# Patient Record
Sex: Male | Born: 1945 | Race: White | Hispanic: No | Marital: Married | Smoking: Never smoker
Health system: Southern US, Community
[De-identification: ages and names within clinical notes are randomized; demographics above are authoritative.]

## PROBLEM LIST (undated history)

## (undated) DIAGNOSIS — I10 Essential (primary) hypertension: Secondary | ICD-10-CM

## (undated) HISTORY — DX: Essential (primary) hypertension: I10

## (undated) HISTORY — PX: TONSILLECTOMY AND ADENOIDECTOMY: SHX28

---

## 2013-03-12 ENCOUNTER — Encounter: Payer: Self-pay | Admitting: General Practice

## 2013-03-12 ENCOUNTER — Ambulatory Visit (INDEPENDENT_AMBULATORY_CARE_PROVIDER_SITE_OTHER): Payer: Medicare Other | Admitting: General Practice

## 2013-03-12 VITALS — BP 119/75 | HR 99 | Temp 100.6°F | Ht 69.0 in | Wt 176.0 lb

## 2013-03-12 DIAGNOSIS — A088 Other specified intestinal infections: Secondary | ICD-10-CM | POA: Diagnosis not present

## 2013-03-12 DIAGNOSIS — A084 Viral intestinal infection, unspecified: Secondary | ICD-10-CM

## 2013-03-12 NOTE — Patient Instructions (Addendum)
Viral Gastroenteritis Viral gastroenteritis is also known as stomach flu. This condition affects the stomach and intestinal tract. It can cause sudden diarrhea and vomiting. The illness typically lasts 3 to 8 days. Most people develop an immune response that eventually gets rid of the virus. While this natural response develops, the virus can make you quite ill. CAUSES  Many different viruses can cause gastroenteritis, such as rotavirus or noroviruses. You can catch one of these viruses by consuming contaminated food or water. You may also catch a virus by sharing utensils or other personal items with an infected person or by touching a contaminated surface. SYMPTOMS  The most common symptoms are diarrhea and vomiting. These problems can cause a severe loss of body fluids (dehydration) and a body salt (electrolyte) imbalance. Other symptoms may include:  Fever.  Headache.  Fatigue.  Abdominal pain. DIAGNOSIS  Your caregiver can usually diagnose viral gastroenteritis based on your symptoms and a physical exam. A stool sample may also be taken to test for the presence of viruses or other infections. TREATMENT  This illness typically goes away on its own. Treatments are aimed at rehydration. The most serious cases of viral gastroenteritis involve vomiting so severely that you are not able to keep fluids down. In these cases, fluids must be given through an intravenous line (IV). HOME CARE INSTRUCTIONS   Drink enough fluids to keep your urine clear or pale yellow. Drink small amounts of fluids frequently and increase the amounts as tolerated.  Ask your caregiver for specific rehydration instructions.  Avoid:  Foods high in sugar.  Alcohol.  Carbonated drinks.  Tobacco.  Juice.  Caffeine drinks.  Extremely hot or cold fluids.  Fatty, greasy foods.  Too much intake of anything at one time.  Dairy products until 24 to 48 hours after diarrhea stops.  You may consume probiotics.  Probiotics are active cultures of beneficial bacteria. They may lessen the amount and number of diarrheal stools in adults. Probiotics can be found in yogurt with active cultures and in supplements.  Wash your hands well to avoid spreading the virus.  Only take over-the-counter or prescription medicines for pain, discomfort, or fever as directed by your caregiver. Do not give aspirin to children. Antidiarrheal medicines are not recommended.  Ask your caregiver if you should continue to take your regular prescribed and over-the-counter medicines.  Keep all follow-up appointments as directed by your caregiver. SEEK IMMEDIATE MEDICAL CARE IF:   You are unable to keep fluids down.  You do not urinate at least once every 6 to 8 hours.  You develop shortness of breath.  You notice blood in your stool or vomit. This may look like coffee grounds.  You have abdominal pain that increases or is concentrated in one small area (localized).  You have persistent vomiting or diarrhea.  You have a fever.  The patient is a child younger than 3 months, and he or she has a fever.  The patient is a child older than 3 months, and he or she has a fever and persistent symptoms.  The patient is a child older than 3 months, and he or she has a fever and symptoms suddenly get worse.  The patient is a baby, and he or she has no tears when crying. MAKE SURE YOU:   Understand these instructions.  Will watch your condition.  Will get help right away if you are not doing well or get worse. Document Released: 11/20/2005 Document Revised: 02/12/2012 Document Reviewed: 09/06/2011   ExitCare Patient Information 2013 [employer].  

## 2013-03-12 NOTE — Progress Notes (Signed)
  Subjective:    Patient ID: Daniel Osborne, male    DOB: [birth year], 67 y.o.   MRN: [id number]  Diarrhea  This is a new problem. The current episode started yesterday. The problem occurs 2 to 4 times per day. The problem has been unchanged. The stool consistency is described as watery. The patient states that diarrhea awakens him from sleep. Associated symptoms include chills. Pertinent negatives include no abdominal pain, fever, headaches, increased  flatus, myalgias, sweats or vomiting. Nothing aggravates the symptoms. Risk factors: neighbor has been ill with same symptoms, patient recently diagnosed with leukemia. He has tried nothing for the symptoms. There is no history of bowel resection, inflammatory bowel disease, irritable bowel syndrome or malabsorption.      Review of Systems  Constitutional: Positive for chills. Negative for fever.  Respiratory: Negative for chest tightness and shortness of breath.   Cardiovascular: Negative for chest pain and palpitations.  Gastrointestinal: Positive for diarrhea. Negative for vomiting, abdominal pain, constipation, blood in stool, abdominal distention and flatus.  Genitourinary: Negative for difficulty urinating.  Musculoskeletal: Negative.  Negative for myalgias.  Skin: Negative.   Neurological: Negative for dizziness and headaches.  Psychiatric/Behavioral: Negative.        Objective:   Physical Exam  Constitutional: He is oriented to person, place, and time. He appears well-developed and well-nourished. He appears distressed.  Cardiovascular: Normal rate, regular rhythm and normal heart sounds.   No murmur heard. Pulmonary/Chest: Effort normal and breath sounds normal. No respiratory distress. He exhibits no tenderness.  Abdominal: Soft. He exhibits no distension. Bowel sounds are increased. There is no tenderness. There is no rebound.  Neurological: He is alert and oriented to person, place, and time.  Skin: Skin is warm and dry.   Psychiatric: He has a normal mood and affect.          Assessment & Plan:  Discussed bland diet OTC imodium if diarrhea continues Increase fluid intake  Proper handwashing RTO if diarrhea continues more than 2 days Patient verbalized understanding and denies questions   Raymon Mutton, FNP-C

## 2013-07-07 ENCOUNTER — Telehealth: Payer: Self-pay | Admitting: *Deleted

## 2013-07-07 NOTE — Telephone Encounter (Signed)
Patient last seen in office on 03-22-13 for an acute visit. Last visit before that 2012. Please advise

## 2013-07-07 NOTE — Telephone Encounter (Signed)
I do not understand what this message  is about. Can you clarify this question.

## 2013-07-08 ENCOUNTER — Other Ambulatory Visit: Payer: Self-pay | Admitting: *Deleted

## 2013-07-08 MED ORDER — LISINOPRIL 10 MG PO TABS: 10.0000 mg | ORAL_TABLET | Freq: Every day | ORAL | Status: DC

## 2013-07-08 NOTE — Telephone Encounter (Signed)
Patient last seen in office on 03-22-13 for an acute visit. Last visit before that 2012. Please advise 

## 2013-07-09 NOTE — Telephone Encounter (Signed)
Resent message with correct info for rx refill

## 2013-10-09 ENCOUNTER — Other Ambulatory Visit: Payer: Self-pay | Admitting: Family Medicine

## 2014-07-15 DIAGNOSIS — M79609 Pain in unspecified limb: Secondary | ICD-10-CM | POA: Diagnosis not present

## 2014-07-16 DIAGNOSIS — S8990XA Unspecified injury of unspecified lower leg, initial encounter: Secondary | ICD-10-CM | POA: Diagnosis not present

## 2014-07-16 DIAGNOSIS — M79609 Pain in unspecified limb: Secondary | ICD-10-CM | POA: Diagnosis not present

## 2014-07-16 DIAGNOSIS — S99919A Unspecified injury of unspecified ankle, initial encounter: Secondary | ICD-10-CM | POA: Diagnosis not present

## 2014-07-16 DIAGNOSIS — M25569 Pain in unspecified knee: Secondary | ICD-10-CM | POA: Diagnosis not present

## 2014-08-19 DIAGNOSIS — H60399 Other infective otitis externa, unspecified ear: Secondary | ICD-10-CM | POA: Diagnosis not present

## 2015-04-10 DIAGNOSIS — L02419 Cutaneous abscess of limb, unspecified: Secondary | ICD-10-CM | POA: Diagnosis not present

## 2015-04-10 DIAGNOSIS — L0390 Cellulitis, unspecified: Secondary | ICD-10-CM | POA: Diagnosis not present

## 2015-11-19 ENCOUNTER — Encounter: Payer: Self-pay | Admitting: Family

## 2015-11-19 ENCOUNTER — Ambulatory Visit (INDEPENDENT_AMBULATORY_CARE_PROVIDER_SITE_OTHER): Payer: Medicare Other | Admitting: Family

## 2015-11-19 VITALS — BP 140/88 | HR 88 | Temp 97.7°F | Ht 69.0 in | Wt 185.0 lb

## 2015-11-19 DIAGNOSIS — J0100 Acute maxillary sinusitis, unspecified: Secondary | ICD-10-CM | POA: Diagnosis not present

## 2015-11-19 MED ORDER — AMOXICILLIN-POT CLAVULANATE 875-125 MG PO TABS: 1.0000 | ORAL_TABLET | Freq: Two times a day (BID) | ORAL | Status: DC

## 2015-11-19 MED ORDER — FLUTICASONE PROPIONATE 50 MCG/ACT NA SUSP: 2.0000 | Freq: Every day | NASAL | Status: DC

## 2015-11-19 NOTE — Patient Instructions (Signed)
Sinusitis, Adult Sinusitis is redness, soreness, and inflammation of the paranasal sinuses. Paranasal sinuses are air pockets within the bones of your face. They are located beneath your eyes, in the middle of your forehead, and above your eyes. In healthy paranasal sinuses, mucus is able to drain out, and air is able to circulate through them by way of your nose. However, when your paranasal sinuses are inflamed, mucus and air can become trapped. This can allow bacteria and other germs to grow and cause infection. Sinusitis can develop quickly and last only a short time (acute) or continue over a long period (chronic). Sinusitis that lasts for more than 12 weeks is considered chronic. CAUSES Causes of sinusitis include:  Allergies.  Structural abnormalities, such as displacement of the cartilage that separates your nostrils (deviated septum), which can decrease the air flow through your nose and sinuses and affect sinus drainage.  Functional abnormalities, such as when the small hairs (cilia) that line your sinuses and help remove mucus do not work properly or are not present. SIGNS AND SYMPTOMS Symptoms of acute and chronic sinusitis are the same. The primary symptoms are pain and pressure around the affected sinuses. Other symptoms include:  Upper toothache.  Earache.  Headache.  Bad breath.  Decreased sense of smell and taste.  A cough, which worsens when you are lying flat.  Fatigue.  Fever.  Thick drainage from your nose, which often is green and may contain pus (purulent).  Swelling and warmth over the affected sinuses. DIAGNOSIS Your health care provider will perform a physical exam. During your exam, your health care provider may perform any of the following to help determine if you have acute sinusitis or chronic sinusitis:  Look in your nose for signs of abnormal growths in your nostrils (nasal polyps).  Tap over the affected sinus to check for signs of  infection.  View the inside of your sinuses using an imaging device that has a light attached (endoscope). If your health care provider suspects that you have chronic sinusitis, one or more of the following tests may be recommended:  Allergy tests.  Nasal culture. A sample of mucus is taken from your nose, sent to a lab, and screened for bacteria.  Nasal cytology. A sample of mucus is taken from your nose and examined by your health care provider to determine if your sinusitis is related to an allergy. TREATMENT Most cases of acute sinusitis are related to a viral infection and will resolve on their own within 10 days. Sometimes, medicines are prescribed to help relieve symptoms of both acute and chronic sinusitis. These may include pain medicines, decongestants, nasal steroid sprays, or saline sprays. However, for sinusitis related to a bacterial infection, your health care provider will prescribe antibiotic medicines. These are medicines that will help kill the bacteria causing the infection. Rarely, sinusitis is caused by a fungal infection. In these cases, your health care provider will prescribe antifungal medicine. For some cases of chronic sinusitis, surgery is needed. Generally, these are cases in which sinusitis recurs more than 3 times per year, despite other treatments. HOME CARE INSTRUCTIONS  Drink plenty of water. Water helps thin the mucus so your sinuses can drain more easily.  Use a humidifier.  Inhale steam 3-4 times a day (for example, sit in the bathroom with the shower running).  Apply a warm, moist washcloth to your face 3-4 times a day, or as directed by your health care provider.  Use saline nasal sprays to help   moisten and clean your sinuses.  Take medicines only as directed by your health care provider.  If you were prescribed either an antibiotic or antifungal medicine, finish it all even if you start to feel better. SEEK IMMEDIATE MEDICAL CARE IF:  You have  increasing pain or severe headaches.  You have nausea, vomiting, or drowsiness.  You have swelling around your face.  You have vision problems.  You have a stiff neck.  You have difficulty breathing.   This information is not intended to replace advice given to you by your health care provider. Make sure you discuss any questions you have with your health care provider.   Document Released: 11/20/2005 Document Revised: 12/11/2014 Document Reviewed: 12/05/2011 Elsevier Interactive Patient Education 2016 [employer]  - Take meds as prescribed - Use a cool mist humidifier  -Use saline nose sprays frequently -Saline irrigations of the nose can be very helpful if done frequently.  * 4X daily for 1 week*  * Use of a nettie pot can be helpful with this. Follow directions with this* -Force fluids -For any cough or congestion  Use plain Mucinex- regular strength or max strength is fine   * Children- consult with Pharmacist for dosing -For fever or aces or pains- take tylenol or ibuprofen appropriate for age and weight.  * for fevers greater than 101 orally you may alternate ibuprofen and tylenol every  3 hours. -Throat lozenges if help   Haruko Mersch, FNP   

## 2015-11-19 NOTE — Progress Notes (Signed)
Subjective:    Patient ID: Daniel Osborne, male    DOB: 1946-07-23, 69 y.o.   MRN: [id number]  Sinus Problem This is a new problem. The current episode started in the past 7 days. The problem has been gradually worsening since onset. There has been no fever. His pain is at a severity of 5/10. The pain is moderate. Associated symptoms include congestion, a hoarse voice, sinus pressure and a sore throat. Pertinent negatives include no chills, coughing, ear pain, neck pain, shortness of breath, sneezing or swollen glands. Past treatments include oral decongestants. The treatment provided mild relief.      Review of Systems  Constitutional: Negative.  Negative for chills.  HENT: Positive for congestion, hoarse voice, sinus pressure and sore throat. Negative for ear pain and sneezing.   Respiratory: Negative.  Negative for cough and shortness of breath.   Cardiovascular: Negative.   Gastrointestinal: Negative.   Endocrine: Negative.   Genitourinary: Negative.   Musculoskeletal: Negative.  Negative for neck pain.  Neurological: Negative.   Hematological: Negative.   Psychiatric/Behavioral: Negative.   All other systems reviewed and are negative.      Objective:   Physical Exam  Constitutional: He is oriented to person, place, and time. He appears well-developed and well-nourished. No distress.  HENT:  Head: Normocephalic.  Right Ear: External ear normal.  Left Ear: External ear normal.  Nose: Right sinus exhibits maxillary sinus tenderness. Left sinus exhibits maxillary sinus tenderness.  Nasal passage erythemas with moderate swelling  Oropharynx erythemas     Eyes: Pupils are equal, round, and reactive to light. Right eye exhibits no discharge. Left eye exhibits no discharge.  Neck: Normal range of motion. Neck supple. No thyromegaly present.  Cardiovascular: Normal rate, regular rhythm, normal heart sounds and intact distal pulses.   No murmur heard. Pulmonary/Chest: Effort  normal and breath sounds normal. No respiratory distress. He has no wheezes.  Abdominal: Soft. Bowel sounds are normal. He exhibits no distension. There is no tenderness.  Musculoskeletal: Normal range of motion. He exhibits no edema or tenderness.  Neurological: He is alert and oriented to person, place, and time. He has normal reflexes. No cranial nerve deficit.  Skin: Skin is warm and dry. No rash noted. No erythema.  Psychiatric: He has a normal mood and affect. His behavior is normal. Judgment and thought content normal.  Vitals reviewed.     BP 140/88 mmHg  Pulse 88  Temp(Src) 97.7 F (36.5 C) (Oral)  Ht 5\' 9"  (1.753 m)  Wt 185 lb (83.915 kg)  BMI 27.31 kg/m2     Assessment & Plan:  1. Acute maxillary sinusitis, recurrence not specified -- Take meds as prescribed - Use a cool mist humidifier  -Use saline nose sprays frequently -Saline irrigations of the nose can be very helpful if done frequently.  * 4X daily for 1 week*  * Use of a nettie pot can be helpful with this. Follow directions with this* -Force fluids -For any cough or congestion  Use plain Mucinex- regular strength or max strength is fine   * Children- consult with Pharmacist for dosing -For fever or aces or pains- take tylenol or ibuprofen appropriate for age and weight.  * for fevers greater than 101 orally you may alternate ibuprofen and tylenol every  3 hours. -Throat lozenges if help - amoxicillin-clavulanate (AUGMENTIN) 875-125 MG tablet; Take 1 tablet by mouth 2 (two) times daily.  Dispense: 14 tablet; Refill: 0 - fluticasone (FLONASE) 50 MCG/ACT nasal  spray; Place 2 sprays into both nostrils daily.  Dispense: 16 g; Refill: 6  Pt goes to [place] for chronic follow up and lab work.  Evelina Dun, FNP

## 2015-11-22 ENCOUNTER — Telehealth: Payer: Self-pay | Admitting: Family Medicine

## 2015-11-22 NOTE — Telephone Encounter (Signed)
HAD COLONSCOPY AT [facility] SENDING PT A RELEASE TO SIGN

## 2015-12-07 ENCOUNTER — Other Ambulatory Visit: Payer: Self-pay | Admitting: Family

## 2016-01-14 ENCOUNTER — Other Ambulatory Visit: Payer: Self-pay | Admitting: Family

## 2016-01-17 NOTE — Telephone Encounter (Signed)
Last seen 12./16/16  [facility]

## 2016-02-11 ENCOUNTER — Encounter: Payer: Self-pay | Admitting: *Deleted

## 2016-12-23 DIAGNOSIS — R739 Hyperglycemia, unspecified: Secondary | ICD-10-CM | POA: Diagnosis not present

## 2016-12-23 DIAGNOSIS — Z791 Long term (current) use of non-steroidal anti-inflammatories (NSAID): Secondary | ICD-10-CM | POA: Diagnosis not present

## 2016-12-23 DIAGNOSIS — M7989 Other specified soft tissue disorders: Secondary | ICD-10-CM | POA: Diagnosis not present

## 2016-12-23 DIAGNOSIS — C9110 Chronic lymphocytic leukemia of B-cell type not having achieved remission: Secondary | ICD-10-CM | POA: Diagnosis not present

## 2016-12-23 DIAGNOSIS — L03115 Cellulitis of right lower limb: Secondary | ICD-10-CM | POA: Diagnosis not present

## 2017-01-15 DIAGNOSIS — J0100 Acute maxillary sinusitis, unspecified: Secondary | ICD-10-CM | POA: Diagnosis not present

## 2017-11-19 DIAGNOSIS — L03115 Cellulitis of right lower limb: Secondary | ICD-10-CM | POA: Diagnosis not present

## 2017-11-19 DIAGNOSIS — R21 Rash and other nonspecific skin eruption: Secondary | ICD-10-CM | POA: Diagnosis not present

## 2017-11-19 DIAGNOSIS — L509 Urticaria, unspecified: Secondary | ICD-10-CM | POA: Diagnosis not present

## 2017-11-19 DIAGNOSIS — R600 Localized edema: Secondary | ICD-10-CM | POA: Diagnosis not present

## 2020-05-19 DIAGNOSIS — R197 Diarrhea, unspecified: Secondary | ICD-10-CM | POA: Diagnosis not present

## 2020-05-19 DIAGNOSIS — H60399 Other infective otitis externa, unspecified ear: Secondary | ICD-10-CM | POA: Insufficient documentation

## 2020-09-06 DIAGNOSIS — L0291 Cutaneous abscess, unspecified: Secondary | ICD-10-CM | POA: Diagnosis not present

## 2020-12-29 DIAGNOSIS — Z23 Encounter for immunization: Secondary | ICD-10-CM | POA: Diagnosis not present

## 2021-06-03 DEATH — deceased

## 2021-09-12 DIAGNOSIS — K529 Noninfective gastroenteritis and colitis, unspecified: Secondary | ICD-10-CM | POA: Diagnosis not present

## 2021-09-12 DIAGNOSIS — R197 Diarrhea, unspecified: Secondary | ICD-10-CM | POA: Diagnosis not present

## 2022-01-09 ENCOUNTER — Emergency Department (HOSPITAL_COMMUNITY): Payer: Medicare Other

## 2022-01-09 ENCOUNTER — Inpatient Hospital Stay (HOSPITAL_COMMUNITY)
Admission: EM | Admit: 2022-01-09 | Discharge: 2022-01-13 | DRG: 035 | Disposition: A | Discharge status: Home / Self Care | Payer: Medicare Other | Attending: Internal Medicine | Admitting: Internal Medicine

## 2022-01-09 ENCOUNTER — Encounter (HOSPITAL_COMMUNITY): Payer: Self-pay | Admitting: *Deleted

## 2022-01-09 DIAGNOSIS — G8194 Hemiplegia, unspecified affecting left nondominant side: Secondary | ICD-10-CM | POA: Diagnosis present

## 2022-01-09 DIAGNOSIS — I10 Essential (primary) hypertension: Secondary | ICD-10-CM | POA: Diagnosis not present

## 2022-01-09 DIAGNOSIS — Z66 Do not resuscitate: Secondary | ICD-10-CM | POA: Diagnosis present

## 2022-01-09 DIAGNOSIS — I63233 Cerebral infarction due to unspecified occlusion or stenosis of bilateral carotid arteries: Principal | ICD-10-CM | POA: Diagnosis present

## 2022-01-09 DIAGNOSIS — Z20822 Contact with and (suspected) exposure to covid-19: Secondary | ICD-10-CM | POA: Diagnosis present

## 2022-01-09 DIAGNOSIS — D649 Anemia, unspecified: Secondary | ICD-10-CM | POA: Diagnosis not present

## 2022-01-09 DIAGNOSIS — R471 Dysarthria and anarthria: Secondary | ICD-10-CM | POA: Diagnosis not present

## 2022-01-09 DIAGNOSIS — Z806 Family history of leukemia: Secondary | ICD-10-CM | POA: Diagnosis not present

## 2022-01-09 DIAGNOSIS — D72829 Elevated white blood cell count, unspecified: Secondary | ICD-10-CM | POA: Diagnosis present

## 2022-01-09 DIAGNOSIS — I6320 Cerebral infarction due to unspecified occlusion or stenosis of unspecified precerebral arteries: Secondary | ICD-10-CM

## 2022-01-09 DIAGNOSIS — I6623 Occlusion and stenosis of bilateral posterior cerebral arteries: Secondary | ICD-10-CM | POA: Diagnosis not present

## 2022-01-09 DIAGNOSIS — I63232 Cerebral infarction due to unspecified occlusion or stenosis of left carotid arteries: Secondary | ICD-10-CM | POA: Diagnosis not present

## 2022-01-09 DIAGNOSIS — I6782 Cerebral ischemia: Secondary | ICD-10-CM | POA: Diagnosis not present

## 2022-01-09 DIAGNOSIS — R29818 Other symptoms and signs involving the nervous system: Secondary | ICD-10-CM | POA: Diagnosis not present

## 2022-01-09 DIAGNOSIS — C9510 Chronic leukemia of unspecified cell type not having achieved remission: Secondary | ICD-10-CM | POA: Diagnosis present

## 2022-01-09 DIAGNOSIS — R29810 Facial weakness: Secondary | ICD-10-CM | POA: Diagnosis present

## 2022-01-09 DIAGNOSIS — I639 Cerebral infarction, unspecified: Secondary | ICD-10-CM | POA: Diagnosis present

## 2022-01-09 DIAGNOSIS — I6389 Other cerebral infarction: Secondary | ICD-10-CM | POA: Diagnosis not present

## 2022-01-09 DIAGNOSIS — R531 Weakness: Secondary | ICD-10-CM | POA: Diagnosis present

## 2022-01-09 DIAGNOSIS — I6523 Occlusion and stenosis of bilateral carotid arteries: Secondary | ICD-10-CM | POA: Diagnosis not present

## 2022-01-09 DIAGNOSIS — I6522 Occlusion and stenosis of left carotid artery: Secondary | ICD-10-CM | POA: Diagnosis not present

## 2022-01-09 DIAGNOSIS — I454 Nonspecific intraventricular block: Secondary | ICD-10-CM | POA: Diagnosis not present

## 2022-01-09 DIAGNOSIS — E785 Hyperlipidemia, unspecified: Secondary | ICD-10-CM | POA: Diagnosis not present

## 2022-01-09 DIAGNOSIS — E876 Hypokalemia: Secondary | ICD-10-CM | POA: Diagnosis present

## 2022-01-09 DIAGNOSIS — I6603 Occlusion and stenosis of bilateral middle cerebral arteries: Secondary | ICD-10-CM | POA: Diagnosis not present

## 2022-01-09 DIAGNOSIS — Z823 Family history of stroke: Secondary | ICD-10-CM | POA: Diagnosis not present

## 2022-01-09 DIAGNOSIS — I6521 Occlusion and stenosis of right carotid artery: Secondary | ICD-10-CM | POA: Diagnosis not present

## 2022-01-09 DIAGNOSIS — C9110 Chronic lymphocytic leukemia of B-cell type not having achieved remission: Secondary | ICD-10-CM | POA: Diagnosis not present

## 2022-01-09 DIAGNOSIS — I63231 Cerebral infarction due to unspecified occlusion or stenosis of right carotid arteries: Secondary | ICD-10-CM | POA: Diagnosis not present

## 2022-01-09 DIAGNOSIS — I6501 Occlusion and stenosis of right vertebral artery: Secondary | ICD-10-CM | POA: Diagnosis not present

## 2022-01-09 DIAGNOSIS — I6503 Occlusion and stenosis of bilateral vertebral arteries: Secondary | ICD-10-CM | POA: Diagnosis not present

## 2022-01-09 LAB — CBC WITH DIFFERENTIAL/PLATELET
Abs Immature Granulocytes: 0.06 10*3/uL (ref 0.00–0.07)
Basophils Absolute: 0 10*3/uL (ref 0.0–0.1)
Basophils Relative: 0 %
Eosinophils Absolute: 0.3 10*3/uL (ref 0.0–0.5)
Eosinophils Relative: 1 %
HCT: 41.5 % (ref 39.0–52.0)
Hemoglobin: 13 g/dL (ref 13.0–17.0)
Immature Granulocytes: 0 %
Lymphocytes Relative: 87 %
Lymphs Abs: 35 10*3/uL — ABNORMAL HIGH (ref 0.7–4.0)
MCH: 30.4 pg (ref 26.0–34.0)
MCHC: 31.3 g/dL (ref 30.0–36.0)
MCV: 97.2 fL (ref 80.0–100.0)
Monocytes Absolute: 0.9 10*3/uL (ref 0.1–1.0)
Monocytes Relative: 2 %
Neutro Abs: 3.9 10*3/uL (ref 1.7–7.7)
Neutrophils Relative %: 10 %
Platelets: 182 10*3/uL (ref 150–400)
RBC: 4.27 MIL/uL (ref 4.22–5.81)
RDW: 14.9 % (ref 11.5–15.5)
WBC: 40 10*3/uL — ABNORMAL HIGH (ref 4.0–10.5)
nRBC: 0 % (ref 0.0–0.2)

## 2022-01-09 LAB — RESP PANEL BY RT-PCR (FLU A&B, COVID) ARPGX2
Influenza A by PCR: NEGATIVE
Influenza B by PCR: NEGATIVE
SARS Coronavirus 2 by RT PCR: NEGATIVE

## 2022-01-09 LAB — BASIC METABOLIC PANEL
Anion gap: 7 (ref 5–15)
BUN: 22 mg/dL (ref 8–23)
CO2: 29 mmol/L (ref 22–32)
Calcium: 9.4 mg/dL (ref 8.9–10.3)
Chloride: 104 mmol/L (ref 98–111)
Creatinine, Ser: 1.28 mg/dL — ABNORMAL HIGH (ref 0.61–1.24)
GFR, Estimated: 59 mL/min — ABNORMAL LOW (ref >60)
Glucose, Bld: 88 mg/dL (ref 70–99)
Potassium: 3.4 mmol/L — ABNORMAL LOW (ref 3.5–5.1)
Sodium: 140 mmol/L (ref 135–145)

## 2022-01-09 LAB — PROTIME-INR
INR: 1 (ref 0.8–1.2)
Prothrombin Time: 13.3 seconds (ref 11.4–15.2)

## 2022-01-09 MED ORDER — IOHEXOL 350 MG/ML SOLN
100.0000 mL | Freq: Once | INTRAVENOUS | Status: AC | PRN
  Administered 2022-01-09: 75 mL via INTRAVENOUS

## 2022-01-09 MED ORDER — ASPIRIN 325 MG PO TABS
650.0000 mg | ORAL_TABLET | Freq: Once | ORAL | Status: AC
  Administered 2022-01-09: 650 mg via ORAL
  Filled 2022-01-09: qty 2

## 2022-01-09 MED ORDER — STROKE: EARLY STAGES OF RECOVERY BOOK
Freq: Once | Status: AC
  Filled 2022-01-09 (×2): qty 1

## 2022-01-09 MED ORDER — ACETAMINOPHEN 325 MG PO TABS
650.0000 mg | ORAL_TABLET | ORAL | Status: DC | PRN
  Administered 2022-01-10 – 2022-01-12 (×3): 650 mg via ORAL
  Filled 2022-01-09 (×3): qty 2

## 2022-01-09 MED ORDER — SENNOSIDES-DOCUSATE SODIUM 8.6-50 MG PO TABS
1.0000 | ORAL_TABLET | Freq: Every evening | ORAL | Status: DC | PRN
Start: 2022-01-09 — End: 2022-01-13

## 2022-01-09 MED ORDER — ASPIRIN EC 81 MG PO TBEC
81.0000 mg | DELAYED_RELEASE_TABLET | Freq: Every day | ORAL | Status: DC
  Administered 2022-01-10 – 2022-01-12 (×3): 81 mg via ORAL
  Filled 2022-01-09 (×3): qty 1

## 2022-01-09 MED ORDER — HEPARIN SODIUM (PORCINE) 5000 UNIT/ML IJ SOLN
5000.0000 [IU] | Freq: Three times a day (TID) | INTRAMUSCULAR | Status: DC
  Administered 2022-01-10 – 2022-01-13 (×11): 5000 [IU] via SUBCUTANEOUS
  Filled 2022-01-09 (×11): qty 1

## 2022-01-09 MED ORDER — ACETAMINOPHEN 650 MG RE SUPP: 650.0000 mg | RECTAL | Status: DC | PRN

## 2022-01-09 MED ORDER — ACETAMINOPHEN 160 MG/5ML PO SOLN: 650.0000 mg | ORAL | Status: DC | PRN

## 2022-01-09 MED ORDER — ONDANSETRON HCL 4 MG/2ML IJ SOLN: 4.0000 mg | Freq: Four times a day (QID) | INTRAMUSCULAR | Status: DC | PRN

## 2022-01-09 MED ORDER — POTASSIUM CHLORIDE 20 MEQ PO PACK
20.0000 meq | PACK | Freq: Once | ORAL | Status: AC
  Administered 2022-01-09: 20 meq via ORAL
  Filled 2022-01-09: qty 1

## 2022-01-09 NOTE — Assessment & Plan Note (Addendum)
Holding lisinopril for permissive hypertension -IV labetalol as needed

## 2022-01-09 NOTE — Assessment & Plan Note (Addendum)
-   Acute CVA--POA -MRI Brain shows Small acute cortical/subcortical right frontal infarcts with involvement of lateral precentral gyrus. 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 CTA shows multiple intracranial vascular pathologies and carotid pathology as well  -Speech disturbance mostly resolved, patient has residual mild  left facial droop -Echo with EF of 60 to 97%, grade 1 diastolic dysfunction Awaiting speech, physical therapy, Occupational Therapy consult -Patient will need ongoing monitoring on telemetry -LDL 158, HDL 36 -Hemoglobin A1c of 6 -Currently on aspirin, Brilinta, Lipitor 80 mg -IR consulted for consideration of cerebral angiogram and potential left ICA stenting

## 2022-01-09 NOTE — Assessment & Plan Note (Addendum)
Supplemented and corrected

## 2022-01-09 NOTE — Assessment & Plan Note (Addendum)
-   Has history of chronic leukemia and chronic leukocytosis. -We recommend to follow-up with his oncologist as an outpatient

## 2022-01-09 NOTE — H&P (Signed)
History and Physical    Patient: Daniel Osborne DOB: June 10, 1946 DOA: 01/09/2022 DOS: the patient was seen and examined on 01/09/2022 PCP: [facility]  Patient coming from: Home  Chief Complaint:  Chief Complaint  Patient presents with   Facial Droop    HPI: Daniel Osborne is a 76 y.o. 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  Patient reports that he woke up at 5 AM and was at his normal state of health.  At 10 AM he started having slurred speech.  He tried to take a drink in the liquid dribbled out of the left corner of his mouth.  He reports the symptoms to be intermittent, and no worsening.  He denies any headache, weakness, change in vision, chest pain, palpitations, shortness of breath, numbness or any other symptoms associated.  Patient reports he has never had a stroke.  During my exam patient's speech is not slurred, but he reports minimal come back in unpredictably.  Patient has no other complaints at this time.  Patient does not smoke, does not drink alcohol, does not use illicit drugs.  Patient is vaccinated for COVID.  Patient is DNR.    Review of Systems: As mentioned in the history of present illness. All other systems reviewed and are negative. Past Medical History:  Diagnosis Date   Chronic Leukemia February 2014   Hypertension    Past Surgical History:  Procedure Laterality Date   TONSILLECTOMY AND ADENOIDECTOMY     Social History:  reports that he has never smoked. He has never used smokeless tobacco. He reports that he does not drink alcohol and does not use drugs.  No Known Allergies  Family History  Problem Relation Age of Onset   Stroke Mother    Cancer Father        leukemia   Cancer Sister        breast    Prior to Admission medications   Medication Sig  Start Date End Date Taking? Authorizing Provider  acetaminophen (TYLENOL) 500 MG tablet Take 500 mg by mouth every 6 (six) hours as needed.   Yes [provider]  lisinopril (ZESTRIL) 10 MG tablet Take 5 mg by mouth daily.   Yes [provider]  amoxicillin-clavulanate (AUGMENTIN) 875-125 MG tablet TAKE 1 TABLET BY MOUTH 2 (TWO) TIMES DAILY. Patient not taking: Reported on 01/09/2022 12/07/15   Claretta Fraise, MD  fluticasone [facility]) 50 MCG/ACT nasal spray Place 2 sprays into both nostrils daily. Patient not taking: Reported on 01/09/2022 11/19/15   Sharion Balloon, FNP    Physical Exam: Vitals:   01/09/22 2000 01/09/22 2030 01/09/22 2100 01/09/22 2230  BP: (!) 180/97 (!) 195/84 (!) 182/97 (!) 172/96  Pulse: 91 94 96 86  Resp: (!) 21 19 16 20   Temp:      TempSrc:      SpO2: 97% 98% 94% 96%  Weight:       1.  General: Patient lying supine in bed,  no acute distress   2. Psychiatric: Alert and oriented x 3, mood and behavior normal for situation, pleasant and cooperative with exam   3. Neurologic: Speech and language are normal, slight left facial droop, moves all 4 extremities voluntarily, finger-nose intact,  pupils reactive   4. HEENMT:  Head is atraumatic, normocephalic, pupils reactive to light, neck  is supple, trachea is midline, mucous membranes are moist,   5. Respiratory : Lungs are clear to auscultation bilaterally without wheezing, rhonchi, rales, no cyanosis, no increase in work of breathing or accessory muscle use   6. Cardiovascular : Heart rate normal, rhythm is regular, systolic murmur present, rubs or gallops, no peripheral edema, peripheral pulses palpated   7. Gastrointestinal:  Abdomen is soft, nondistended, nontender to palpation bowel sounds active, no masses or organomegaly palpated   8. Skin:  Skin is warm, dry and intact without rashes, acute lesions, or ulcers on limited exam   9.Musculoskeletal:  No acute deformities or trauma, no  asymmetry in tone, no peripheral edema, peripheral pulses palpated, no tenderness to palpation in the extremities   Data Reviewed: In the ED Temp 98.8, heart rate 73-1 01, respiratory rate 18-21, blood pressure 194/84, satting at 98% Patient does have a leukocytosis at 40.0 with chronic leukemia, hemoglobin 13.0 Slight hypokalemia at 3.4 Creatinine at 1.28 with note baseline to compare Respiratory panel negative CT head shows patchy hypoattenuation in the right frontal white matter and bilateral basal ganglia which could represent chronic microvascular ischemic disease versus age-indeterminate infarcts, no acute hemorrhage CTA shows multiple areas of concern including 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  Moderate right P1 PCA stenosis.  Severe right vertebral artery origin stenosis also severe right and moderate left distal intradural vertebral artery stenoses, approximately 70-80% stenosis of the proximal left ICA, multifocal mild to moderate bilateral M1 and M2 MCA stenosis, and increased lymph nodes consistent with chronic leukemia EKG shows a heart rate of 83, sinus rhythm with a QTc of 451 and a bifascicular block Neurology was consulted recommended aspirin 650, permissive hypertension, admit to [facility] with frequent neurochecks and if the neuro exam worsens may consider intervention tonight   Assessment and Plan: * CVA (cerebral vascular accident) ([place])- (present on admission) At time of my exam patient is back to baseline except for a slight left facial droop CTA shows multiple intracranial vascular pathologies and carotid pathology as well Frequent neurochecks Transfer to [facility] per Dr. Cheral Marker Echo in the a.m. Speech, physical therapy, Occupational Therapy  consult Monitor on telemetry   Leukocytosis- (present on admission) Consistent with chronic leukemia Trend in the a.m.  Essential hypertension- (present on admission) Holding lisinopril for permissive hypertension Treat for systolic above 546 or diastolic above 568  Hypokalemia- (present on admission) Mild hypokalemia 3.4 20 mEq of potassium given Recheck in the a.m.       Advance Care Planning:   Code Status: Not on file DNR  Consults: Dr. Cheral Marker  Family Communication: No family at bedside  Severity of Illness: The appropriate patient status for this patient is OBSERVATION. Observation status is judged to be reasonable and necessary in order to provide the required intensity of service to ensure the patient's safety. The patient's presenting symptoms, physical exam findings, and initial radiographic and laboratory data in the context of their medical condition is felt to place them at decreased risk for further clinical deterioration. Furthermore, it is anticipated that the patient will be medically stable for discharge from the hospital within 2 midnights of admission.   Author: Rolla Plate, DO 01/09/2022 11:03 PM  For on call review [email or web address].

## 2022-01-09 NOTE — ED Provider Notes (Signed)
[facility] Provider Note   CSN: [id number] Arrival date & time: 01/09/22  1558     History  Chief Complaint  Patient presents with   Facial Droop    Daniel Osborne is a 76 y.o. male.  HPI  Patient with medical history including chronic leukemia hypertension presents to the emergency department with complaints of left-sided facial droop.  Patient states this started around 10 AM, states that he noticed that when he was placing things mouth they would just fall out, and he was having abnormal speech.  He denies any headaches, change in vision, paresthesias or weakness upper or lower extremities, he states that he woke up just fine then developed these symptoms, he has no history of CVAs, arrhythmias, or TIAs.  States he has history of chronic leukemia which she is not taking any medication for.  Denies any alleviating or aggravating factors, has no other complaints at this time.  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  Home Medications Prior to Admission medications   Medication Sig Start Date End Date Taking? Authorizing Provider  acetaminophen (TYLENOL) 500 MG tablet Take 500 mg by mouth every 6 (six) hours as needed.   Yes [provider]  lisinopril (ZESTRIL) 10 MG tablet Take 5 mg by mouth daily.   Yes [provider]  amoxicillin-clavulanate (AUGMENTIN) 875-125 MG tablet TAKE 1 TABLET BY MOUTH 2 (TWO) TIMES DAILY. Patient not taking: Reported on 01/09/2022 12/07/15   Claretta Fraise, MD  fluticasone [facility]) 50 MCG/ACT nasal spray Place 2 sprays into both nostrils daily. Patient not taking: Reported on 01/09/2022 11/19/15   Sharion Balloon, FNP      Allergies    Patient has no known  allergies.    Review of Systems   Review of Systems  Constitutional:  Negative for chills and fever.  Eyes:  Negative for visual disturbance.  Respiratory:  Negative for shortness of breath.   Cardiovascular:  Negative for chest pain.  Gastrointestinal:  Negative for abdominal pain.  Neurological:  Positive for facial asymmetry and speech difficulty. Negative for headaches.   Physical Exam Updated Vital Signs BP (!) 182/97    Pulse 96    Temp 98.8 F (37.1 C) (Oral)    Resp 16    Wt 77.2 kg    SpO2 94%    BMI 25.13 kg/m  Physical Exam Vitals and nursing note reviewed.  Constitutional:      General: He is not in acute distress.    Appearance: He is not ill-appearing.  HENT:     Head: Normocephalic and atraumatic.     Nose: No congestion.     Mouth/Throat:     Mouth: Mucous membranes are moist.     Pharynx: Oropharynx is clear. No oropharyngeal exudate or posterior oropharyngeal erythema.  Eyes:     General: No visual field deficit.    Extraocular Movements: Extraocular movements intact.     Conjunctiva/sclera: Conjunctivae normal.     Pupils: Pupils are equal, round, and reactive to light.  Cardiovascular:     Rate and Rhythm: Normal rate and regular rhythm.     Pulses: Normal pulses.     Heart sounds: No murmur heard.  No friction rub. No gallop.  Pulmonary:     Effort: No respiratory distress.     Breath sounds: No wheezing, rhonchi or rales.  Abdominal:     Palpations: Abdomen is soft.     Tenderness: There is no abdominal tenderness. There is no right CVA tenderness or left CVA tenderness.  Skin:    General: Skin is warm and dry.  Neurological:     Mental Status: He is alert.     Cranial Nerves: Cranial nerve deficit and facial asymmetry present.     Sensory: Sensation is intact.     Motor: Weakness present. No pronator drift.     Coordination: Romberg sign negative.     Comments: Patient is alert and oriented x4, has noted slurring speech, slight left-sided  facial droop, no difficulty with word finding, had slight decrease strength in his left shoulder, but no weakness present in his upper and or lower extremities.  Sensation fully intact.  Psychiatric:        Mood and Affect: Mood normal.    ED Results / Procedures / Treatments   Labs (all labs ordered are listed, but only abnormal results are displayed) Labs Reviewed  BASIC METABOLIC PANEL - Abnormal; Notable for the following components:      Result Value   Potassium 3.4 (*)    Creatinine, Ser 1.28 (*)    GFR, Estimated 59 (*)    All other components within normal limits  CBC WITH DIFFERENTIAL/PLATELET - Abnormal; Notable for the following components:   WBC 40.0 (*)    Lymphs Abs 35.0 (*)    All other components within normal limits  RESP PANEL BY RT-PCR (FLU A&B, COVID) [id number]  PROTIME-INR  PATHOLOGIST SMEAR REVIEW    EKG EKG Interpretation  Date/Time:  Monday January 09 2022 16:12:22 EST Ventricular Rate:  83 PR Interval:  174 QRS Duration: 134 QT Interval:  384 QTC Calculation: 451 R Axis:   -51 Text Interpretation: Normal sinus rhythm Right bundle branch block Left anterior fascicular block ** Bifascicular block ** Abnormal ECG No previous ECGs available Confirmed by Nanda Quinton [phone]) on 01/09/2022 4:23:41 PM  Radiology CT ANGIO HEAD NECK W WO CM  Result Date: 01/09/2022 CLINICAL DATA:  Neuro deficit, acute, stroke suspected EXAM: CT ANGIOGRAPHY HEAD AND NECK TECHNIQUE: Multidetector CT imaging of the head and neck was performed using the standard protocol during bolus administration of intravenous contrast. Multiplanar CT image reconstructions and MIPs were obtained to evaluate the vascular anatomy. Carotid stenosis measurements (when applicable) are obtained utilizing NASCET criteria, using the distal internal carotid diameter as the denominator. RADIATION DOSE REDUCTION: This exam was performed according to the departmental dose-optimization program which includes  automated exposure control, adjustment of the mA and/or kV according to patient size and/or use of iterative reconstruction technique. CONTRAST:  38mL OMNIPAQUE IOHEXOL 350 MG/ML SOLN COMPARISON:  None. FINDINGS: CT HEAD FINDINGS Brain: Patchy hypoattenuation in the right frontal white matter and bilateral basal ganglia. No acute hemorrhage, mass lesion, midline shift, hydrocephalus or visible extra-axial fluid collection. Mild atrophy. Vascular: See below. Skull: No acute fracture. Sinuses: Small right maxillary sinus with mild mucosal thickening. Otherwise, clear sinuses. Orbits: No acute finding. Review of the MIP images confirms the above findings CTA NECK FINDINGS Aortic arch: Great vessel origins are patent. Right carotid system: Mixed calcific and noncalcific atherosclerosis at the carotid bifurcation. 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 The ICA remains non-opacified in the neck. Left carotid system: Mixed calcific and noncalcific  atherosclerosis at the carotid bifurcation and involving the proximal ICA. Approximately 70-80% stenosis of the proximal ICA. Vertebral arteries: Co dominant. Severe right vertebral artery origin stenosis. Otherwise, vertebral arteries are patent without significant stenosis. Skeleton: No evidence of acute abnormality on limited assessment. Other neck: Increased number of lymph nodes in the neck bilaterally and in the visualized upper mediastinum with enlarged bilateral submandibular and upper cervical chain nodes. Left level 2 node measures up to 1.5 cm short axis and is rounded. Submandibular nodes measure up to approximately 1.3 cm short axis on the right. Upper chest: Visualized lung apices are clear. Review of the MIP images confirms the above findings CTA HEAD FINDINGS Anterior circulation: Reconstitution of the right ICA at the proximal petrous segment. Bilateral intracranial ICA atherosclerosis with moderate to severe bilateral paraclinoid ICA stenosis. Bilateral MCAs are  patent with multifocal mild-to-moderate M1 and M2 MCA stenosis. Small right A1 ACA, probably congenital given prominent left A1 ACA. Otherwise, ACAs are patent without proximal high-grade stenosis Posterior circulation: Bilateral intradural vertebral arteries are patent. Severe stenosis of the distal right intradural vertebral artery. Moderate stenosis of the distal left intradural vertebral artery. Basilar artery is patent with multifocal mild stenosis. Multifocal severe left P1 and P2 PCA stenosis. Moderate right P1 PCA stenosis. Venous sinuses: As permitted by contrast timing, patent. Review of the MIP images confirms the above findings IMPRESSION: CT head: 1. Patchy hypoattenuation in the right frontal white matter and bilateral basal ganglia, which could represent chronic microvascular ischemic disease versus age indeterminate infarcts in the absence of priors. MRI could provide more sensitive evaluation for acute infarct. 2. No evidence of acute hemorrhage. CTA: 1. 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 2. Moderate to severe bilateral paraclinoid ICA stenosis. 3. Multifocal severe left P1 and P2 PCA stenosis. Moderate right P1 PCA stenosis. 4. Severe right vertebral artery origin stenosis. Also, severe right and moderate left distal intradural vertebral artery stenosis. 5. Approximately 70-80% stenosis of the proximal left ICA in the neck. 6. Multifocal mild-to-moderate bilateral M1 and M2 MCA stenosis 7. Increased number of lymph nodes in the neck bilaterally and in the visualized upper mediastinum with enlarged and somewhat rounded submandibular and upper cervical chain nodes. While nonspecific, findings raise concern for underlying lymphoproliferative disorder. Consider follow-up CT neck and possibly CT chest/abdomen/pelvis for further evaluation. Findings discussed with Ileene Patrick PA via telephone  at 7:16 PM Electronically Signed   By: Margaretha Sheffield M.D.   On: 01/09/2022 19:19    Procedures Procedures    Medications Ordered in ED Medications  iohexol (OMNIPAQUE) 350 MG/ML injection 100 mL (75 mLs Intravenous Contrast Given 01/09/22 1828)  aspirin tablet 650 mg (650 mg Oral Given 01/09/22 2103)  potassium chloride (KLOR-CON) packet 20 mEq (20 mEq Oral Given 01/09/22 2236)    ED Course/ Medical Decision Making/ A&P                           Medical Decision Making Amount and/or Complexity of Data Reviewed Labs: ordered. Radiology: ordered. ECG/medicine tests: ordered.  Risk OTC drugs. Prescription drug management. Decision regarding hospitalization.   This patient presents to the ED for concern of facial droop, this involves an extensive number of treatment options, and is a complaint that carries with it a high risk of complications and morbidity.  The differential diagnosis includes CVA, intracranial  head bleed, dissection metabolic abnormality    Additional history obtained:  Additional history obtained from son who was at bedside    Co morbidities that complicate the patient evaluation  N/A  Social Determinants of Health:  N/A    Lab Tests:  I Ordered, and personally interpreted labs.  The pertinent results include: CBC shows leukocytosis of 40, BMP shows potassium 3.4 creatinine 1.28 with a time INR unremarkable respiratory panel unremarkable NIH scale 2    Imaging Studies ordered:  I ordered imaging studies including CTA head neck I independently visualized and interpreted imaging which showed shows chronic microvascular ischemia versus age-indeterminate infarction right frontal white matter.  Also notes right RCA occlusion, with significant  stenosis. I agree with the radiologist interpretation   Cardiac Monitoring:  The patient was maintained on a cardiac monitor.  I personally viewed and interpreted the cardiac monitored which showed an  underlying rhythm of: Sinus without signs of ischemia   Medicines ordered and prescription drug management:  I ordered medication including aspirin for CVA I have reviewed the patients home medicines and have made adjustments as needed   Reevaluation:  Patient was reassessed found resting comfortably vital signs remained stable, patient continues to have slurring of his words but no worsening symptoms at this time.  Updated on imaging we will consult with neurology for further recommendations  Updated patient on recommendation from neurology agreement this plan will consult hospitalist for admission Consultations Obtained:  I requested consultation with Dr. Cheral Marker of neurology,  and discussed lab and imaging findings as well as pertinent plan - they recommend: Recommend hospital admission transfer to [facility] for stroke work-up, frequent neurochecks, provide him with 650 aspirin, swallow study, treat hypertension if systolic greater than 885 or diastolic greater than 027, recommends against thrombectomy at this time due to his low NIH score Spoke with Dr. Nori Riis of the admitting team will admit  Rule out   Low suspicion for dissection of the vertebral or carotid artery as presentation atypical of etiology.  Low suspicion for meningitis as she has no meningeal sign present.  Laceration for metabolic abnormality as he is afebrile, nontachycardic, BMP is unremarkable.  It is noted that patient has an elevated white count of 40,000 patient has a history of chronic leukemia and is likely secondary to this, I doubt infection at this time as he is afebrile nontachycardic no obvious source of infection on my exam.  I have low suspicion patient will need thrombectomy  or IR intervention as he has not NIH score of 2 risks outweigh benefits as there would be minimal improvement of his symptoms and treatment can cause intracranial bleed and/or death.  Patient and son were both made aware of this and they  are agreement with this.     Dispostion and problem list  After consideration of the diagnostic results and the patients response to treatment, I feel that the patent would benefit from admission and transfer to [facility].  CVA-patient will need stroke work-up and transferred to [facility] for further evaluation.            Final Clinical Impression(s) / ED Diagnoses Final diagnoses:  Cerebrovascular accident (CVA), unspecified mechanism [employer])    Rx / [facility] Orders ED Discharge Orders     None         Marcello Fennel, PA-C 01/09/22 2241    Margette Fast, MD 01/17/22 [phone]

## 2022-01-09 NOTE — ED Triage Notes (Signed)
Left side facial droop onset 10am today, denies numbness, unable to eat food

## 2022-01-10 ENCOUNTER — Other Ambulatory Visit: Payer: Self-pay

## 2022-01-10 ENCOUNTER — Other Ambulatory Visit (HOSPITAL_COMMUNITY): Payer: Self-pay | Admitting: *Deleted

## 2022-01-10 ENCOUNTER — Encounter (HOSPITAL_COMMUNITY): Payer: Self-pay | Admitting: Family Medicine

## 2022-01-10 ENCOUNTER — Observation Stay (HOSPITAL_COMMUNITY): Payer: Medicare Other

## 2022-01-10 ENCOUNTER — Observation Stay (HOSPITAL_BASED_OUTPATIENT_CLINIC_OR_DEPARTMENT_OTHER): Payer: Medicare Other

## 2022-01-10 DIAGNOSIS — I10 Essential (primary) hypertension: Secondary | ICD-10-CM | POA: Diagnosis not present

## 2022-01-10 DIAGNOSIS — E876 Hypokalemia: Secondary | ICD-10-CM | POA: Diagnosis present

## 2022-01-10 DIAGNOSIS — D649 Anemia, unspecified: Secondary | ICD-10-CM | POA: Diagnosis not present

## 2022-01-10 DIAGNOSIS — D72829 Elevated white blood cell count, unspecified: Secondary | ICD-10-CM | POA: Diagnosis not present

## 2022-01-10 DIAGNOSIS — I6389 Other cerebral infarction: Secondary | ICD-10-CM | POA: Diagnosis not present

## 2022-01-10 DIAGNOSIS — G8194 Hemiplegia, unspecified affecting left nondominant side: Secondary | ICD-10-CM | POA: Diagnosis present

## 2022-01-10 DIAGNOSIS — I454 Nonspecific intraventricular block: Secondary | ICD-10-CM | POA: Diagnosis present

## 2022-01-10 DIAGNOSIS — R29810 Facial weakness: Secondary | ICD-10-CM | POA: Diagnosis present

## 2022-01-10 DIAGNOSIS — C9110 Chronic lymphocytic leukemia of B-cell type not having achieved remission: Secondary | ICD-10-CM | POA: Diagnosis not present

## 2022-01-10 DIAGNOSIS — C9510 Chronic leukemia of unspecified cell type not having achieved remission: Secondary | ICD-10-CM | POA: Diagnosis present

## 2022-01-10 DIAGNOSIS — Z806 Family history of leukemia: Secondary | ICD-10-CM | POA: Diagnosis not present

## 2022-01-10 DIAGNOSIS — I6320 Cerebral infarction due to unspecified occlusion or stenosis of unspecified precerebral arteries: Secondary | ICD-10-CM | POA: Diagnosis not present

## 2022-01-10 DIAGNOSIS — R531 Weakness: Secondary | ICD-10-CM | POA: Diagnosis present

## 2022-01-10 DIAGNOSIS — E785 Hyperlipidemia, unspecified: Secondary | ICD-10-CM | POA: Diagnosis present

## 2022-01-10 DIAGNOSIS — Z20822 Contact with and (suspected) exposure to covid-19: Secondary | ICD-10-CM | POA: Diagnosis present

## 2022-01-10 DIAGNOSIS — Z823 Family history of stroke: Secondary | ICD-10-CM | POA: Diagnosis not present

## 2022-01-10 DIAGNOSIS — I639 Cerebral infarction, unspecified: Secondary | ICD-10-CM | POA: Diagnosis not present

## 2022-01-10 DIAGNOSIS — I6522 Occlusion and stenosis of left carotid artery: Secondary | ICD-10-CM | POA: Diagnosis not present

## 2022-01-10 DIAGNOSIS — Z66 Do not resuscitate: Secondary | ICD-10-CM | POA: Diagnosis present

## 2022-01-10 DIAGNOSIS — R471 Dysarthria and anarthria: Secondary | ICD-10-CM | POA: Diagnosis present

## 2022-01-10 DIAGNOSIS — I63233 Cerebral infarction due to unspecified occlusion or stenosis of bilateral carotid arteries: Secondary | ICD-10-CM | POA: Diagnosis not present

## 2022-01-10 DIAGNOSIS — I6782 Cerebral ischemia: Secondary | ICD-10-CM | POA: Diagnosis not present

## 2022-01-10 LAB — COMPREHENSIVE METABOLIC PANEL
ALT: 13 U/L (ref 0–44)
AST: 17 U/L (ref 15–41)
Albumin: 3.5 g/dL (ref 3.5–5.0)
Alkaline Phosphatase: 60 U/L (ref 38–126)
Anion gap: 7 (ref 5–15)
BUN: 20 mg/dL (ref 8–23)
CO2: 26 mmol/L (ref 22–32)
Calcium: 9 mg/dL (ref 8.9–10.3)
Chloride: 108 mmol/L (ref 98–111)
Creatinine, Ser: 1.17 mg/dL (ref 0.61–1.24)
GFR, Estimated: >60 mL/min (ref >60)
Glucose, Bld: 90 mg/dL (ref 70–99)
Potassium: 4 mmol/L (ref 3.5–5.1)
Sodium: 141 mmol/L (ref 135–145)
Total Bilirubin: 0.4 mg/dL (ref 0.3–1.2)
Total Protein: 6.5 g/dL (ref 6.5–8.1)

## 2022-01-10 LAB — ECHOCARDIOGRAM COMPLETE
AR max vel: 1.44 cm2
AV Area VTI: 1.42 cm2
AV Area mean vel: 1.28 cm2
AV Mean grad: 12 mmHg
AV Peak grad: 20.6 mmHg
Ao pk vel: 2.27 m/s
Area-P 1/2: 3.37 cm2
S' Lateral: 2.8 cm
Weight: 2723.12 oz

## 2022-01-10 LAB — CBC
HCT: 41.7 % (ref 39.0–52.0)
Hemoglobin: 12.7 g/dL — ABNORMAL LOW (ref 13.0–17.0)
MCH: 29.8 pg (ref 26.0–34.0)
MCHC: 30.5 g/dL (ref 30.0–36.0)
MCV: 97.9 fL (ref 80.0–100.0)
Platelets: 150 10*3/uL (ref 150–400)
RBC: 4.26 MIL/uL (ref 4.22–5.81)
RDW: 14.9 % (ref 11.5–15.5)
WBC: 40.4 10*3/uL — ABNORMAL HIGH (ref 4.0–10.5)
nRBC: 0 % (ref 0.0–0.2)

## 2022-01-10 LAB — LIPID PANEL
Cholesterol: 218 mg/dL — ABNORMAL HIGH (ref 0–200)
HDL: 36 mg/dL — ABNORMAL LOW (ref >40)
LDL Cholesterol: 158 mg/dL — ABNORMAL HIGH (ref 0–99)
Total CHOL/HDL Ratio: 6.1 RATIO
Triglycerides: 119 mg/dL (ref <150)
VLDL: 24 mg/dL (ref 0–40)

## 2022-01-10 LAB — PATHOLOGIST SMEAR REVIEW

## 2022-01-10 LAB — MAGNESIUM: Magnesium: 1.9 mg/dL (ref 1.7–2.4)

## 2022-01-10 MED ORDER — OXYCODONE HCL 5 MG PO TABS
5.0000 mg | ORAL_TABLET | Freq: Once | ORAL | Status: AC
  Administered 2022-01-10: 5 mg via ORAL
  Filled 2022-01-10: qty 1

## 2022-01-10 MED ORDER — LABETALOL HCL 5 MG/ML IV SOLN: 10.0000 mg | INTRAVENOUS | Status: DC | PRN

## 2022-01-10 MED ORDER — ATORVASTATIN CALCIUM 80 MG PO TABS
80.0000 mg | ORAL_TABLET | Freq: Every day | ORAL | Status: DC
  Administered 2022-01-10 – 2022-01-13 (×4): 80 mg via ORAL
  Filled 2022-01-10 (×2): qty 1
  Filled 2022-01-10: qty 2
  Filled 2022-01-10: qty 1

## 2022-01-10 NOTE — Progress Notes (Signed)
PROGRESS NOTE     Daniel Osborne, is a 76 y.o. male, DOB - [birth year], ZOX:[id number]  Admit date - 01/09/2022   Admitting Physician Jash Wahlen Denton Brick, MD  Outpatient Primary MD for the patient is [facility], [facility]  LOS - 0  Chief Complaint  Patient presents with   Facial Droop        Brief Narrative:  -  76 y.o. male with medical history significant of with history of chronic leukemia and hypertension, and dyslipidemia admitted on 01/09/2022 with acute and subacute strokes with imaging studies suggesting ICA, right vertebral artery and left distal intradural vertebral artery stenosis, approximately 70 to 80% stenosis of the proximal left ICA in the neck -Neurologists Dr. Mendel Ryder and Dr. Rory Percy recommends transfer to [facility] for further neurology evaluation   Assessment & Plan:   -Assessment and Plan: * CVA (cerebral vascular accident) [facility])- (present on admission) - Acute CVA--POA -MRI Brain shows Small acute cortical/subcortical right frontal infarcts with involvement of lateral precentral gyrus.  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  CTA shows multiple intracranial vascular pathologies and carotid pathology as well As per Dr. Rory Percy and Dr Kipp Brood [facility]  Speech disturbance mostly resolved, patient has residual mild  left facial droop -Echo with EF of 60 to 81%, grade 1 diastolic dysfunction Awaiting speech, physical therapy, Occupational Therapy consult -Patient will need ongoing monitoring on telemetry -Allow permissive hypertension -Hold lisinopril -IV labetalol as needed per parameters -A1c and TSH as ordered -LDL 158, HDL 36 -Aspirin and Lipitor as ordered -Neurologist advised to hold off Plavix at this time pending further evaluation   Essential hypertension- (present on admission) Holding lisinopril for permissive hypertension -IV labetalol as above in  #1  Leukocytosis- (present on admission) Consistent with chronic leukemia -  Hypokalemia- (present on admission) - Repletion recheck, magnesium WNL   Disposition/Need for in-Hospital Stay- patient unable to be discharged at this time due to awaiting transfer to [facility] for further neurology evaluation  Status is: Inpatient  Remains inpatient appropriate because: Acute strokes requiring further evaluation  Disposition: The patient is from: Home              Anticipated d/c is to: Home              Anticipated d/c date is: 1 day              Patient currently is not medically stable to d/c. Barriers: Not Clinically Stable-   Code Status :  -  Code Status: DNR   Family Communication:    NA (patient is alert, awake and coherent)   Consults  :  Neurology  DVT Prophylaxis  :   - SCDs  heparin injection 5,000 Units Start: 01/09/22 2315 SCD's Start: 01/09/22 2307    Lab Results  Component Value Date   PLT 150 01/10/2022    Inpatient Medications  Scheduled Meds:   stroke: mapping our early stages of recovery book   Does not apply Once   aspirin EC  81 mg Oral Daily   atorvastatin  80 mg Oral Daily   heparin  5,000 Units Subcutaneous Q8H   Continuous Infusions: PRN Meds:.acetaminophen **OR** acetaminophen (TYLENOL) oral liquid 160 mg/5 mL **OR** acetaminophen, labetalol, ondansetron (ZOFRAN) IV, senna-docusate   Anti-infectives (From admission, onward)    None         Subjective: Daniel Osborne today has no fevers, no emesis,  No chest pain,  -Speech  improved -No significant swallowing concerns at this time   Objective: Vitals:   01/10/22 1308 01/10/22 1400 01/10/22 1500 01/10/22 1530  BP: (!) 161/98 (!) 165/84 (!) 166/89 (!) 165/84  Pulse: 86 81 85 83  Resp: 14 18 18 16   Temp:      TempSrc:      SpO2: 98% 96% 99% 98%  Weight:       No intake or output data in the 24 hours ending 01/10/22 1538 Filed Weights   01/09/22 1609  Weight: 77.2  kg    Physical Exam  Gen:- Awake Alert,  in no apparent distress  HEENT:- [place].AT, No sclera icterus Neck-Supple Neck,No JVD,.  Lungs-  CTAB , fair symmetrical air movement CV- S1, S2 normal, regular  Abd-  +ve B.Sounds, Abd Soft, No tenderness,    Extremity/Skin:- No  edema, pedal pulses present  Psych-affect is appropriate, oriented x3 Neuro-mild left facial droop,  no tremors  Data Reviewed: I have personally reviewed following labs and imaging studies  CBC: Recent Labs  Lab 01/09/22 1620 01/10/22 0410  WBC 40.0* 40.4*  NEUTROABS 3.9  --   HGB 13.0 12.7*  HCT 41.5 41.7  MCV 97.2 97.9  PLT 182 193   Basic Metabolic Panel: Recent Labs  Lab 01/09/22 1620 01/10/22 0410  NA 140 141  K 3.4* 4.0  CL 104 108  CO2 29 26  GLUCOSE 88 90  BUN 22 20  CREATININE 1.28* 1.17  CALCIUM 9.4 9.0  MG  --  1.9   GFR: CrCl cannot be calculated (Unknown ideal weight.). Liver Function Tests: Recent Labs  Lab 01/10/22 0410  AST 17  ALT 13  ALKPHOS 60  BILITOT 0.4  PROT 6.5  ALBUMIN 3.5   No results for input(s): LIPASE, AMYLASE in the last 168 hours. No results for input(s): AMMONIA in the last 168 hours. Coagulation Profile: Recent Labs  Lab 01/09/22 1620  INR 1.0   Cardiac Enzymes: No results for input(s): CKTOTAL, CKMB, CKMBINDEX, TROPONINI in the last 168 hours. BNP (last 3 results) No results for input(s): PROBNP in the last 8760 hours. HbA1C: No results for input(s): HGBA1C in the last 72 hours. CBG: No results for input(s): GLUCAP in the last 168 hours. Lipid Profile: Recent Labs    01/10/22 0410  CHOL 218*  HDL 36*  LDLCALC 158*  TRIG 119  CHOLHDL 6.1   Thyroid Function Tests: No results for input(s): TSH, T4TOTAL, FREET4, T3FREE, THYROIDAB in the last 72 hours. Anemia Panel: No results for input(s): VITAMINB12, FOLATE, FERRITIN, TIBC, IRON, RETICCTPCT in the last 72 hours. Urine analysis: No results found for: COLORURINE, APPEARANCEUR, LABSPEC,  PHURINE, GLUCOSEU, HGBUR, BILIRUBINUR, KETONESUR, PROTEINUR, UROBILINOGEN, NITRITE, LEUKOCYTESUR Sepsis Labs: @LABRCNTIP (procalcitonin:4,lacticidven:4)  ) Recent Results (from the past 240 hour(s))  Resp Panel by RT-PCR (Flu A&B, Covid) Nasopharyngeal Swab     Status: None   Collection Time: 01/09/22  4:40 PM   Specimen: Nasopharyngeal Swab; Nasopharyngeal(NP) swabs in vial transport medium  Result Value Ref Range Status   SARS Coronavirus 2 by RT PCR NEGATIVE NEGATIVE Final    Comment: (NOTE) SARS-CoV-2 target nucleic acids are NOT DETECTED.  The SARS-CoV-2 RNA is generally detectable in upper respiratory specimens during the acute phase of infection. The lowest concentration of SARS-CoV-2 viral copies this assay can detect is 138 copies/mL. A negative result does not preclude SARS-Cov-2 infection and should not be used as the sole basis for treatment or other patient management decisions. A negative result may occur  with  improper specimen collection/handling, submission of specimen other than nasopharyngeal swab, presence of viral mutation(s) within the areas targeted by this assay, and inadequate number of viral copies(<138 copies/mL). A negative result must be combined with clinical observations, patient history, and epidemiological information. The expected result is Negative.  Fact Sheet for Patients:  EntrepreneurPulse.com.au  Fact Sheet for Healthcare Providers:  IncredibleEmployment.be  This test is no t yet approved or cleared by the [place] FDA and  has been authorized for detection and/or diagnosis of SARS-CoV-2 by FDA under an Emergency Use Authorization (EUA). This EUA will remain  in effect (meaning this test can be used) for the duration of the COVID-19 declaration under Section 564(b)(1) of the Act, 21 U.S.C.section 360bbb-3(b)(1), unless the authorization is terminated  or revoked sooner.       Influenza A by PCR  NEGATIVE NEGATIVE Final   Influenza B by PCR NEGATIVE NEGATIVE Final    Comment: (NOTE) The Xpert Xpress SARS-CoV-2/FLU/RSV plus assay is intended as an aid in the diagnosis of influenza from Nasopharyngeal swab specimens and should not be used as a sole basis for treatment. Nasal washings and aspirates are unacceptable for Xpert Xpress SARS-CoV-2/FLU/RSV testing.  Fact Sheet for Patients: EntrepreneurPulse.com.au  Fact Sheet for Healthcare Providers: IncredibleEmployment.be  This test is not yet approved or cleared by the [place] FDA and has been authorized for detection and/or diagnosis of SARS-CoV-2 by FDA under an Emergency Use Authorization (EUA). This EUA will remain in effect (meaning this test can be used) for the duration of the COVID-19 declaration under Section 564(b)(1) of the Act, 21 U.S.C. section 360bbb-3(b)(1), unless the authorization is terminated or revoked.  Performed at [facility], [address], [place], [place] [address]     Radiology Studies: CT ANGIO HEAD NECK W WO CM  Result Date: 01/09/2022 CLINICAL DATA:  Neuro deficit, acute, stroke suspected EXAM: CT ANGIOGRAPHY HEAD AND NECK TECHNIQUE: Multidetector CT imaging of the head and neck was performed using the standard protocol during bolus administration of intravenous contrast. Multiplanar CT image reconstructions and MIPs were obtained to evaluate the vascular anatomy. Carotid stenosis measurements (when applicable) are obtained utilizing NASCET criteria, using the distal internal carotid diameter as the denominator. RADIATION DOSE REDUCTION: This exam was performed according to the departmental dose-optimization program which includes automated exposure control, adjustment of the mA and/or kV according to patient size and/or use of iterative reconstruction technique. CONTRAST:  55mL OMNIPAQUE IOHEXOL 350 MG/ML SOLN COMPARISON:  None. FINDINGS: CT HEAD FINDINGS Brain:  Patchy hypoattenuation in the right frontal white matter and bilateral basal ganglia. No acute hemorrhage, mass lesion, midline shift, hydrocephalus or visible extra-axial fluid collection. Mild atrophy. Vascular: See below. Skull: No acute fracture. Sinuses: Small right maxillary sinus with mild mucosal thickening. Otherwise, clear sinuses. Orbits: No acute finding. Review of the MIP images confirms the above findings CTA NECK FINDINGS Aortic arch: Great vessel origins are patent. Right carotid system: Mixed calcific and noncalcific atherosclerosis at the carotid bifurcation. 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 The ICA remains non-opacified in the neck. Left carotid system: Mixed calcific and noncalcific atherosclerosis at the carotid bifurcation and involving the proximal ICA. Approximately 70-80% stenosis of the proximal ICA. Vertebral arteries: Co dominant. Severe right vertebral artery origin stenosis. Otherwise, vertebral arteries are patent without significant stenosis. Skeleton: No evidence of acute abnormality on limited assessment. Other neck: Increased number of lymph nodes in the neck bilaterally and in the visualized upper mediastinum with enlarged bilateral  submandibular and upper cervical chain nodes. Left level 2 node measures up to 1.5 cm short axis and is rounded. Submandibular nodes measure up to approximately 1.3 cm short axis on the right. Upper chest: Visualized lung apices are clear. Review of the MIP images confirms the above findings CTA HEAD FINDINGS Anterior circulation: Reconstitution of the right ICA at the proximal petrous segment. Bilateral intracranial ICA atherosclerosis with moderate to severe bilateral paraclinoid ICA stenosis. Bilateral MCAs are patent with multifocal mild-to-moderate M1 and M2 MCA stenosis. Small right A1 ACA, probably congenital given prominent left A1 ACA. Otherwise, ACAs are patent without proximal high-grade stenosis Posterior circulation: Bilateral  intradural vertebral arteries are patent. Severe stenosis of the distal right intradural vertebral artery. Moderate stenosis of the distal left intradural vertebral artery. Basilar artery is patent with multifocal mild stenosis. Multifocal severe left P1 and P2 PCA stenosis. Moderate right P1 PCA stenosis. Venous sinuses: As permitted by contrast timing, patent. Review of the MIP images confirms the above findings IMPRESSION: CT head: 1. Patchy hypoattenuation in the right frontal white matter and bilateral basal ganglia, which could represent chronic microvascular ischemic disease versus age indeterminate infarcts in the absence of priors. MRI could provide more sensitive evaluation for acute infarct. 2. No evidence of acute hemorrhage. CTA: 1. 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 2. Moderate to severe bilateral paraclinoid ICA stenosis. 3. Multifocal severe left P1 and P2 PCA stenosis. Moderate right P1 PCA stenosis. 4. Severe right vertebral artery origin stenosis. Also, severe right and moderate left distal intradural vertebral artery stenosis. 5. Approximately 70-80% stenosis of the proximal left ICA in the neck. 6. Multifocal mild-to-moderate bilateral M1 and M2 MCA stenosis 7. Increased number of lymph nodes in the neck bilaterally and in the visualized upper mediastinum with enlarged and somewhat rounded submandibular and upper cervical chain nodes. While nonspecific, findings raise concern for underlying lymphoproliferative disorder. Consider follow-up CT neck and possibly CT chest/abdomen/pelvis for further evaluation. Findings discussed with Ileene Patrick PA via telephone at 7:16 PM Electronically Signed   By: Margaretha Sheffield M.D.   On: 01/09/2022 19:19   MR BRAIN WO CONTRAST  Result Date: 01/10/2022 CLINICAL DATA:  Neuro deficit, acute, stroke suspected EXAM: MRI HEAD WITHOUT CONTRAST  TECHNIQUE: Multiplanar, multiecho pulse sequences of the brain and surrounding structures were obtained without intravenous contrast. COMPARISON:  None. FINDINGS: Brain: There is cortical/subcortical reduced diffusion in the right frontal lobe including involvement of lateral precentral gyrus. Patchy and confluent areas of T2 hyperintensity in the supratentorial white matter nonspecific but probably reflect mild to moderate chronic microvascular ischemic changes. There are chronic right frontoparietal cortical infarcts. Small chronic infarct of the right frontal subcortical white matter. Prominent perivascular spaces and probable superimposed chronic small vessel infarcts of the central gray nuclei and white matter. Chronic blood products along the right basal ganglia and adjacent white matter. No intracranial mass or mass effect. There is no hydrocephalus or extra-axial fluid collection. Prominence of the ventricles and sulci reflects mild parenchymal volume loss. Vascular: Diminished right ICA flow void. Skull and upper cervical spine: Marrow signal is mildly heterogeneous but otherwise unremarkable. Sinuses/Orbits: Mild mucosal thickening.  Orbits are unremarkable. Other: Sella is unremarkable.  Mastoid air cells are clear. IMPRESSION: Small acute cortical/subcortical right frontal infarcts with involvement of lateral precentral gyrus. 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 Electronically Signed   By: Macy Mis  M.D.   On: 01/10/2022 09:41   ECHOCARDIOGRAM COMPLETE  Result Date: 01/10/2022    ECHOCARDIOGRAM REPORT   Patient Name:   Daniel Osborne Date of Exam: 01/10/2022 Medical Rec #:  [id number]       Height:       69.0 in Accession #:    [id number]      Weight:       170.2 lb Date of Birth:  Jan 19, 1946       BSA:          1.929 m Patient Age:    36 years        BP:           141/84 mmHg Patient Gender: M               HR:           81 bpm. Exam Location:  [facility] Procedure: 2D  Echo, Cardiac Doppler and Color Doppler Indications:    Stroke I63.9  History:        Patient has no prior history of Echocardiogram examinations.                 Risk Factors:Hypertension. CVA (cerebral vascular accident)                 (Ali Chuk).  Sonographer:    Alvino Chapel RCS Referring Phys: [id number] ASIA B [place]  1. Left ventricular ejection fraction, by estimation, is 60 to 65%. The left ventricle has normal function. The left ventricle has no regional wall motion abnormalities. Left ventricular diastolic parameters are consistent with Grade I diastolic dysfunction (impaired relaxation).  2. Right ventricular systolic function is normal. The right ventricular size is normal. Tricuspid regurgitation signal is inadequate for assessing PA pressure.  3. The mitral valve is degenerative. Trivial mitral valve regurgitation. No evidence of mitral stenosis.  4. The aortic valve is tricuspid. Aortic valve regurgitation is trivial. Aortic valve sclerosis is present, with no evidence of aortic valve stenosis.  5. The inferior vena cava is normal in size with greater than 50% respiratory variability, suggesting right atrial pressure of 3 mmHg. Comparison(s): No prior Echocardiogram. FINDINGS  Left Ventricle: Left ventricular ejection fraction, by estimation, is 60 to 65%. The left ventricle has normal function. The left ventricle has no regional wall motion abnormalities. The left ventricular internal cavity size was normal in size. There is  no left ventricular hypertrophy. Left ventricular diastolic parameters are consistent with Grade I diastolic dysfunction (impaired relaxation). Right Ventricle: The right ventricular size is normal. No increase in right ventricular wall thickness. Right ventricular systolic function is normal. Tricuspid regurgitation signal is inadequate for assessing PA pressure. Left Atrium: Left atrial size was normal in size. Right Atrium: Right atrial size was normal in size.  Pericardium: There is no evidence of pericardial effusion. Mitral Valve: The mitral valve is degenerative in appearance. Trivial mitral valve regurgitation. No evidence of mitral valve stenosis. Tricuspid Valve: The tricuspid valve is normal in structure. Tricuspid valve regurgitation is trivial. No evidence of tricuspid stenosis. Aortic Valve: The aortic valve is tricuspid. Aortic valve regurgitation is trivial. Aortic valve sclerosis is present, with no evidence of aortic valve stenosis. Aortic valve mean gradient measures 12.0 mmHg. Aortic valve peak gradient measures 20.6 mmHg. Aortic valve area, by VTI measures 1.42 cm. Pulmonic Valve: The pulmonic valve was not well visualized. Pulmonic valve regurgitation is not visualized. No evidence of pulmonic stenosis. Aorta: The aortic root is normal in  size and structure. Venous: The inferior vena cava is normal in size with greater than 50% respiratory variability, suggesting right atrial pressure of 3 mmHg. IAS/Shunts: No atrial level shunt detected by color flow Doppler.  LEFT VENTRICLE PLAX 2D LVIDd:         4.20 cm   Diastology LVIDs:         2.80 cm   LV e' medial:    7.94 cm/s LV PW:         1.00 cm   LV E/e' medial:  11.6 LV IVS:        0.90 cm   LV e' lateral:   5.98 cm/s LVOT diam:     1.90 cm   LV E/e' lateral: 15.5 LV SV:         67 LV SV Index:   35 LVOT Area:     2.84 cm  RIGHT VENTRICLE RV S prime:     12.10 cm/s TAPSE (M-mode): 2.1 cm LEFT ATRIUM             Index        RIGHT ATRIUM           Index LA diam:        3.60 cm 1.87 cm/m   RA Area:     15.70 cm LA Vol (A2C):   62.3 ml 32.30 ml/m  RA Volume:   40.20 ml  20.84 ml/m LA Vol (A4C):   45.2 ml 23.43 ml/m LA Biplane Vol: 55.0 ml 28.51 ml/m  AORTIC VALVE AV Area (Vmax):    1.44 cm AV Area (Vmean):   1.28 cm AV Area (VTI):     1.42 cm AV Vmax:           227.00 cm/s AV Vmean:          160.000 cm/s AV VTI:            0.470 m AV Peak Grad:      20.6 mmHg AV Mean Grad:      12.0 mmHg LVOT Vmax:          115.00 cm/s LVOT Vmean:        72.000 cm/s LVOT VTI:          0.235 m LVOT/AV VTI ratio: 0.50  AORTA Ao Root diam: 3.10 cm MITRAL VALVE MV Area (PHT): 3.37 cm     SHUNTS MV Decel Time: 225 msec     Systemic VTI:  0.24 m MV E velocity: 92.50 cm/s   Systemic Diam: 1.90 cm MV A velocity: 111.00 cm/s MV E/A ratio:  0.83 Rudean Haskell MD Electronically signed by Rudean Haskell MD Signature Date/Time: 01/10/2022/12:08:44 PM    Final      Scheduled Meds:   stroke: mapping our early stages of recovery book   Does not apply Once   aspirin EC  81 mg Oral Daily   atorvastatin  80 mg Oral Daily   heparin  5,000 Units Subcutaneous Q8H   Continuous Infusions:   LOS: 0 days    Roxan Hockey M.D on 01/10/2022 at 3:38 PM  Go to [email or web address] - for contact info  [facility] - Office  [phone]  If 7PM-7AM, please contact night-coverage [email or web address] Password [facility] 01/10/2022, 3:38 PM

## 2022-01-10 NOTE — ED Notes (Signed)
Pt ambulated to bathroom and back handled well

## 2022-01-10 NOTE — ED Notes (Signed)
Patient in MRI 

## 2022-01-10 NOTE — Plan of Care (Signed)
°  Problem: Education: Goal: Knowledge of General Education information will improve Description: Including pain rating scale, medication(s)/side effects and non-pharmacologic comfort measures Outcome: Progressing   Problem: Education: Goal: Knowledge of disease or condition will improve Outcome: Progressing   Problem: Coping: Goal: Will verbalize positive feelings about self Outcome: Progressing Goal: Will identify appropriate support needs Outcome: Progressing

## 2022-01-10 NOTE — Progress Notes (Signed)
°  Transition of Care [facility]) Screening Note   Patient Details  Name: Daniel Osborne Date of Birth: 03-21-1946   Transition of Care [facility]) CM/SW Contact:    Boneta Lucks, RN Phone Number: 01/10/2022, 2:13 PM  Transfer to [facility].  Transition of Care Department [facility]) has reviewed patient and no TOC needs have been identified at this time. We will continue to monitor patient advancement through interdisciplinary progression rounds. If new patient transition needs arise, please place a TOC consult.

## 2022-01-10 NOTE — ED Notes (Signed)
Ambulatory to bathroom without difficulty.   

## 2022-01-10 NOTE — ED Notes (Signed)
Patient ambulatory to bathroom with steady gait.

## 2022-01-10 NOTE — Progress Notes (Signed)
@  approx. 1915 pt arrived from [place] ED via [place]. VSS and Neuro exam unchanged from what was conveyed in report. Skin assessment completed with Day RN.   Dr. Cheral Marker, neurologist on-call, notified via secure chat of pt's arrival at Dr. Talmadge Coventry request.

## 2022-01-10 NOTE — ED Notes (Signed)
Up to bathroom, ambulatory without difficulty

## 2022-01-10 NOTE — Progress Notes (Signed)
*  PRELIMINARY RESULTS* Echocardiogram 2D Echocardiogram has been performed.  Daniel Osborne 01/10/2022, 11:32 AM

## 2022-01-10 NOTE — Hospital Course (Addendum)
76 y.o. male with medical history significant of with history of chronic leukemia and hypertension, and dyslipidemia admitted on 01/09/2022 with at [facility] hospital with complaint of left facial droop, left-sided weakness, slurred speech.  MRI of the brain showed a small acute cortical/subcortical right frontal infarcts with involvement of lateral precentral gyrus.  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  He was transferred to [facility] for further work-up.  Neurology following.  Neurology consulted IR today for consideration of cerebral angiogram and potential left ICA stent.  Undergoing ICA stent placement today.  PT/OT/speech following with no follow-up recommendation

## 2022-01-11 ENCOUNTER — Encounter (HOSPITAL_COMMUNITY): Payer: Self-pay | Admitting: Family Medicine

## 2022-01-11 ENCOUNTER — Inpatient Hospital Stay (HOSPITAL_COMMUNITY): Payer: Medicare Other

## 2022-01-11 DIAGNOSIS — C9110 Chronic lymphocytic leukemia of B-cell type not having achieved remission: Secondary | ICD-10-CM

## 2022-01-11 DIAGNOSIS — D72828 Other elevated white blood cell count: Secondary | ICD-10-CM

## 2022-01-11 DIAGNOSIS — I63233 Cerebral infarction due to unspecified occlusion or stenosis of bilateral carotid arteries: Principal | ICD-10-CM

## 2022-01-11 DIAGNOSIS — I6522 Occlusion and stenosis of left carotid artery: Secondary | ICD-10-CM

## 2022-01-11 DIAGNOSIS — I6521 Occlusion and stenosis of right carotid artery: Secondary | ICD-10-CM

## 2022-01-11 LAB — TSH: TSH: 2.632 u[IU]/mL (ref 0.350–4.500)

## 2022-01-11 LAB — HEMOGLOBIN A1C
Hgb A1c MFr Bld: 6 % — ABNORMAL HIGH (ref 4.8–5.6)
Mean Plasma Glucose: 126 mg/dL

## 2022-01-11 MED ORDER — TICAGRELOR 90 MG PO TABS
180.0000 mg | ORAL_TABLET | Freq: Once | ORAL | Status: AC
  Administered 2022-01-11: 180 mg via ORAL
  Filled 2022-01-11: qty 2

## 2022-01-11 MED ORDER — SODIUM CHLORIDE 0.9 % IV SOLN: INTRAVENOUS | Status: DC

## 2022-01-11 MED ORDER — TICAGRELOR 90 MG PO TABS
90.0000 mg | ORAL_TABLET | Freq: Two times a day (BID) | ORAL | Status: DC
  Administered 2022-01-11 – 2022-01-12 (×2): 90 mg via ORAL
  Filled 2022-01-11 (×2): qty 1

## 2022-01-11 NOTE — Evaluation (Addendum)
Physical Therapy Evaluation Patient Details Name: Daniel Osborne MRN: [id number] DOB: Feb 27, 1946 Today's Date: 01/11/2022  History of Present Illness  76 y.o. 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 PMHx: chronic leukemia and hypertension, and dyslipidemia  Clinical Impression  PTA pt living with wife in single story home with 3 steps to enter. Pt reports complete independence with ADLs, iADLs, ambulation and works as a Geophysicist/field seismologist. Pt is currently limited by decreased L UE coordination and impaired speech however pt ambulates easily at a supervision to independent level and exhibits good balance scoring 24/24 on DGI which indicates decreased risk of falling. Pt is very close to his baseline mobility. Pt will not need any equipment or post acute PT services. PT signing off     Recommendations for follow up therapy are one component of a multi-disciplinary discharge planning process, led by the attending physician.  Recommendations may be updated based on patient status, additional functional criteria and insurance authorization.  Follow Up Recommendations No PT follow up    Assistance Recommended at Discharge Set up Supervision/Assistance  Patient can return home with the following  Assist for transportation;Direct supervision/assist for financial management;Direct supervision/assist for medications management    Equipment Recommendations None recommended by PT     Functional Status Assessment Patient has had a recent decline in their functional status and demonstrates the ability to make significant improvements in function in a reasonable and predictable amount of time.     Precautions / Restrictions Precautions Precautions: Fall Precaution Comments: low fall Restrictions Weight  Bearing Restrictions: No      Mobility  Bed Mobility Overal bed mobility: Independent                  Transfers Overall transfer level: Independent                      Ambulation/Gait Ambulation/Gait assistance: Supervision, Independent Gait Distance (Feet): 500 Feet Assistive device: None Gait Pattern/deviations: Step-through pattern, WFL(Within Functional Limits) Gait velocity: WFL Gait velocity interpretation: >2.62 ft/sec, indicative of community ambulatory   General Gait Details: pt's gait is WFL, able to perform higher level balance without assist  Stairs Stairs: Yes Stairs assistance: Supervision Stair Management: Two rails, Forwards Number of Stairs: 5 (x2) General stair comments: strong steady ascent. descent of steps with minimal use of rails for steadying      Modified Rankin (Stroke Patients Only) Modified Rankin (Stroke Patients Only) Pre-Morbid Rankin Score: No symptoms Modified Rankin: No significant disability     Balance Overall balance assessment: No apparent balance deficits (not formally assessed)                               Standardized Balance Assessment Standardized Balance Assessment : Dynamic Gait Index   Dynamic Gait Index Level Surface: Normal Change in Gait Speed: Normal Gait with Horizontal Head Turns: Normal Gait with Vertical Head Turns: Mild Impairment Gait and Pivot Turn: Normal Step Over Obstacle: Normal Step Around Obstacles: Normal Steps: Normal Total Score: 23       Pertinent Vitals/Pain Pain Assessment Pain Assessment: No/denies pain    Home Living Family/patient expects to be discharged to:: Private residence Living Arrangements: Spouse/significant other Available Help at Discharge: Available 24 hours/day Type  of Home: House Home Access: Stairs to enter   [employer] of Steps: 2-3   Home Layout: One level Home Equipment: Conservation officer, nature (2 wheels);Cane - single  point      Prior Function Prior Level of Function : Independent/Modified Independent             Mobility Comments: no AD ADLs Comments: drives, works part time     Journalist, newspaper   Dominant Hand: Right    Extremity/Trunk Assessment   Upper Extremity Assessment Upper Extremity Assessment: Defer to OT evaluation LUE Deficits / Details: generally weaker than R. poor coordination. ROM is WFL. sensation is WNL. LUE Sensation: WNL LUE Coordination: decreased fine motor;decreased gross motor    Lower Extremity Assessment Lower Extremity Assessment: LLE deficits/detail LLE Deficits / Details: ROM WFL strength, 4/5 LLE Sensation: WNL LLE Coordination: WNL    Cervical / Trunk Assessment Cervical / Trunk Assessment: Normal  Communication   Communication: Expressive difficulties (slurred speech)  Cognition Arousal/Alertness: Awake/alert Behavior During Therapy: WFL for tasks assessed/performed Overall Cognitive Status: Within Functional Limits for tasks assessed                                          General Comments General comments (skin integrity, edema, etc.): VSS on RA, friends present in room left during session        Assessment/Plan    PT Assessment Patient does not need any further PT services         PT Goals (Current goals can be found in the Care Plan section)  Acute Rehab PT Goals Patient Stated Goal: go home PT Goal Formulation: With patient Time For Goal Achievement: 01/25/22 Potential to Achieve Goals: Good     AM-PAC PT "6 Clicks" Mobility  Outcome Measure Help needed turning from your back to your side while in a flat bed without using bedrails?: None Help needed moving from lying on your back to sitting on the side of a flat bed without using bedrails?: None Help needed moving to and from a bed to a chair (including a wheelchair)?: None Help needed standing up from a chair using your arms (e.g., wheelchair or bedside  chair)?: None Help needed to walk in hospital room?: None Help needed climbing 3-5 steps with a railing? : None 6 Click Score: 24    End of Session Equipment Utilized During Treatment: Gait belt Activity Tolerance: Patient tolerated treatment well Patient left: Other (comment) (in bathroom) Nurse Communication: Mobility status      Time: 3762-8315 PT Time Calculation (min) (ACUTE ONLY): 17 min   Charges:   PT Evaluation $PT Eval Moderate Complexity: 1 Mod          Illyana Schorsch B. Migdalia Dk PT, DPT Acute Rehabilitation Services Pager [phone] Office [phone]   [place] 01/11/2022, 2:06 PM

## 2022-01-11 NOTE — Progress Notes (Signed)
°  Progress Note   Patient: Daniel Osborne YDX:[id number] DOB: 12/26/1945 DOA: 01/09/2022     1 DOS: the patient was seen and examined on 01/11/2022   Brief hospital course:  76 y.o. male with medical history significant of with history of chronic leukemia and hypertension, and dyslipidemia admitted on 01/09/2022 with at [facility] hospital with complaint of left facial droop, left-sided weakness, slurred speech.  MRI of the brain showed a small acute cortical/subcortical right frontal infarcts with involvement of lateral precentral gyrus.  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  He was transferred to [facility] for further work-up.  Neurology following.  Neurology consulted IR today for consideration of cerebral angiogram and potential left ICA stent.  PT/OT/speech following  Assessment and Plan: * CVA (cerebral vascular accident) ([place])- (present on admission) - Acute CVA--POA -MRI Brain shows Small acute cortical/subcortical right frontal infarcts with involvement of lateral precentral gyrus.  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  CTA shows multiple intracranial vascular pathologies and carotid pathology as well  -Speech disturbance mostly resolved, patient has residual mild  left facial droop -Echo with EF of 60 to 72%, grade 1 diastolic dysfunction Awaiting speech, physical therapy, Occupational Therapy consult -Patient will need ongoing monitoring on telemetry -LDL 158, HDL 36 -Currently on aspirin, Lipitor -IR consulted for consideration of cerebral angiogram and potential left ICA stenting  Hypokalemia- (present on admission) Supplemented and corrected  Leukocytosis- (present on admission) - Has history of chronic leukemia and chronic leukocytosis. -We recommend to follow-up with his oncologist as an outpatient  Essential hypertension- (present on admission) Holding lisinopril for permissive hypertension -IV labetalol  as needed        Subjective:  Patient seen and examined at the bedside this morning.  Currently hemodynamically stable.  Alert and oriented.  Still has some slow speech, mild left facial droop, mild weakness on the left side.  Physical Exam: Vitals:   01/10/22 2302 01/11/22 0337 01/11/22 0740 01/11/22 1151  BP: 114/83 129/81 134/81 (!) 150/83  Pulse: 92 80 88 81  Resp: 16 18 17 16   Temp: 98.2 F (36.8 C) 98.5 F (36.9 C) 97.8 F (36.6 C) 98 F (36.7 C)  TempSrc: Oral Oral Oral Oral  SpO2: 93% 95% 92% 97%  Weight:      Height:       General exam: Overall comfortable, not in distress HEENT: PERRL, mild left facial droop Respiratory system:  no wheezes or crackles  Cardiovascular system: S1 & S2 heard, RRR.  Gastrointestinal system: Abdomen is nondistended, soft and nontender. Central nervous system: Alert and oriented, mild weakness on the left side Extremities: No edema, no clubbing ,no cyanosis Skin: No rashes, no ulcers,no icterus    Data Reviewed:    Family Communication: None at the bedside  Disposition: Status is: Inpatient      Planned Discharge Destination: Home       Author: Shelly Coss, MD 01/11/2022 11:59 AM  For on call review [email or web address].

## 2022-01-11 NOTE — Progress Notes (Signed)
°  Transition of Care [facility]) Screening Note   Patient Details  Name: Daniel Osborne Date of Birth: 1946-03-16   Transition of Care [facility]) CM/SW Contact:    Pollie Friar, RN Phone Number: 01/11/2022, 1:47 PM    Transition of Care Department [facility]) has reviewed patient. We will continue to monitor patient advancement through interdisciplinary progression rounds. If new patient transition needs arise, please place a TOC consult.

## 2022-01-11 NOTE — Progress Notes (Signed)
° ° °  Pt had eaten breakfast this am at 9 am. Anesthesia has scheduled this procedure for 2/9 at 1100 am  New orders placed RN and pt aware

## 2022-01-11 NOTE — Evaluation (Signed)
Speech Language Pathology Evaluation Patient Details Name: Daniel Osborne MRN: [id number] DOB: 1946/01/02 Today's Date: 01/11/2022 Time: 7591-6384 SLP Time Calculation (min) (ACUTE ONLY): 18 min  Problem List:  Patient Active Problem List   Diagnosis Date Noted   Acute CVA (cerebrovascular accident) ([place]) 01/10/2022   CVA (cerebral vascular accident) ([place]) 01/09/2022   Hypokalemia 01/09/2022   Essential hypertension 01/09/2022   Leukocytosis 01/09/2022   Past Medical History:  Past Medical History:  Diagnosis Date   Chronic Leukemia February 2014   Hypertension    Past Surgical History:  Past Surgical History:  Procedure Laterality Date   TONSILLECTOMY AND ADENOIDECTOMY     HPI:  Pt is a 75 y.o. male who presented with left side facial droop. MRI Brain 2/7: Small acute cortical/subcortical right frontal infarcts with involvement of lateral precentral gyrus. 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 PMH: chronic leukemia and hypertension, and dyslipidemia   Assessment / Plan / Recommendation Clinical Impression  Pt participated in speech/language/cognition evaluation with his sister and brother-in-law present for part of the evaluation. 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 Pt stated that he may have some difficulty with memory at baseline and he denied any acute changes in language/cognition. He described his speech as "not clear" and he stated that it is ~40% back to baseline. [facility] Mental Status Examination was completed to evaluate the pt's cognitive-linguistic skills. He achieved a score of 21/30 which is below the normal limits of 27 or more out of 30 and is suggestive of a mild impairment. He exhibited difficulty in the areas of memory, selective attention, and executive function. He also presented with moderate dysarthria characterized by reduction in articulatory precision, vocal intensity,  and coordination of  respiration with speech; these negatively impacted speech intelligibility at the sentence and conversational levels. Skilled SLP services are clinically indicated at this time to improve motor speech skills. Pt reported that he believes his cognition is at baseline, but he verbalized agreement with letting his medical team know if he subsequently believes that there has been a change.    SLP Assessment  SLP Recommendation/Assessment: Patient needs continued Speech Lanaguage Pathology Services SLP Visit Diagnosis: Dysarthria and anarthria (R47.1);Cognitive communication deficit (R41.841)    Recommendations for follow up therapy are one component of a multi-disciplinary discharge planning process, led by the attending physician.  Recommendations may be updated based on patient status, additional functional criteria and insurance authorization.    Follow Up Recommendations  Outpatient SLP    Assistance Recommended at Discharge  Intermittent Supervision/Assistance  Functional Status Assessment Patient has had a recent decline in their functional status and demonstrates the ability to make significant improvements in function in a reasonable and predictable amount of time.  Frequency and Duration min 2x/week  2 weeks      SLP Evaluation Cognition  Overall Cognitive Status: History of cognitive impairments - at baseline Arousal/Alertness: Awake/alert Orientation Level: Oriented X4 Year: 2023 Month: February Day of Week: Correct Attention: Focused;Sustained;Selective Focused Attention: Appears intact Sustained Attention: Appears intact Selective Attention: Impaired Selective Attention Impairment: Verbal complex Memory: Impaired Memory Impairment: Retrieval deficit;Decreased short term memory;Decreased recall of new information (Immediate: 5/5 with repetition; delayed: 4/5; with cue: 1/1) Awareness: Appears intact Problem Solving: Appears intact (Money problems:  3/3) Executive Function: Sequencing;Organizing Sequencing:  (Clock drawing: 2/4) Organizing: Impaired Organizing Impairment: Verbal complex (backward digit span: 1/2)  Comprehension  Auditory Comprehension Overall Auditory Comprehension: Appears within functional limits for tasks assessed Yes/No Questions: Within Functional Limits Commands: Within Functional Limits Conversation: Complex    Expression Expression Primary Mode of Expression: Verbal Verbal Expression Overall Verbal Expression: Appears within functional limits for tasks assessed Initiation: No impairment Naming: No impairment Pragmatics: No impairment   Oral / Motor  Oral Motor/Sensory Function Overall Oral Motor/Sensory Function: Mild impairment Facial ROM: Reduced left;Suspected CN VII (facial) dysfunction Facial Strength: Reduced left;Suspected CN VII (facial) dysfunction Lingual Strength: Reduced;Suspected CN XII (hypoglossal) dysfunction Lingual Sensation: Within Functional Limits Velum: Within Functional Limits Motor Speech Overall Motor Speech: Impaired Respiration: Impaired Level of Impairment: Phrase Phonation: Low vocal intensity Resonance: Within functional limits Articulation: Impaired Level of Impairment: Sentence Intelligibility: Intelligibility reduced Word: 75-100% accurate Phrase: 75-100% accurate Sentence: 50-74% accurate Conversation: 25-49% accurate Motor Planning: Witnin functional limits Motor Speech Errors: Aware;Consistent Effective Techniques: Slow rate;Increased vocal intensity;Over-articulate           Charleen [place] I. Hardin Negus, [place], [place] Office number [phone] Pager [place] 01/11/2022, 1:09 PM

## 2022-01-11 NOTE — Consult Note (Signed)
NEURO HOSPITALIST CONSULT NOTE   Requestig physician: Dr. Denton Brick  Reason for Consult: [place] with slurred speech and left facial droop  History obtained from:  Patient and Chart     HPI:                                                                                                                                          Daniel Osborne is an 76 y.o. male with a PMHx of HTN and chronic leukemia who presented to the [facility] ED on Monday afternoon with chief complaints of new onset left sided facial droop, drooling and pocketing of food in the left side of his mouth while eating. He woke up at 5 AM  on Monday without symptoms. At 10 AM he started having slurred speech. He tried to take a drink and the liquid dribbled out of the left corner of his mouth. At the time of presentation to the [facility] ED, he denied vision changes, numbness, limb weakness, CP, headache and SOB.   Past Medical History:  Diagnosis Date   Chronic Leukemia February 2014   Hypertension     Past Surgical History:  Procedure Laterality Date   TONSILLECTOMY AND ADENOIDECTOMY      Family History  Problem Relation Age of Onset   Stroke Mother    Cancer Father        leukemia   Cancer Sister        breast           Social History:  reports that he has never smoked. He has never used smokeless tobacco. He reports that he does not drink alcohol and does not use drugs. He works part-time driving a truck.  No Known Allergies  MEDICATIONS:                                                                                                                     Prior to Admission:  Medications Prior to Admission  Medication Sig Dispense Refill Last Dose   acetaminophen (TYLENOL) 500 MG tablet Take 500 mg by mouth every 6 (six) hours as needed.   01/09/2022   lisinopril (ZESTRIL) 10 MG tablet Take 5 mg by mouth daily.   01/09/2022   amoxicillin-clavulanate (AUGMENTIN) 875-125 MG tablet TAKE  1 TABLET BY MOUTH 2  (TWO) TIMES DAILY. (Patient not taking: Reported on 01/09/2022) 28 tablet 0 Completed Course   fluticasone (FLONASE) 50 MCG/ACT nasal spray Place 2 sprays into both nostrils daily. (Patient not taking: Reported on 01/09/2022) 16 g 6 Not Taking   Scheduled:   stroke: mapping our early stages of recovery book   Does not apply Once   aspirin EC  81 mg Oral Daily   atorvastatin  80 mg Oral Daily   heparin  5,000 Units Subcutaneous Q8H   ticagrelor  180 mg Oral Once   Followed by   ticagrelor  90 mg Oral BID   Continuous:  sodium chloride       ROS:                                                                                                                                       As per HPI.    Blood pressure 114/83, pulse 92, temperature 98.2 F (36.8 C), temperature source Oral, resp. rate 16, height 5\' 9"  (1.753 m), weight 77.2 kg, SpO2 93 %.   General Examination:                                                                                                       Physical Exam  HEENT-  [place]/AT   Lungs- Respirations unlabored Extremities- No edema  Neurological Examination Mental Status: Alert, oriented x 5, thought content appropriate.  Speech dysarthric but fluent without evidence of expressive or receptive aphasia.  Able to follow all commands without difficulty. Cranial Nerves: II: Temporal visual fields intact with no extinction to DSS. Pupils equal.   III,IV, VI: EOMI with saccadic pursuits noted.  V: FT sensation equal bilaterally  VII: Left facial droop.  VIII: Hearing intact to voice IX,X: No hypophonia XI: Symmetric XII: Midline tongue extension Motor: RUE 5/5 LUE 4+/5 BLE 5/5 No pronator drift.  Sensory: FT intact x 4.  No extinction to DSS.  Deep Tendon Reflexes: 2+ and symmetric throughout Cerebellar: No ataxia with FNF bilaterally. Slowed movements on the left.  Gait: Deferred   Lab Results: Basic Metabolic Panel: Recent Labs  Lab 01/09/22 1620  01/10/22 0410  NA 140 141  K 3.4* 4.0  CL 104 108  CO2 29 26  GLUCOSE 88 90  BUN 22 20  CREATININE 1.28* 1.17  CALCIUM 9.4 9.0  MG  --  1.9    CBC: Recent Labs  Lab 01/09/22 1620 01/10/22 0410  WBC 40.0* 40.4*  NEUTROABS 3.9  --   HGB 13.0 12.7*  HCT 41.5 41.7  MCV 97.2 97.9  PLT 182 150    Cardiac Enzymes: No results for input(s): CKTOTAL, CKMB, CKMBINDEX, TROPONINI in the last 168 hours.  Lipid Panel: Recent Labs  Lab 01/10/22 0410  CHOL 218*  TRIG 119  HDL 36*  CHOLHDL 6.1  VLDL 24  LDLCALC 158*    Imaging: CT ANGIO HEAD NECK W WO CM  Result Date: 01/09/2022 CLINICAL DATA:  Neuro deficit, acute, stroke suspected EXAM: CT ANGIOGRAPHY HEAD AND NECK TECHNIQUE: Multidetector CT imaging of the head and neck was performed using the standard protocol during bolus administration of intravenous contrast. Multiplanar CT image reconstructions and MIPs were obtained to evaluate the vascular anatomy. Carotid stenosis measurements (when applicable) are obtained utilizing NASCET criteria, using the distal internal carotid diameter as the denominator. RADIATION DOSE REDUCTION: This exam was performed according to the departmental dose-optimization program which includes automated exposure control, adjustment of the mA and/or kV according to patient size and/or use of iterative reconstruction technique. CONTRAST:  65mL OMNIPAQUE IOHEXOL 350 MG/ML SOLN COMPARISON:  None. FINDINGS: CT HEAD FINDINGS Brain: Patchy hypoattenuation in the right frontal white matter and bilateral basal ganglia. No acute hemorrhage, mass lesion, midline shift, hydrocephalus or visible extra-axial fluid collection. Mild atrophy. Vascular: See below. Skull: No acute fracture. Sinuses: Small right maxillary sinus with mild mucosal thickening. Otherwise, clear sinuses. Orbits: No acute finding. Review of the MIP images confirms the above findings CTA NECK FINDINGS Aortic arch: Great vessel origins are patent. Right  carotid system: Mixed calcific and noncalcific atherosclerosis at the carotid bifurcation. 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 The ICA remains non-opacified in the neck. Left carotid system: Mixed calcific and noncalcific atherosclerosis at the carotid bifurcation and involving the proximal ICA. Approximately 70-80% stenosis of the proximal ICA. Vertebral arteries: Co dominant. Severe right vertebral artery origin stenosis. Otherwise, vertebral arteries are patent without significant stenosis. Skeleton: No evidence of acute abnormality on limited assessment. Other neck: Increased number of lymph nodes in the neck bilaterally and in the visualized upper mediastinum with enlarged bilateral submandibular and upper cervical chain nodes. Left level 2 node measures up to 1.5 cm short axis and is rounded. Submandibular nodes measure up to approximately 1.3 cm short axis on the right. Upper chest: Visualized lung apices are clear. Review of the MIP images confirms the above findings CTA HEAD FINDINGS Anterior circulation: Reconstitution of the right ICA at the proximal petrous segment. Bilateral intracranial ICA atherosclerosis with moderate to severe bilateral paraclinoid ICA stenosis. Bilateral MCAs are patent with multifocal mild-to-moderate M1 and M2 MCA stenosis. Small right A1 ACA, probably congenital given prominent left A1 ACA. Otherwise, ACAs are patent without proximal high-grade stenosis Posterior circulation: Bilateral intradural vertebral arteries are patent. Severe stenosis of the distal right intradural vertebral artery. Moderate stenosis of the distal left intradural vertebral artery. Basilar artery is patent with multifocal mild stenosis. Multifocal severe left P1 and P2 PCA stenosis. Moderate right P1 PCA stenosis. Venous sinuses: As permitted by contrast timing, patent. Review of the MIP images confirms the above findings IMPRESSION: CT head: 1. Patchy hypoattenuation in the right frontal white matter and  bilateral basal ganglia, which could represent chronic microvascular ischemic disease versus age indeterminate infarcts in the absence of priors. MRI could provide more sensitive evaluation for acute infarct. 2. No evidence of acute hemorrhage. CTA: 1. 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 2. Moderate to severe bilateral paraclinoid ICA stenosis. 3. Multifocal severe left P1 and P2 PCA stenosis. Moderate right P1 PCA stenosis. 4. Severe right vertebral artery origin stenosis. Also, severe right and moderate left distal intradural vertebral artery stenosis. 5. Approximately 70-80% stenosis of the proximal left ICA in the neck. 6. Multifocal mild-to-moderate bilateral M1 and M2 MCA stenosis 7. Increased number of lymph nodes in the neck bilaterally and in the visualized upper mediastinum with enlarged and somewhat rounded submandibular and upper cervical chain nodes. While nonspecific, findings raise concern for underlying lymphoproliferative disorder. Consider follow-up CT neck and possibly CT chest/abdomen/pelvis for further evaluation. Findings discussed with Ileene Patrick PA via telephone at 7:16 PM Electronically Signed   By: Margaretha Sheffield M.D.   On: 01/09/2022 19:19   MR BRAIN WO CONTRAST  Result Date: 01/10/2022 CLINICAL DATA:  Neuro deficit, acute, stroke suspected EXAM: MRI HEAD WITHOUT CONTRAST TECHNIQUE: Multiplanar, multiecho pulse sequences of the brain and surrounding structures were obtained without intravenous contrast. COMPARISON:  None. FINDINGS: Brain: There is cortical/subcortical reduced diffusion in the right frontal lobe including involvement of lateral precentral gyrus. Patchy and confluent areas of T2 hyperintensity in the supratentorial white matter nonspecific but probably reflect mild to moderate chronic microvascular ischemic changes. There are chronic right frontoparietal  cortical infarcts. Small chronic infarct of the right frontal subcortical white matter. Prominent perivascular spaces and probable superimposed chronic small vessel infarcts of the central gray nuclei and white matter. Chronic blood products along the right basal ganglia and adjacent white matter. No intracranial mass or mass effect. There is no hydrocephalus or extra-axial fluid collection. Prominence of the ventricles and sulci reflects mild parenchymal volume loss. Vascular: Diminished right ICA flow void. Skull and upper cervical spine: Marrow signal is mildly heterogeneous but otherwise unremarkable. Sinuses/Orbits: Mild mucosal thickening.  Orbits are unremarkable. Other: Sella is unremarkable.  Mastoid air cells are clear. IMPRESSION: Small acute cortical/subcortical right frontal infarcts with involvement of lateral precentral gyrus. 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 Electronically Signed   By: Macy Mis M.D.   On: 01/10/2022 09:41   ECHOCARDIOGRAM COMPLETE  Result Date: 01/10/2022    ECHOCARDIOGRAM REPORT   Patient Name:   Daniel Osborne Date of Exam: 01/10/2022 Medical Rec #:  [id number]       Height:       69.0 in Accession #:    [id number]      Weight:       170.2 lb Date of Birth:  08/30/1946       BSA:          1.929 m Patient Age:    30 years        BP:           141/84 mmHg Patient Gender: M               HR:           81 bpm. Exam Location:  [facility] Procedure: 2D Echo, Cardiac Doppler and Color Doppler Indications:    Stroke I63.9  History:        Patient has no prior history of Echocardiogram examinations.                 Risk Factors:Hypertension. CVA (cerebral vascular accident)                 ([place]).  Sonographer:  Alvino Chapel RCS Referring Phys: [id number] ASIA B [place]  1. Left ventricular ejection fraction, by estimation, is 60 to 65%. The left ventricle has normal function. The left ventricle has no regional wall motion abnormalities.  Left ventricular diastolic parameters are consistent with Grade I diastolic dysfunction (impaired relaxation).  2. Right ventricular systolic function is normal. The right ventricular size is normal. Tricuspid regurgitation signal is inadequate for assessing PA pressure.  3. The mitral valve is degenerative. Trivial mitral valve regurgitation. No evidence of mitral stenosis.  4. The aortic valve is tricuspid. Aortic valve regurgitation is trivial. Aortic valve sclerosis is present, with no evidence of aortic valve stenosis.  5. The inferior vena cava is normal in size with greater than 50% respiratory variability, suggesting right atrial pressure of 3 mmHg. Comparison(s): No prior Echocardiogram. FINDINGS  Left Ventricle: Left ventricular ejection fraction, by estimation, is 60 to 65%. The left ventricle has normal function. The left ventricle has no regional wall motion abnormalities. The left ventricular internal cavity size was normal in size. There is  no left ventricular hypertrophy. Left ventricular diastolic parameters are consistent with Grade I diastolic dysfunction (impaired relaxation). Right Ventricle: The right ventricular size is normal. No increase in right ventricular wall thickness. Right ventricular systolic function is normal. Tricuspid regurgitation signal is inadequate for assessing PA pressure. Left Atrium: Left atrial size was normal in size. Right Atrium: Right atrial size was normal in size. Pericardium: There is no evidence of pericardial effusion. Mitral Valve: The mitral valve is degenerative in appearance. Trivial mitral valve regurgitation. No evidence of mitral valve stenosis. Tricuspid Valve: The tricuspid valve is normal in structure. Tricuspid valve regurgitation is trivial. No evidence of tricuspid stenosis. Aortic Valve: The aortic valve is tricuspid. Aortic valve regurgitation is trivial. Aortic valve sclerosis is present, with no evidence of aortic valve stenosis. Aortic valve  mean gradient measures 12.0 mmHg. Aortic valve peak gradient measures 20.6 mmHg. Aortic valve area, by VTI measures 1.42 cm. Pulmonic Valve: The pulmonic valve was not well visualized. Pulmonic valve regurgitation is not visualized. No evidence of pulmonic stenosis. Aorta: The aortic root is normal in size and structure. Venous: The inferior vena cava is normal in size with greater than 50% respiratory variability, suggesting right atrial pressure of 3 mmHg. IAS/Shunts: No atrial level shunt detected by color flow Doppler.  LEFT VENTRICLE PLAX 2D LVIDd:         4.20 cm   Diastology LVIDs:         2.80 cm   LV e' medial:    7.94 cm/s LV PW:         1.00 cm   LV E/e' medial:  11.6 LV IVS:        0.90 cm   LV e' lateral:   5.98 cm/s LVOT diam:     1.90 cm   LV E/e' lateral: 15.5 LV SV:         67 LV SV Index:   35 LVOT Area:     2.84 cm  RIGHT VENTRICLE RV S prime:     12.10 cm/s TAPSE (M-mode): 2.1 cm LEFT ATRIUM             Index        RIGHT ATRIUM           Index LA diam:        3.60 cm 1.87 cm/m   RA Area:     15.70 cm LA Vol (A2C):  62.3 ml 32.30 ml/m  RA Volume:   40.20 ml  20.84 ml/m LA Vol (A4C):   45.2 ml 23.43 ml/m LA Biplane Vol: 55.0 ml 28.51 ml/m  AORTIC VALVE AV Area (Vmax):    1.44 cm AV Area (Vmean):   1.28 cm AV Area (VTI):     1.42 cm AV Vmax:           227.00 cm/s AV Vmean:          160.000 cm/s AV VTI:            0.470 m AV Peak Grad:      20.6 mmHg AV Mean Grad:      12.0 mmHg LVOT Vmax:         115.00 cm/s LVOT Vmean:        72.000 cm/s LVOT VTI:          0.235 m LVOT/AV VTI ratio: 0.50  AORTA Ao Root diam: 3.10 cm MITRAL VALVE MV Area (PHT): 3.37 cm     SHUNTS MV Decel Time: 225 msec     Systemic VTI:  0.24 m MV E velocity: 92.50 cm/s   Systemic Diam: 1.90 cm MV A velocity: 111.00 cm/s MV E/A ratio:  0.83 Rudean Haskell MD Electronically signed by Rudean Haskell MD Signature Date/Time: 01/10/2022/12:08:44 PM    Final      Assessment: 76 year old male transferred to [facility]  from [facility] for further evaluation and management of an acute stroke with slurred speech and left facial droop 1. Exam reveals left facial droop, dysarthria and mild LUE weakness.  2. CT head: Patchy hypoattenuation in the right frontal white matter and bilateral basal ganglia, which could represent chronic microvascular ischemic disease versus age indeterminate infarcts in the absence of priors.  3. 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 Moderate to severe bilateral paraclinoid ICA stenosis. Multifocal severe left P1 and P2 PCA stenosis. Moderate right P1 PCA stenosis. Severe right vertebral artery origin stenosis. Also, severe right and moderate left distal intradural vertebral artery stenosis. Approximately 70-80% stenosis of the proximal left ICA in the neck. Multifocal mild-to-moderate bilateral M1 and M2 MCA stenosis  4. Also noted on CTA are an Increased number of lymph nodes in the neck bilaterally and in the visualized upper mediastinum with enlarged and somewhat rounded submandibular and upper cervical chain nodes. While nonspecific, findings raise concern for underlying lymphoproliferative disorder.  5. MRI brain: Small acute cortical/subcortical right frontal infarcts with involvement of lateral precentral gyrus. 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  6. TTE: No mural thrombus or valvular vegetation mentioned in the report.  7. Stroke risk factors: HTN and leukemia  Recommendations: 1. HgbA1c, fasting lipid panel 2. Has been started on ASA and atorvastatin. 3. PT consult, OT consult, Speech consult 4. Telemetry monitoring 5. Frequent neuro checks 6. Evaluation and management of chronic leukemia per primary team. Please note CT findings of multifocal lymphadenopathy documented above.  7. BP management.   Electronically signed: Dr. Kerney Elbe 01/11/2022, 2:17 AM

## 2022-01-11 NOTE — Progress Notes (Signed)
STROKE TEAM PROGRESS NOTE   SUBJECTIVE (INTERVAL HISTORY) No family is at the bedside.  Overall his condition is stable. He is sitting in chair, finished breakfast around 9am. He still has mild left facial droop, left hand weakness and slurry speech. 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 No right PCOM seen but ACOM present. Discussed with IR Dr. Norma Fredrickson, will consider left ICA stenting  He has CLL with elevated WBC [facility]. Per pt, his baseline WBC is around 40, no specific treatment as long as it is stable. Currently 40.4.   OBJECTIVE Temp:  [97.8 F (36.6 C)-98.5 F (36.9 C)] 97.8 F (36.6 C) (02/08 0740) Pulse Rate:  [79-98] 88 (02/08 0740) Cardiac Rhythm: Normal sinus rhythm;Bundle branch block (02/08 0739) Resp:  [14-25] 17 (02/08 0740) BP: (114-181)/(81-98) 134/81 (02/08 0740) SpO2:  [92 %-99 %] 92 % (02/08 0740)  No results for input(s): GLUCAP in the last 168 hours. Recent Labs  Lab 01/09/22 1620 01/10/22 0410  NA 140 141  K 3.4* 4.0  CL 104 108  CO2 29 26  GLUCOSE 88 90  BUN 22 20  CREATININE 1.28* 1.17  CALCIUM 9.4 9.0  MG  --  1.9   Recent Labs  Lab 01/10/22 0410  AST 17  ALT 13  ALKPHOS 60  BILITOT 0.4  PROT 6.5  ALBUMIN 3.5   Recent Labs  Lab 01/09/22 1620 01/10/22 0410  WBC 40.0* 40.4*  NEUTROABS 3.9  --   HGB 13.0 12.7*  HCT 41.5 41.7  MCV 97.2 97.9  PLT 182 150   No results for input(s): CKTOTAL, CKMB, CKMBINDEX, TROPONINI in the last 168 hours. Recent Labs    01/09/22 1620  LABPROT 13.3  INR 1.0   No results for input(s): COLORURINE, LABSPEC, PHURINE, GLUCOSEU, HGBUR, BILIRUBINUR, KETONESUR, PROTEINUR, UROBILINOGEN, NITRITE, LEUKOCYTESUR in the last 72 hours.  Invalid input(s): APPERANCEUR     Component Value Date/Time   CHOL 218 (H) 01/10/2022 0410   TRIG 119 01/10/2022 0410   HDL 36 (L) 01/10/2022 0410   CHOLHDL 6.1 01/10/2022 0410    VLDL 24 01/10/2022 0410   LDLCALC 158 (H) 01/10/2022 0410   Lab Results  Component Value Date   HGBA1C 6.0 (H) 01/10/2022   No results found for: LABOPIA, COCAINSCRNUR, LABBENZ, AMPHETMU, THCU, LABBARB  No results for input(s): ETH in the last 168 hours.  I have personally reviewed the radiological images below and agree with the radiology interpretations.  CT ANGIO HEAD NECK W WO CM  Result Date: 01/09/2022 CLINICAL DATA:  Neuro deficit, acute, stroke suspected EXAM: CT ANGIOGRAPHY HEAD AND NECK TECHNIQUE: Multidetector CT imaging of the head and neck was performed using the standard protocol during bolus administration of intravenous contrast. Multiplanar CT image reconstructions and MIPs were obtained to evaluate the vascular anatomy. Carotid stenosis measurements (when applicable) are obtained utilizing NASCET criteria, using the distal internal carotid diameter as the denominator. RADIATION DOSE REDUCTION: This exam was performed according to the departmental dose-optimization program which includes automated exposure control, adjustment of the mA and/or kV according to patient size and/or use of iterative reconstruction technique. CONTRAST:  49mL OMNIPAQUE IOHEXOL 350 MG/ML SOLN COMPARISON:  None. FINDINGS: CT HEAD FINDINGS Brain: Patchy hypoattenuation in the right frontal white matter and bilateral basal ganglia. No acute hemorrhage, mass lesion, midline shift, hydrocephalus or visible extra-axial fluid collection. Mild atrophy. Vascular: See below. Skull: No acute fracture. Sinuses: Small  right maxillary sinus with mild mucosal thickening. Otherwise, clear sinuses. Orbits: No acute finding. Review of the MIP images confirms the above findings CTA NECK FINDINGS Aortic arch: Great vessel origins are patent. Right carotid system: Mixed calcific and noncalcific atherosclerosis at the carotid bifurcation. 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 The ICA remains non-opacified in the neck. Left carotid  system: Mixed calcific and noncalcific atherosclerosis at the carotid bifurcation and involving the proximal ICA. Approximately 70-80% stenosis of the proximal ICA. Vertebral arteries: Co dominant. Severe right vertebral artery origin stenosis. Otherwise, vertebral arteries are patent without significant stenosis. Skeleton: No evidence of acute abnormality on limited assessment. Other neck: Increased number of lymph nodes in the neck bilaterally and in the visualized upper mediastinum with enlarged bilateral submandibular and upper cervical chain nodes. Left level 2 node measures up to 1.5 cm short axis and is rounded. Submandibular nodes measure up to approximately 1.3 cm short axis on the right. Upper chest: Visualized lung apices are clear. Review of the MIP images confirms the above findings CTA HEAD FINDINGS Anterior circulation: Reconstitution of the right ICA at the proximal petrous segment. Bilateral intracranial ICA atherosclerosis with moderate to severe bilateral paraclinoid ICA stenosis. Bilateral MCAs are patent with multifocal mild-to-moderate M1 and M2 MCA stenosis. Small right A1 ACA, probably congenital given prominent left A1 ACA. Otherwise, ACAs are patent without proximal high-grade stenosis Posterior circulation: Bilateral intradural vertebral arteries are patent. Severe stenosis of the distal right intradural vertebral artery. Moderate stenosis of the distal left intradural vertebral artery. Basilar artery is patent with multifocal mild stenosis. Multifocal severe left P1 and P2 PCA stenosis. Moderate right P1 PCA stenosis. Venous sinuses: As permitted by contrast timing, patent. Review of the MIP images confirms the above findings IMPRESSION: CT head: 1. Patchy hypoattenuation in the right frontal white matter and bilateral basal ganglia, which could represent chronic microvascular ischemic disease versus age indeterminate infarcts in the absence of priors. MRI could provide more sensitive  evaluation for acute infarct. 2. No evidence of acute hemorrhage. CTA: 1. 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 2. Moderate to severe bilateral paraclinoid ICA stenosis. 3. Multifocal severe left P1 and P2 PCA stenosis. Moderate right P1 PCA stenosis. 4. Severe right vertebral artery origin stenosis. Also, severe right and moderate left distal intradural vertebral artery stenosis. 5. Approximately 70-80% stenosis of the proximal left ICA in the neck. 6. Multifocal mild-to-moderate bilateral M1 and M2 MCA stenosis 7. Increased number of lymph nodes in the neck bilaterally and in the visualized upper mediastinum with enlarged and somewhat rounded submandibular and upper cervical chain nodes. While nonspecific, findings raise concern for underlying lymphoproliferative disorder. Consider follow-up CT neck and possibly CT chest/abdomen/pelvis for further evaluation. Findings discussed with Ileene Patrick PA via telephone at 7:16 PM Electronically Signed   By: Margaretha Sheffield M.D.   On: 01/09/2022 19:19   MR BRAIN WO CONTRAST  Result Date: 01/10/2022 CLINICAL DATA:  Neuro deficit, acute, stroke suspected EXAM: MRI HEAD WITHOUT CONTRAST TECHNIQUE: Multiplanar, multiecho pulse sequences of the brain and surrounding structures were obtained without intravenous contrast. COMPARISON:  None. FINDINGS: Brain: There is cortical/subcortical reduced diffusion in the right frontal lobe including involvement of lateral precentral gyrus. Patchy and confluent areas of T2 hyperintensity in the supratentorial white matter nonspecific but probably reflect mild to moderate chronic microvascular ischemic changes. There are chronic right frontoparietal cortical infarcts.  Small chronic infarct of the right frontal subcortical white matter. Prominent perivascular spaces and probable superimposed chronic small vessel infarcts of the  central gray nuclei and white matter. Chronic blood products along the right basal ganglia and adjacent white matter. No intracranial mass or mass effect. There is no hydrocephalus or extra-axial fluid collection. Prominence of the ventricles and sulci reflects mild parenchymal volume loss. Vascular: Diminished right ICA flow void. Skull and upper cervical spine: Marrow signal is mildly heterogeneous but otherwise unremarkable. Sinuses/Orbits: Mild mucosal thickening.  Orbits are unremarkable. Other: Sella is unremarkable.  Mastoid air cells are clear. IMPRESSION: Small acute cortical/subcortical right frontal infarcts with involvement of lateral precentral gyrus. 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 Electronically Signed   By: Macy Mis M.D.   On: 01/10/2022 09:41   ECHOCARDIOGRAM COMPLETE  Result Date: 01/10/2022    ECHOCARDIOGRAM REPORT   Patient Name:   YUJI WALTH Date of Exam: 01/10/2022 Medical Rec #:  [id number]       Height:       69.0 in Accession #:    [id number]      Weight:       170.2 lb Date of Birth:  11/06/1946       BSA:          1.929 m Patient Age:    70 years        BP:           141/84 mmHg Patient Gender: M               HR:           81 bpm. Exam Location:  [facility] Procedure: 2D Echo, Cardiac Doppler and Color Doppler Indications:    Stroke I63.9  History:        Patient has no prior history of Echocardiogram examinations.                 Risk Factors:Hypertension. CVA (cerebral vascular accident)                 ([place]da).  Sonographer:    Alvino Chapel RCS Referring Phys: [id number] ASIA B Heath  1. Left ventricular ejection fraction, by estimation, is 60 to 65%. The left ventricle has normal function. The left ventricle has no regional wall motion abnormalities. Left ventricular diastolic parameters are consistent with Grade I diastolic dysfunction (impaired relaxation).  2. Right ventricular systolic function is normal. The right ventricular  size is normal. Tricuspid regurgitation signal is inadequate for assessing PA pressure.  3. The mitral valve is degenerative. Trivial mitral valve regurgitation. No evidence of mitral stenosis.  4. The aortic valve is tricuspid. Aortic valve regurgitation is trivial. Aortic valve sclerosis is present, with no evidence of aortic valve stenosis.  5. The inferior vena cava is normal in size with greater than 50% respiratory variability, suggesting right atrial pressure of 3 mmHg. Comparison(s): No prior Echocardiogram. FINDINGS  Left Ventricle: Left ventricular ejection fraction, by estimation, is 60 to 65%. The left ventricle has normal function. The left ventricle has no regional wall motion abnormalities. The left ventricular internal cavity size was normal in size. There is  no left ventricular hypertrophy. Left ventricular diastolic parameters are consistent with Grade I diastolic dysfunction (impaired relaxation). Right Ventricle: The right ventricular size is normal. No increase in right ventricular wall thickness. Right ventricular systolic function is normal. Tricuspid regurgitation signal is inadequate for assessing PA pressure. Left Atrium: Left atrial size was normal  in size. Right Atrium: Right atrial size was normal in size. Pericardium: There is no evidence of pericardial effusion. Mitral Valve: The mitral valve is degenerative in appearance. Trivial mitral valve regurgitation. No evidence of mitral valve stenosis. Tricuspid Valve: The tricuspid valve is normal in structure. Tricuspid valve regurgitation is trivial. No evidence of tricuspid stenosis. Aortic Valve: The aortic valve is tricuspid. Aortic valve regurgitation is trivial. Aortic valve sclerosis is present, with no evidence of aortic valve stenosis. Aortic valve mean gradient measures 12.0 mmHg. Aortic valve peak gradient measures 20.6 mmHg. Aortic valve area, by VTI measures 1.42 cm. Pulmonic Valve: The pulmonic valve was not well visualized.  Pulmonic valve regurgitation is not visualized. No evidence of pulmonic stenosis. Aorta: The aortic root is normal in size and structure. Venous: The inferior vena cava is normal in size with greater than 50% respiratory variability, suggesting right atrial pressure of 3 mmHg. IAS/Shunts: No atrial level shunt detected by color flow Doppler.  LEFT VENTRICLE PLAX 2D LVIDd:         4.20 cm   Diastology LVIDs:         2.80 cm   LV e' medial:    7.94 cm/s LV PW:         1.00 cm   LV E/e' medial:  11.6 LV IVS:        0.90 cm   LV e' lateral:   5.98 cm/s LVOT diam:     1.90 cm   LV E/e' lateral: 15.5 LV SV:         67 LV SV Index:   35 LVOT Area:     2.84 cm  RIGHT VENTRICLE RV S prime:     12.10 cm/s TAPSE (M-mode): 2.1 cm LEFT ATRIUM             Index        RIGHT ATRIUM           Index LA diam:        3.60 cm 1.87 cm/m   RA Area:     15.70 cm LA Vol (A2C):   62.3 ml 32.30 ml/m  RA Volume:   40.20 ml  20.84 ml/m LA Vol (A4C):   45.2 ml 23.43 ml/m LA Biplane Vol: 55.0 ml 28.51 ml/m  AORTIC VALVE AV Area (Vmax):    1.44 cm AV Area (Vmean):   1.28 cm AV Area (VTI):     1.42 cm AV Vmax:           227.00 cm/s AV Vmean:          160.000 cm/s AV VTI:            0.470 m AV Peak Grad:      20.6 mmHg AV Mean Grad:      12.0 mmHg LVOT Vmax:         115.00 cm/s LVOT Vmean:        72.000 cm/s LVOT VTI:          0.235 m LVOT/AV VTI ratio: 0.50  AORTA Ao Root diam: 3.10 cm MITRAL VALVE MV Area (PHT): 3.37 cm     SHUNTS MV Decel Time: 225 msec     Systemic VTI:  0.24 m MV E velocity: 92.50 cm/s   Systemic Diam: 1.90 cm MV A velocity: 111.00 cm/s MV E/A ratio:  0.83 Rudean Haskell MD Electronically signed by Rudean Haskell MD Signature Date/Time: 01/10/2022/12:08:44 PM    Final      PHYSICAL EXAM  Temp:  [97.8 F (36.6 C)-98.5 F (36.9 C)] 97.8 F (36.6 C) (02/08 0740) Pulse Rate:  [79-98] 88 (02/08 0740) Resp:  [14-25] 17 (02/08 0740) BP: (114-181)/(81-98) 134/81 (02/08 0740) SpO2:  [92 %-99 %] 92 %  (02/08 0740)  General - Well nourished, well developed, in no apparent distress.  Ophthalmologic - fundi not visualized due to noncooperation.  Cardiovascular - Regular rhythm and rate.  Mental Status -  Level of arousal and orientation to time, place, and person were intact. Language including expression, naming, repetition, comprehension was assessed and found intact. Mild dysarthria Fund of Knowledge was assessed and was intact.  Cranial Nerves II - XII - II - Visual field intact OU. III, IV, VI - Extraocular movements intact. V - Facial sensation decreased on the left. VII - Facial droop on the left. VIII - Hearing & vestibular intact bilaterally. X - Palate elevates symmetrically. XI - Chin turning & shoulder shrug intact bilaterally. XII - Tongue protrusion intact.  Motor Strength - The patients strength was normal in all extremities and pronator drift was absent except 4+/5 left finger grip and decreased left hand dexterity.  Bulk was normal and fasciculations were absent.   Motor Tone - Muscle tone was assessed at the neck and appendages and was normal.  Reflexes - The patients reflexes were symmetrical in all extremities and he had no pathological reflexes.  Sensory - Light touch, temperature/pinprick were assessed and were symmetrical.    Coordination - The patient had normal movements in the hands and feet with no ataxia or dysmetria although left FTN slow.  Tremor was absent.  Gait and Station - deferred.   ASSESSMENT/PLAN Mr. Daniel Osborne is a 76 y.o. male with history of HTN, CLL admitted for left facial droop and slurry speech. No tPA given due to OSW.    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 Norma Fredrickson, will consider cerebral angiogram with potential left ICA stenting given likely failed right MCA collateral flow 2D Echo  EF 60-65% LDL 158 HgbA1c 6.0 Heparin subq for VTE prophylaxis No antithrombotic prior to admission, now on aspirin 81 mg daily and Brilinta (ticagrelor) 90 mg bid after loading dose. Patient counseled to be compliant with his antithrombotic medications Ongoing aggressive stroke risk factor management Therapy recommendations:  pending Disposition:  pending  Hypertension Stable Permissive hypertension (OK if <220/120) for 24-48 hours post stroke and then gradually normalized within 5-7 days. Long term BP goal 130-150 given right ICA occlusion and left ICA high grade stenosis  Hyperlipidemia Home meds:  none  LDL 158, goal < 70 Now on lipitor 80 Continue statin at discharge  Other Stroke Risk Factors Advanced age  Other Active Problems CLL -  [facility]. Per pt, his baseline WBC is around 40, no specific treatment as long as it is stable. Currently 40.4.   Hospital day # 1    Rosalin Hawking, MD PhD Stroke Neurology 01/11/2022 10:34 AM    To contact Stroke Continuity provider, please refer to [email or web address] After hours, contact General  Neurology

## 2022-01-11 NOTE — Consult Note (Signed)
Chief Complaint: Patient was seen in consultation today for Cerebral arteriogram with possible left internal carotid artery angioplasty/stent placement Chief Complaint  Patient presents with   Facial Droop   at the request of Dr Lavera Guise   Supervising Physician: [name]  Patient Status: [facility] - In-pt  History of Present Illness: Daniel Osborne is a 76 y.o. male   Pt states he was well until Tuesday am when he noticed he couldn't speak well He though he just needed to clear his throat He drove to work and teammates told him he needed to go to MD asap.  Hx Chronic leukemia; HTN  In ED at [facility] slurred speech and drooling Denies headache or pain Denies N/V/D Endorses numbness at left mouth and face Left hand/arm weakness   Denies smoking or etoh  CTA: 1. 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 2. Moderate to severe bilateral paraclinoid ICA stenosis. 3. Multifocal severe left P1 and P2 PCA stenosis. Moderate right P1 PCA stenosis. 4. Severe right vertebral artery origin stenosis. Also, severe right and moderate left distal intradural vertebral artery stenosis. 5. Approximately 70-80% stenosis of the proximal left ICA in the neck. 6. Multifocal mild-to-moderate bilateral M1 and M2 MCA stenosis 7. Increased number of lymph nodes in the neck bilaterally and in the visualized upper mediastinum with enlarged and somewhat rounded submandibular and upper cervical chain nodes. While nonspecific, findings raise concern for underlying lymphoproliferative disorder. Consider follow-up CT neck and possibly CT chest/abdomen/pelvis for further evaluation.  Transferred from [facility] to [facility] for Neuro work up and management Dr Erlinda Hong has seen pt Request L ICA angioplasty/stent Dr Tennis Must Sindy Messing has reviewed imaging and approves procedure IR scheduler  arranging anesthesia asap Pt has eaten this am--- npo at 9 am today Dr Erlinda Hong ordering Brilinta 180 mg for load Pt on ASA 81      Past Medical History:  Diagnosis Date   Chronic Leukemia February 2014   Hypertension     Past Surgical History:  Procedure Laterality Date   TONSILLECTOMY AND ADENOIDECTOMY      Allergies: Patient has no known allergies.  Medications: Prior to Admission medications   Medication Sig Start Date End Date Taking? Authorizing Provider  acetaminophen (TYLENOL) 500 MG tablet Take 500 mg by mouth every 6 (six) hours as needed.   Yes [provider]  lisinopril (ZESTRIL) 10 MG tablet Take 5 mg by mouth daily.   Yes [provider]  amoxicillin-clavulanate (AUGMENTIN) 875-125 MG tablet TAKE 1 TABLET BY MOUTH 2 (TWO) TIMES DAILY. Patient not taking: Reported on 01/09/2022 12/07/15   Claretta Fraise, MD  fluticasone [facility]) 50 MCG/ACT nasal spray Place 2 sprays into both nostrils daily. Patient not taking: Reported on 01/09/2022 11/19/15   Sharion Balloon, FNP     Family History  Problem Relation Age of Onset   Stroke Mother    Cancer Father        leukemia   Cancer Sister        breast    Social History   Socioeconomic History   Marital status: Unknown    Spouse name: Not on file   Number of children: Not on file   Years of education: Not on file   Highest education level: Not on file  Occupational History   Not on file  Tobacco Use   Smoking status:  Never   Smokeless tobacco: Never  Substance and Sexual Activity   Alcohol use: No   Drug use: No   Sexual activity: Not on file  Other Topics Concern   Not on file  Social History Narrative   Not on file   Social Determinants of Health   Financial Resource Strain: Not on file  Food Insecurity: Not on file  Transportation Needs: Not on file  Physical Activity: Not on file  Stress: Not on file  Social Connections: Not on file    Review of Systems: A 12 point ROS  discussed and pertinent positives are indicated in the HPI above.  All other systems are negative.  Review of Systems  Constitutional:  Positive for activity change. Negative for appetite change, fatigue, fever and unexpected weight change.  HENT:  Positive for drooling. Negative for hearing loss, sore throat, tinnitus, trouble swallowing and voice change.   Eyes:  Negative for visual disturbance.  Respiratory:  Negative for cough and shortness of breath.   Cardiovascular:  Negative for chest pain.  Gastrointestinal:  Negative for abdominal pain, nausea and vomiting.  Musculoskeletal:  Negative for gait problem.  Neurological:  Positive for facial asymmetry, speech difficulty, weakness and numbness. Negative for dizziness, tremors, seizures, syncope, light-headedness and headaches.  Psychiatric/Behavioral:  Negative for behavioral problems and confusion.    Vital Signs: BP 134/81 (BP Location: Left Arm)    Pulse 88    Temp 97.8 F (36.6 C) (Oral)    Resp 17    Ht 5\' 9"  (1.753 m)    Wt 170 lb 3.1 oz (77.2 kg)    SpO2 92%    BMI 25.13 kg/m   Physical Exam Vitals reviewed.  Constitutional:      Comments: Left facial droop  HENT:     Mouth/Throat:     Mouth: Mucous membranes are moist.  Eyes:     Extraocular Movements: Extraocular movements intact.  Cardiovascular:     Rate and Rhythm: Normal rate and regular rhythm.     Heart sounds: Normal heart sounds.  Pulmonary:     Effort: Pulmonary effort is normal.     Breath sounds: Normal breath sounds.  Abdominal:     Palpations: Abdomen is soft.     Tenderness: There is no abdominal tenderness.  Musculoskeletal:        General: No tenderness. Normal range of motion.     Cervical back: Normal range of motion.     Right lower leg: No edema.     Left lower leg: No edema.     Comments: Left hand/arm weaker than right Walking in room without help Lower extremities = strength  Neurological:     Mental Status: He is alert and oriented to  person, place, and time.  Psychiatric:        Mood and Affect: Mood normal.        Behavior: Behavior normal.        Thought Content: Thought content normal.        Judgment: Judgment normal.    Imaging: CT ANGIO HEAD NECK W WO CM  Result Date: 01/09/2022 CLINICAL DATA:  Neuro deficit, acute, stroke suspected EXAM: CT ANGIOGRAPHY HEAD AND NECK TECHNIQUE: Multidetector CT imaging of the head and neck was performed using the standard protocol during bolus administration of intravenous contrast. Multiplanar CT image reconstructions and MIPs were obtained to evaluate the vascular anatomy. Carotid stenosis measurements (when applicable) are obtained utilizing NASCET criteria, using the distal internal carotid  diameter as the denominator. RADIATION DOSE REDUCTION: This exam was performed according to the departmental dose-optimization program which includes automated exposure control, adjustment of the mA and/or kV according to patient size and/or use of iterative reconstruction technique. CONTRAST:  2mL OMNIPAQUE IOHEXOL 350 MG/ML SOLN COMPARISON:  None. FINDINGS: CT HEAD FINDINGS Brain: Patchy hypoattenuation in the right frontal white matter and bilateral basal ganglia. No acute hemorrhage, mass lesion, midline shift, hydrocephalus or visible extra-axial fluid collection. Mild atrophy. Vascular: See below. Skull: No acute fracture. Sinuses: Small right maxillary sinus with mild mucosal thickening. Otherwise, clear sinuses. Orbits: No acute finding. Review of the MIP images confirms the above findings CTA NECK FINDINGS Aortic arch: Great vessel origins are patent. Right carotid system: Mixed calcific and noncalcific atherosclerosis at the carotid bifurcation. 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 The ICA remains non-opacified in the neck. Left carotid system: Mixed calcific and noncalcific atherosclerosis at the carotid bifurcation and involving the proximal ICA. Approximately 70-80% stenosis of the proximal  ICA. Vertebral arteries: Co dominant. Severe right vertebral artery origin stenosis. Otherwise, vertebral arteries are patent without significant stenosis. Skeleton: No evidence of acute abnormality on limited assessment. Other neck: Increased number of lymph nodes in the neck bilaterally and in the visualized upper mediastinum with enlarged bilateral submandibular and upper cervical chain nodes. Left level 2 node measures up to 1.5 cm short axis and is rounded. Submandibular nodes measure up to approximately 1.3 cm short axis on the right. Upper chest: Visualized lung apices are clear. Review of the MIP images confirms the above findings CTA HEAD FINDINGS Anterior circulation: Reconstitution of the right ICA at the proximal petrous segment. Bilateral intracranial ICA atherosclerosis with moderate to severe bilateral paraclinoid ICA stenosis. Bilateral MCAs are patent with multifocal mild-to-moderate M1 and M2 MCA stenosis. Small right A1 ACA, probably congenital given prominent left A1 ACA. Otherwise, ACAs are patent without proximal high-grade stenosis Posterior circulation: Bilateral intradural vertebral arteries are patent. Severe stenosis of the distal right intradural vertebral artery. Moderate stenosis of the distal left intradural vertebral artery. Basilar artery is patent with multifocal mild stenosis. Multifocal severe left P1 and P2 PCA stenosis. Moderate right P1 PCA stenosis. Venous sinuses: As permitted by contrast timing, patent. Review of the MIP images confirms the above findings IMPRESSION: CT head: 1. Patchy hypoattenuation in the right frontal white matter and bilateral basal ganglia, which could represent chronic microvascular ischemic disease versus age indeterminate infarcts in the absence of priors. MRI could provide more sensitive evaluation for acute infarct. 2. No evidence of acute hemorrhage. CTA: 1. 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 2. Moderate to severe bilateral paraclinoid ICA stenosis. 3. Multifocal severe left P1 and P2 PCA stenosis. Moderate right P1 PCA stenosis. 4. Severe right vertebral artery origin stenosis. Also, severe right and moderate left distal intradural vertebral artery stenosis. 5. Approximately 70-80% stenosis of the proximal left ICA in the neck. 6. Multifocal mild-to-moderate bilateral M1 and M2 MCA stenosis 7. Increased number of lymph nodes in the neck bilaterally and in the visualized upper mediastinum with enlarged and somewhat rounded submandibular and upper cervical chain nodes. While nonspecific, findings raise concern for underlying lymphoproliferative disorder. Consider follow-up CT neck and possibly CT chest/abdomen/pelvis for further evaluation. Findings discussed with Ileene Patrick PA via telephone at 7:16 PM Electronically Signed   By: Margaretha Sheffield M.D.   On: 01/09/2022 19:19  MR BRAIN WO CONTRAST  Result Date: 01/10/2022 CLINICAL DATA:  Neuro deficit, acute, stroke suspected EXAM: MRI HEAD WITHOUT CONTRAST TECHNIQUE: Multiplanar, multiecho pulse sequences of the brain and surrounding structures were obtained without intravenous contrast. COMPARISON:  None. FINDINGS: Brain: There is cortical/subcortical reduced diffusion in the right frontal lobe including involvement of lateral precentral gyrus. Patchy and confluent areas of T2 hyperintensity in the supratentorial white matter nonspecific but probably reflect mild to moderate chronic microvascular ischemic changes. There are chronic right frontoparietal cortical infarcts. Small chronic infarct of the right frontal subcortical white matter. Prominent perivascular spaces and probable superimposed chronic small vessel infarcts of the central gray nuclei and white matter. Chronic blood products along the right basal ganglia and adjacent white matter. No intracranial mass or mass effect. There  is no hydrocephalus or extra-axial fluid collection. Prominence of the ventricles and sulci reflects mild parenchymal volume loss. Vascular: Diminished right ICA flow void. Skull and upper cervical spine: Marrow signal is mildly heterogeneous but otherwise unremarkable. Sinuses/Orbits: Mild mucosal thickening.  Orbits are unremarkable. Other: Sella is unremarkable.  Mastoid air cells are clear. IMPRESSION: Small acute cortical/subcortical right frontal infarcts with involvement of lateral precentral gyrus. 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 Electronically Signed   By: Macy Mis M.D.   On: 01/10/2022 09:41   ECHOCARDIOGRAM COMPLETE  Result Date: 01/10/2022    ECHOCARDIOGRAM REPORT   Patient Name:   RAMELLO CORDIAL Date of Exam: 01/10/2022 Medical Rec #:  [id number]       Height:       69.0 in Accession #:    [id number]      Weight:       170.2 lb Date of Birth:  [birth year]       BSA:          1.929 m Patient Age:    80 years        BP:           141/84 mmHg Patient Gender: M               HR:           81 bpm. Exam Location:  [facility] Procedure: 2D Echo, Cardiac Doppler and Color Doppler Indications:    Stroke I63.9  History:        Patient has no prior history of Echocardiogram examinations.                 Risk Factors:Hypertension. CVA (cerebral vascular accident)                 ([place]).  Sonographer:    Alvino Chapel RCS Referring Phys: [id number] ASIA B [place]  1. Left ventricular ejection fraction, by estimation, is 60 to 65%. The left ventricle has normal function. The left ventricle has no regional wall motion abnormalities. Left ventricular diastolic parameters are consistent with Grade I diastolic dysfunction (impaired relaxation).  2. Right ventricular systolic function is normal. The right ventricular size is normal. Tricuspid regurgitation signal is inadequate for assessing PA pressure.  3. The mitral valve is degenerative. Trivial mitral valve  regurgitation. No evidence of mitral stenosis.  4. The aortic valve is tricuspid. Aortic valve regurgitation is trivial. Aortic valve sclerosis is present, with no evidence of aortic valve stenosis.  5. The inferior vena cava is normal in size with greater than 50% respiratory variability, suggesting right atrial pressure of 3 mmHg. Comparison(s): No prior Echocardiogram. FINDINGS  Left Ventricle: Left ventricular ejection fraction,  by estimation, is 60 to 65%. The left ventricle has normal function. The left ventricle has no regional wall motion abnormalities. The left ventricular internal cavity size was normal in size. There is  no left ventricular hypertrophy. Left ventricular diastolic parameters are consistent with Grade I diastolic dysfunction (impaired relaxation). Right Ventricle: The right ventricular size is normal. No increase in right ventricular wall thickness. Right ventricular systolic function is normal. Tricuspid regurgitation signal is inadequate for assessing PA pressure. Left Atrium: Left atrial size was normal in size. Right Atrium: Right atrial size was normal in size. Pericardium: There is no evidence of pericardial effusion. Mitral Valve: The mitral valve is degenerative in appearance. Trivial mitral valve regurgitation. No evidence of mitral valve stenosis. Tricuspid Valve: The tricuspid valve is normal in structure. Tricuspid valve regurgitation is trivial. No evidence of tricuspid stenosis. Aortic Valve: The aortic valve is tricuspid. Aortic valve regurgitation is trivial. Aortic valve sclerosis is present, with no evidence of aortic valve stenosis. Aortic valve mean gradient measures 12.0 mmHg. Aortic valve peak gradient measures 20.6 mmHg. Aortic valve area, by VTI measures 1.42 cm. Pulmonic Valve: The pulmonic valve was not well visualized. Pulmonic valve regurgitation is not visualized. No evidence of pulmonic stenosis. Aorta: The aortic root is normal in size and structure. Venous:  The inferior vena cava is normal in size with greater than 50% respiratory variability, suggesting right atrial pressure of 3 mmHg. IAS/Shunts: No atrial level shunt detected by color flow Doppler.  LEFT VENTRICLE PLAX 2D LVIDd:         4.20 cm   Diastology LVIDs:         2.80 cm   LV e' medial:    7.94 cm/s LV PW:         1.00 cm   LV E/e' medial:  11.6 LV IVS:        0.90 cm   LV e' lateral:   5.98 cm/s LVOT diam:     1.90 cm   LV E/e' lateral: 15.5 LV SV:         67 LV SV Index:   35 LVOT Area:     2.84 cm  RIGHT VENTRICLE RV S prime:     12.10 cm/s TAPSE (M-mode): 2.1 cm LEFT ATRIUM             Index        RIGHT ATRIUM           Index LA diam:        3.60 cm 1.87 cm/m   RA Area:     15.70 cm LA Vol (A2C):   62.3 ml 32.30 ml/m  RA Volume:   40.20 ml  20.84 ml/m LA Vol (A4C):   45.2 ml 23.43 ml/m LA Biplane Vol: 55.0 ml 28.51 ml/m  AORTIC VALVE AV Area (Vmax):    1.44 cm AV Area (Vmean):   1.28 cm AV Area (VTI):     1.42 cm AV Vmax:           227.00 cm/s AV Vmean:          160.000 cm/s AV VTI:            0.470 m AV Peak Grad:      20.6 mmHg AV Mean Grad:      12.0 mmHg LVOT Vmax:         115.00 cm/s LVOT Vmean:        72.000 cm/s LVOT VTI:  0.235 m LVOT/AV VTI ratio: 0.50  AORTA Ao Root diam: 3.10 cm MITRAL VALVE MV Area (PHT): 3.37 cm     SHUNTS MV Decel Time: 225 msec     Systemic VTI:  0.24 m MV E velocity: 92.50 cm/s   Systemic Diam: 1.90 cm MV A velocity: 111.00 cm/s MV E/A ratio:  0.83 Rudean Haskell MD Electronically signed by Rudean Haskell MD Signature Date/Time: 01/10/2022/12:08:44 PM    Final     Labs:  CBC: Recent Labs    01/09/22 1620 01/10/22 0410  WBC 40.0* 40.4*  HGB 13.0 12.7*  HCT 41.5 41.7  PLT 182 150    COAGS: Recent Labs    01/09/22 1620  INR 1.0    BMP: Recent Labs    01/09/22 1620 01/10/22 0410  NA 140 141  K 3.4* 4.0  CL 104 108  CO2 29 26  GLUCOSE 88 90  BUN 22 20  CALCIUM 9.4 9.0  CREATININE 1.28* 1.17  GFRNONAA 59* >60     LIVER FUNCTION TESTS: Recent Labs    01/10/22 0410  BILITOT 0.4  AST 17  ALT 13  ALKPHOS 60  PROT 6.5  ALBUMIN 3.5    TUMOR MARKERS: No results for input(s): AFPTM, CEA, CA199, CHROMGRNA in the last 8760 hours.  Assessment and Plan:  Left internal carotid artery stenosis Scheduled for cerebral arteriogram with possible angioplasty/stenting in IR today IR scheduler working on anesthesia and time slot Risks and benefits of cerebral angiogram with intervention were discussed with the patient including, but not limited to bleeding, infection, vascular injury, contrast induced renal failure, stroke or even death.  This interventional procedure involves the use of X-rays and because of the nature of the planned procedure, it is possible that we will have prolonged use of X-ray fluoroscopy.  Potential radiation risks to you include (but are not limited to) the following: - A slightly elevated risk for cancer  several years later in life. This risk is typically less than 0.5% percent. This risk is low in comparison to the normal incidence of human cancer, which is 33% for women and 50% for men according to the [place]. - Radiation induced injury can include skin redness, resembling a rash, tissue breakdown / ulcers and hair loss (which can be temporary or permanent).   The likelihood of either of these occurring depends on the difficulty of the procedure and whether you are sensitive to radiation due to previous procedures, disease, or genetic conditions.   IF your procedure requires a prolonged use of radiation, you will be notified and given written instructions for further action.  It is your responsibility to monitor the irradiated area for the 2 weeks following the procedure and to notify your physician if you are concerned that you have suffered a radiation induced injury.    All of the patient's questions were answered, patient is agreeable to proceed. Consent  signed and in chart.   Thank you for this interesting consult.  I greatly enjoyed meeting MATAS BURROWS and look forward to participating in their care.  A copy of this report was sent to the requesting provider on this date.  Electronically Signed: Lavonia Drafts, PA-C 01/11/2022, 10:01 AM   I spent a total of 40 Minutes    in face to face in clinical consultation, greater than 50% of which was counseling/coordinating care for LICA angioplasty/stent

## 2022-01-11 NOTE — Evaluation (Signed)
Occupational Therapy Evaluation Patient Details Name: Daniel Osborne MRN: [id number] DOB: 01-Oct-1946 Today's Date: 01/11/2022   History of Present Illness 76 y.o. male who presented with left side facial droop. MRI Brain shows Small acute cortical/subcortical right frontal infarcts with involvement of lateral precentral gyrus. 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 PMHx: chronic leukemia and hypertension, and dyslipidemia   Clinical Impression   Daniel Osborne was evaluated s/p the above CVA. He is indep at baseline including working part-time and driving. He lives in a 1 level home with his wife who can assist as needed at d/c. Upon evaluation pt was supervision level for all functional mobility without AD and ADLs. He required increased time and effort for bimanual fine motor tasks due to impaired strength and coordination of L hand. Pt will benefit from continued OT acutely to address the limitations listed below. Recommend d/c home with OP neuro OT to address LUE impairments.      Recommendations for follow up therapy are one component of a multi-disciplinary discharge planning process, led by the attending physician.  Recommendations may be updated based on patient status, additional functional criteria and insurance authorization.   Follow Up Recommendations  Outpatient OT (neruo OPOT)    Assistance Recommended at Discharge PRN  Patient can return home with the following Assist for transportation;Assistance with cooking/housework    Functional Status Assessment  Patient has had a recent decline in their functional status and demonstrates the ability to make significant improvements in function in a reasonable and predictable amount of time.  Equipment Recommendations  None recommended by OT       Precautions / Restrictions Precautions Precautions: Fall Precaution Comments: low fall Restrictions Weight Bearing Restrictions: No      Mobility Bed  Mobility Overal bed mobility: Independent     Transfers Overall transfer level: Modified independent        Balance Overall balance assessment: No apparent balance deficits (not formally assessed)       ADL either performed or assessed with clinical judgement   ADL Overall ADL's : Needs assistance/impaired Eating/Feeding: Supervision/ safety;Sitting Eating/Feeding Details (indicate cue type and reason): pt states that food "gets stuck" in the L side of his mouth when eating Grooming: Set up;Sitting Grooming Details (indicate cue type and reason): increased time/effort for packages Upper Body Bathing: Sitting;Set up   Lower Body Bathing: Set up;Sit to/from stand;Supervison/ safety   Upper Body Dressing : Set up;Sitting   Lower Body Dressing: Supervision/safety;Sit to/from stand   Toilet Transfer: Supervision/safety;Ambulation Toilet Transfer Details (indicate cue type and reason): no AD Toileting- Clothing Manipulation and Hygiene: Modified independent;Sitting/lateral lean       Functional mobility during ADLs: Supervision/safety General ADL Comments: limited by impaire L hand coordination     Vision Baseline Vision/History: 0 No visual deficits;1 Wears glasses Ability to See in Adequate Light: 0 Adequate Patient Visual Report: No change from baseline Vision Assessment?: No apparent visual deficits            Pertinent Vitals/Pain Pain Assessment Pain Assessment: No/denies pain     Hand Dominance Right   Extremity/Trunk Assessment Upper Extremity Assessment Upper Extremity Assessment: LUE deficits/detail LUE Deficits / Details: generally weaker than R. poor coordination. ROM is WFL. sensation is WNL. LUE Sensation: WNL LUE Coordination: decreased fine motor;decreased gross motor   Lower Extremity Assessment Lower Extremity Assessment: Defer to PT evaluation   Cervical / Trunk Assessment Cervical / Trunk Assessment: Normal  Communication  Communication Communication: Expressive difficulties (slurred speech)   Cognition Arousal/Alertness: Awake/alert Behavior During Therapy: WFL for tasks assessed/performed Overall Cognitive Status: Within Functional Limits for tasks assessed         General Comments: good insight     General Comments  VSS on RA, no family present     Daniel Osborne expects to be discharged to:: Private residence Living Arrangements: Spouse/significant other Available Help at Discharge: Available 24 hours/day Type of Home: House Home Access: Stairs to enter [employer] of Steps: 2-3   Home Layout: One level     Bathroom Shower/Tub: Teacher, early years/pre: [place]: Conservation officer, nature (2 wheels);Cane - single point          Prior Functioning/Environment Prior Level of Function : Independent/Modified Independent             Mobility Comments: no AD ADLs Comments: drives, works part time        OT Problem List: Decreased strength;Decreased range of motion;Decreased coordination      OT Treatment/Interventions: Self-care/ADL training;Therapeutic exercise;Therapeutic activities;Patient/family education    OT Goals(Current goals can be found in the care plan section) Acute Rehab OT Goals Patient Stated Goal: home OT Goal Formulation: With patient Time For Goal Achievement: 01/25/22 Potential to Achieve Goals: Good ADL Goals Pt/caregiver will Perform Home Exercise Program: Left upper extremity;With written HEP provided;With theraputty Additional ADL Goal #2: Pt will independently complete all ADLs  OT Frequency: Min 2X/week       AM-PAC OT "6 Clicks" Daily Activity     Outcome Measure Help from another person eating meals?: A Little Help from another person taking care of personal grooming?: A Little Help from another person toileting, which includes using toliet, bedpan, or urinal?: None Help from another person bathing  (including washing, rinsing, drying)?: None Help from another person to put on and taking off regular upper body clothing?: None Help from another person to put on and taking off regular lower body clothing?: None 6 Click Score: 22   End of Session Nurse Communication: Mobility status  Activity Tolerance: Patient tolerated treatment well Patient left: in bed;with call bell/phone within reach  OT Visit Diagnosis: Hemiplegia and hemiparesis Hemiplegia - Right/Left: Left Hemiplegia - dominant/non-dominant: Non-Dominant Hemiplegia - caused by: Cerebral infarction                Time: 5053-9767 OT Time Calculation (min): 18 min Charges:  OT General Charges $OT Visit: 1 Visit OT Evaluation $OT Eval Low Complexity: 1 Low  Murna Backer A Sherrita Riederer 01/11/2022, 10:09 AM

## 2022-01-11 NOTE — Progress Notes (Signed)
Speech Language Pathology Treatment: Cognitive-Linquistic (Dysarthria)  Patient Details Name: Daniel Osborne MRN: [id number] DOB: 1946/10/19 Today's Date: 01/11/2022 Time: 1207-1223 SLP Time Calculation (min) (ACUTE ONLY): 16 min  Assessment / Plan / Recommendation Clinical Impression  Pt was seen for dysarthria treatment and was cooperative during the session. Pt and his family (i.e., sister and brother-in-law) were educated regarding the nature of dysarthria, and compensatory strategies to improve speech intelligibility. All parties verbalized understanding regarding all areas of education, and all of their questions were answered. He used compensatory strategies at the phrase level with 60% accuracy increasing to 100% accuracy with verbal prompts for overarticulation, and vocal intensity. At the sentence level he demonstrated 50% accuracy increasing to 100% accuracy with cues for rate and overarticulation (especially with mutisyllabic words). Despite the level of support necessary during during structured tasks, some generalization of vocal intensity was noted at the conversational level. SLP will continue to follow pt.    HPI HPI: Pt is a 76 y.o. male who presented with left side facial droop. MRI Brain 2/7: Small acute cortical/subcortical right frontal infarcts with involvement of lateral precentral gyrus. 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 PMH: chronic leukemia and hypertension, and dyslipidemia      SLP Plan  Continue with current plan of care  Patient needs continued Speech Lanaguage Pathology Services   Recommendations for follow up therapy are one component of a multi-disciplinary discharge planning process, led by the attending physician.  Recommendations may be updated based on patient status, additional functional criteria and insurance authorization.    Recommendations                   Follow Up Recommendations: Outpatient  SLP Assistance recommended at discharge: Intermittent Supervision/Assistance SLP Visit Diagnosis: Dysarthria and anarthria (R47.1);Cognitive communication deficit (R41.841) Plan: Continue with current plan of care         Emrie Gayle I. Hardin Negus, [place], [place] Office number [phone] Pager [place]  01/11/2022, 1:22 PM

## 2022-01-12 ENCOUNTER — Encounter (HOSPITAL_COMMUNITY)
Admission: EM | Disposition: A | Discharge status: Home / Self Care | Payer: Self-pay | Source: Home / Self Care | Attending: Internal Medicine

## 2022-01-12 ENCOUNTER — Inpatient Hospital Stay (HOSPITAL_COMMUNITY): Payer: Medicare Other

## 2022-01-12 ENCOUNTER — Inpatient Hospital Stay (HOSPITAL_COMMUNITY): Payer: Medicare Other | Admitting: Anesthesiology

## 2022-01-12 ENCOUNTER — Encounter (HOSPITAL_COMMUNITY): Payer: Self-pay | Admitting: Family Medicine

## 2022-01-12 DIAGNOSIS — I10 Essential (primary) hypertension: Secondary | ICD-10-CM

## 2022-01-12 DIAGNOSIS — D649 Anemia, unspecified: Secondary | ICD-10-CM

## 2022-01-12 DIAGNOSIS — I63233 Cerebral infarction due to unspecified occlusion or stenosis of bilateral carotid arteries: Secondary | ICD-10-CM

## 2022-01-12 HISTORY — PX: IR INTRAVSC STENT CERV CAROTID W/EMB-PROT MOD SED INCL ANGIO: IMG2303

## 2022-01-12 HISTORY — PX: IR ANGIO EXTRACRAN SEL COM CAROTID INNOMINATE UNI BILAT MOD SED: IMG5357

## 2022-01-12 HISTORY — PX: RADIOLOGY WITH ANESTHESIA: SHX6223

## 2022-01-12 HISTORY — PX: IR US GUIDE VASC ACCESS RIGHT: IMG2390

## 2022-01-12 HISTORY — PX: IR ANGIO VERTEBRAL SEL VERTEBRAL UNI L MOD SED: IMG5367

## 2022-01-12 LAB — CBC
HCT: 39.9 % (ref 39.0–52.0)
Hemoglobin: 12.8 g/dL — ABNORMAL LOW (ref 13.0–17.0)
MCH: 29.8 pg (ref 26.0–34.0)
MCHC: 32.1 g/dL (ref 30.0–36.0)
MCV: 93 fL (ref 80.0–100.0)
Platelets: 168 10*3/uL (ref 150–400)
RBC: 4.29 MIL/uL (ref 4.22–5.81)
RDW: 14.7 % (ref 11.5–15.5)
WBC: 39.6 10*3/uL — ABNORMAL HIGH (ref 4.0–10.5)
nRBC: 0 % (ref 0.0–0.2)

## 2022-01-12 LAB — RAPID URINE DRUG SCREEN, HOSP PERFORMED
Amphetamines: NOT DETECTED
Barbiturates: NOT DETECTED
Benzodiazepines: NOT DETECTED
Cocaine: NOT DETECTED
Opiates: NOT DETECTED
Tetrahydrocannabinol: NOT DETECTED

## 2022-01-12 LAB — BASIC METABOLIC PANEL
Anion gap: 10 (ref 5–15)
BUN: 18 mg/dL (ref 8–23)
CO2: 22 mmol/L (ref 22–32)
Calcium: 9.5 mg/dL (ref 8.9–10.3)
Chloride: 106 mmol/L (ref 98–111)
Creatinine, Ser: 1.28 mg/dL — ABNORMAL HIGH (ref 0.61–1.24)
GFR, Estimated: 59 mL/min — ABNORMAL LOW (ref >60)
Glucose, Bld: 93 mg/dL (ref 70–99)
Potassium: 4.2 mmol/L (ref 3.5–5.1)
Sodium: 138 mmol/L (ref 135–145)

## 2022-01-12 LAB — SURGICAL PCR SCREEN
MRSA, PCR: NEGATIVE
Staphylococcus aureus: POSITIVE — AB

## 2022-01-12 SURGERY — IR WITH ANESTHESIA
Anesthesia: Monitor Anesthesia Care

## 2022-01-12 MED ORDER — PROPOFOL 10 MG/ML IV BOLUS
INTRAVENOUS | Status: AC
  Filled 2022-01-12: qty 20

## 2022-01-12 MED ORDER — CHLORHEXIDINE GLUCONATE CLOTH 2 % EX PADS
6.0000 | MEDICATED_PAD | Freq: Every day | CUTANEOUS | Status: DC
  Administered 2022-01-12: 6 via TOPICAL

## 2022-01-12 MED ORDER — ACETAMINOPHEN 650 MG RE SUPP: 650.0000 mg | RECTAL | Status: DC | PRN

## 2022-01-12 MED ORDER — ACETAMINOPHEN 160 MG/5ML PO SOLN: 650.0000 mg | ORAL | Status: DC | PRN

## 2022-01-12 MED ORDER — ORAL CARE MOUTH RINSE: 15.0000 mL | Freq: Once | OROMUCOSAL | Status: AC

## 2022-01-12 MED ORDER — GLYCOPYRROLATE PF 0.2 MG/ML IJ SOSY
PREFILLED_SYRINGE | INTRAMUSCULAR | Status: DC | PRN
  Administered 2022-01-12 (×2): .1 mg via INTRAVENOUS

## 2022-01-12 MED ORDER — CHLORHEXIDINE GLUCONATE 0.12 % MT SOLN: 15.0000 mL | Freq: Once | OROMUCOSAL | Status: AC

## 2022-01-12 MED ORDER — ACETAMINOPHEN 325 MG PO TABS
650.0000 mg | ORAL_TABLET | ORAL | Status: DC | PRN
  Administered 2022-01-12 – 2022-01-13 (×2): 650 mg via ORAL
  Filled 2022-01-12 (×2): qty 2

## 2022-01-12 MED ORDER — ASPIRIN 81 MG PO CHEW: 81.0000 mg | CHEWABLE_TABLET | Freq: Every day | ORAL | Status: DC

## 2022-01-12 MED ORDER — EPHEDRINE SULFATE-NACL 50-0.9 MG/10ML-% IV SOSY
PREFILLED_SYRINGE | INTRAVENOUS | Status: DC | PRN
  Administered 2022-01-12 (×2): 2.5 mg via INTRAVENOUS
  Administered 2022-01-12: 5 mg via INTRAVENOUS

## 2022-01-12 MED ORDER — OXYCODONE HCL 5 MG PO TABS: 5.0000 mg | ORAL_TABLET | Freq: Once | ORAL | Status: DC | PRN

## 2022-01-12 MED ORDER — FENTANYL CITRATE (PF) 100 MCG/2ML IJ SOLN
INTRAMUSCULAR | Status: DC | PRN
Start: 2022-01-12 — End: 2022-01-12
  Administered 2022-01-12 (×2): 25 ug via INTRAVENOUS

## 2022-01-12 MED ORDER — IOHEXOL 300 MG/ML  SOLN
100.0000 mL | Freq: Once | INTRAMUSCULAR | Status: AC | PRN
  Administered 2022-01-12: 75 mL via INTRA_ARTERIAL

## 2022-01-12 MED ORDER — ACETAMINOPHEN 10 MG/ML IV SOLN: 1000.0000 mg | Freq: Once | INTRAVENOUS | Status: DC | PRN

## 2022-01-12 MED ORDER — ACETAMINOPHEN 500 MG PO TABS: 1000.0000 mg | ORAL_TABLET | Freq: Once | ORAL | Status: DC | PRN

## 2022-01-12 MED ORDER — SODIUM CHLORIDE 0.9 % IV SOLN: INTRAVENOUS | Status: DC

## 2022-01-12 MED ORDER — FENTANYL CITRATE (PF) 250 MCG/5ML IJ SOLN
INTRAMUSCULAR | Status: AC
  Filled 2022-01-12: qty 5

## 2022-01-12 MED ORDER — CLEVIDIPINE BUTYRATE 0.5 MG/ML IV EMUL: 0.0000 mg/h | INTRAVENOUS | Status: DC

## 2022-01-12 MED ORDER — ACETAMINOPHEN 160 MG/5ML PO SOLN: 1000.0000 mg | Freq: Once | ORAL | Status: DC | PRN

## 2022-01-12 MED ORDER — FENTANYL CITRATE (PF) 100 MCG/2ML IJ SOLN: 25.0000 ug | INTRAMUSCULAR | Status: DC | PRN

## 2022-01-12 MED ORDER — PHENYLEPHRINE HCL-NACL 20-0.9 MG/250ML-% IV SOLN: 0.0000 ug/min | INTRAVENOUS | Status: DC

## 2022-01-12 MED ORDER — PHENYLEPHRINE HCL-NACL 20-0.9 MG/250ML-% IV SOLN
INTRAVENOUS | Status: DC | PRN
  Administered 2022-01-12: 20 ug/min via INTRAVENOUS

## 2022-01-12 MED ORDER — MIDAZOLAM HCL 5 MG/5ML IJ SOLN
INTRAMUSCULAR | Status: DC | PRN
  Administered 2022-01-12: 1 mg via INTRAVENOUS
  Administered 2022-01-12: .5 mg via INTRAVENOUS

## 2022-01-12 MED ORDER — ATROPINE SULFATE 0.4 MG/ML IV SOLN
INTRAVENOUS | Status: DC | PRN
  Administered 2022-01-12 (×2): .1 mg via INTRAVENOUS

## 2022-01-12 MED ORDER — ASPIRIN 81 MG PO CHEW
81.0000 mg | CHEWABLE_TABLET | Freq: Every day | ORAL | Status: DC
  Administered 2022-01-13: 81 mg via ORAL
  Filled 2022-01-12: qty 1

## 2022-01-12 MED ORDER — CHLORHEXIDINE GLUCONATE 0.12 % MT SOLN
OROMUCOSAL | Status: AC
  Filled 2022-01-12: qty 15

## 2022-01-12 MED ORDER — MUPIROCIN 2 % EX OINT
1.0000 "application " | TOPICAL_OINTMENT | Freq: Two times a day (BID) | CUTANEOUS | Status: DC
  Administered 2022-01-12 – 2022-01-13 (×2): 1 via NASAL
  Filled 2022-01-12: qty 22

## 2022-01-12 MED ORDER — MIDAZOLAM HCL 2 MG/2ML IJ SOLN
INTRAMUSCULAR | Status: AC
  Filled 2022-01-12: qty 2

## 2022-01-12 MED ORDER — HEPARIN SODIUM (PORCINE) 1000 UNIT/ML IJ SOLN
INTRAMUSCULAR | Status: DC | PRN
  Administered 2022-01-12: 5000 [IU] via INTRAVENOUS

## 2022-01-12 MED ORDER — CHLORHEXIDINE GLUCONATE 0.12 % MT SOLN
OROMUCOSAL | Status: AC
  Administered 2022-01-12: 15 mL via OROMUCOSAL
  Filled 2022-01-12: qty 15

## 2022-01-12 MED ORDER — LIDOCAINE HCL 1 % IJ SOLN
INTRAMUSCULAR | Status: AC | PRN
  Administered 2022-01-12: 20 mL via INTRADERMAL

## 2022-01-12 MED ORDER — TICAGRELOR 90 MG PO TABS
90.0000 mg | ORAL_TABLET | Freq: Two times a day (BID) | ORAL | Status: DC
  Administered 2022-01-12 – 2022-01-13 (×2): 90 mg via ORAL
  Filled 2022-01-12 (×2): qty 1

## 2022-01-12 MED ORDER — ONDANSETRON HCL 4 MG/2ML IJ SOLN: 4.0000 mg | Freq: Four times a day (QID) | INTRAMUSCULAR | Status: DC | PRN

## 2022-01-12 MED ORDER — LIDOCAINE HCL 1 % IJ SOLN
INTRAMUSCULAR | Status: AC
  Filled 2022-01-12: qty 20

## 2022-01-12 MED ORDER — OXYCODONE HCL 5 MG/5ML PO SOLN: 5.0000 mg | Freq: Once | ORAL | Status: DC | PRN

## 2022-01-12 MED ORDER — TICAGRELOR 90 MG PO TABS: 90.0000 mg | ORAL_TABLET | Freq: Two times a day (BID) | ORAL | Status: DC

## 2022-01-12 NOTE — Anesthesia Preprocedure Evaluation (Signed)
Anesthesia Evaluation  Patient identified by MRN, date of birth, ID band Patient awake    Reviewed: Allergy & Precautions, NPO status , Patient's Chart, lab work & pertinent test results  History of Anesthesia Complications Negative for: history of anesthetic complications  Airway Mallampati: III  TM Distance: >3 FB Neck ROM: Full   Comment: Left face droop Dental  (+) Dental Advisory Given, Teeth Intact   Pulmonary neg pulmonary ROS,    breath sounds clear to auscultation       Cardiovascular hypertension, Pt. on medications  Rhythm:Regular     Neuro/Psych neg Seizures CVA, Residual Symptoms negative psych ROS   GI/Hepatic negative GI ROS, Neg liver ROS,   Endo/Other  negative endocrine ROS  Renal/GU Lab Results      Component                Value               Date                      CREATININE               1.28 (H)            01/12/2022           Lab Results      Component                Value               Date                      K                        4.2                 01/12/2022                Musculoskeletal negative musculoskeletal ROS (+)   Abdominal   Peds  Hematology  (+) Blood dyscrasia, anemia , Lab Results      Component                Value               Date                      WBC                      39.6 (H)            01/12/2022                HGB                      12.8 (L)            01/12/2022                HCT                      39.9                01/12/2022                MCV  93.0                01/12/2022                PLT                      168                 01/12/2022              Anesthesia Other Findings 1. Left ventricular ejection fraction, by estimation, is 60 to 65%. The  left ventricle has normal function. The left ventricle has no regional  wall motion abnormalities. Left ventricular diastolic parameters are  consistent with Grade I  diastolic  dysfunction (impaired relaxation).  2. Right ventricular systolic function is normal. The right ventricular  size is normal. Tricuspid regurgitation signal is inadequate for assessing  PA pressure.  3. The mitral valve is degenerative. Trivial mitral valve regurgitation.  No evidence of mitral stenosis.  4. The aortic valve is tricuspid. Aortic valve regurgitation is trivial.  Aortic valve sclerosis is present, with no evidence of aortic valve  stenosis.  5. The inferior vena cava is normal in size with greater than 50%  respiratory variability, suggesting right atrial pressure of 3 mmHg.   Reproductive/Obstetrics                             Anesthesia Physical Anesthesia Plan  ASA: 3  Anesthesia Plan: MAC   Post-op Pain Management:    Induction: Intravenous  PONV Risk Score and Plan: 1 and Propofol infusion and Treatment may vary due to age or medical condition  Airway Management Planned: Nasal Cannula  Additional Equipment:   Intra-op Plan:   Post-operative Plan:   Informed Consent: I have reviewed the patients History and Physical, chart, labs and discussed the procedure including the risks, benefits and alternatives for the proposed anesthesia with the patient or authorized representative who has indicated his/her understanding and acceptance.     Dental advisory given  Plan Discussed with: Anesthesiologist and CRNA  Anesthesia Plan Comments:         Anesthesia Quick Evaluation

## 2022-01-12 NOTE — Transfer of Care (Signed)
Immediate Anesthesia Transfer of Care Note  Patient: Daniel Osborne  Procedure(s) Performed: IR WITH ANESTHESIA  Patient Location: PACU  Anesthesia Type:MAC  Level of Consciousness: awake and alert   Airway & Oxygen Therapy: Patient Spontanous Breathing and Patient connected to nasal cannula oxygen  Post-op Assessment: Report given to RN and Post -op Vital signs reviewed and stable  Post vital signs: Reviewed and stable  Last Vitals:  Vitals Value Taken Time  BP 141/81 01/12/22 1426  Temp    Pulse 88 01/12/22 1427  Resp 21 01/12/22 1427  SpO2 98 % 01/12/22 1427  Vitals shown include unvalidated device data.  Last Pain:  Vitals:   01/12/22 1027  TempSrc:   PainSc: 0-No pain      Patients Stated Pain Goal: 0 (44/46/19 0122)  Complications: No notable events documented.

## 2022-01-12 NOTE — Progress Notes (Signed)
NPO since midnight NS infusing at 50 SCDs on Urinated at 0645 BM 2/8 No s/s of distress Consent for stenting in chart

## 2022-01-12 NOTE — Progress Notes (Signed)
PROGRESS NOTE  Daniel Osborne  FYB:[id number] DOB: May 11, 1946 DOA: 01/09/2022 PCP: [facility]   Brief Narrative:  76 y.o. male with medical history significant of with history of chronic leukemia and hypertension, and dyslipidemia admitted on 01/09/2022 with at [facility] hospital with complaint of left facial droop, left-sided weakness, slurred speech.  MRI of the brain showed a small acute cortical/subcortical right frontal infarcts with involvement of lateral precentral gyrus.  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  He was transferred to [facility] for further work-up.  Neurology following.  Neurology consulted IR today for consideration of cerebral angiogram and potential left ICA stent.  Undergoing ICA stent placement today.  PT/OT/speech following with no follow-up recommendation   Assessment & Plan:  Assessment and Plan: * CVA (cerebral vascular accident) ([place])- (present on admission) - Acute CVA--POA -MRI Brain shows Small acute cortical/subcortical right frontal infarcts with involvement of lateral precentral gyrus.  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  CTA shows multiple intracranial vascular pathologies and carotid pathology as well  -Speech disturbance mostly resolved, patient has residual mild  left facial droop -Echo with EF of 60 to 52%, grade 1 diastolic dysfunction Awaiting speech, physical therapy, Occupational Therapy consult -Patient will need ongoing monitoring on telemetry -LDL 158, HDL 36 -Hemoglobin A1c of 6 -Currently on aspirin, Brilinta, Lipitor 80 mg -IR consulted for consideration of cerebral angiogram and potential left ICA stenting  Hypokalemia- (present on admission) Supplemented and corrected  Leukocytosis- (present on admission) - Has history of chronic leukemia and chronic leukocytosis. -We recommend to follow-up with his oncologist as an outpatient  Essential  hypertension- (present on admission) Holding lisinopril for permissive hypertension -IV labetalol as needed              DVT prophylaxis:heparin injection 5,000 Units Start: 01/09/22 2315 SCD's Start: 01/09/22 2307     Code Status: DNR  Family Communication: Called and discussed with son on phone on 2/9  Patient status: Inpatient  Anticipated discharge to: Home tomorrow   Consultants: Neurology, IR  Procedures: Left ICA stenting  Antimicrobials:  Anti-infectives (From admission, onward)    None       Subjective:  Patient seen and examined at the bedside this morning.  Hemodynamically stable.  Alert and oriented.  Denies new complaints today.  Waiting for cerebral angiogram Objective: Vitals:   01/12/22 0015 01/12/22 0303 01/12/22 0817 01/12/22 0958  BP: (!) 145/92 (!) 162/20 139/77 (!) 189/82  Pulse: 80 84 75 86  Resp: 17 16 14 17   Temp: 97.6 F (36.4 C) 98 F (36.7 C) 98.2 F (36.8 C) 98.9 F (37.2 C)  TempSrc: Oral Oral Oral Oral  SpO2: 99% 98% 93% 97%  Weight:    77.1 kg  Height:    5\' 9"  (1.753 m)    Intake/Output Summary (Last 24 hours) at 01/12/2022 1328 Last data filed at 01/11/2022 2200 Gross per 24 hour  Intake 351.69 ml  Output --  Net 351.69 ml   Filed Weights   01/09/22 1609 01/12/22 0958  Weight: 77.2 kg 77.1 kg    Examination:  General exam: Overall comfortable, not in distress HEENT: Mild left facial droop Respiratory system:  no wheezes or crackles  Cardiovascular system: S1 & S2 heard, RRR.  Gastrointestinal system: Abdomen is nondistended, soft and nontender. Central nervous system: Alert and oriented, mild left facial droop, mild weakness on the left side Extremities: No edema, no clubbing ,no cyanosis  Skin: No rashes, no ulcers,no icterus     Data Reviewed: I have personally reviewed following labs and imaging studies  CBC: Recent Labs  Lab 01/09/22 1620 01/10/22 0410 01/12/22 0201  WBC 40.0* 40.4* 39.6*   NEUTROABS 3.9  --   --   HGB 13.0 12.7* 12.8*  HCT 41.5 41.7 39.9  MCV 97.2 97.9 93.0  PLT 182 150 182   Basic Metabolic Panel: Recent Labs  Lab 01/09/22 1620 01/10/22 0410 01/12/22 0201  NA 140 141 138  K 3.4* 4.0 4.2  CL 104 108 106  CO2 29 26 22   GLUCOSE 88 90 93  BUN 22 20 18   CREATININE 1.28* 1.17 1.28*  CALCIUM 9.4 9.0 9.5  MG  --  1.9  --      Recent Results (from the past 240 hour(s))  Resp Panel by RT-PCR (Flu A&B, Covid) Nasopharyngeal Swab     Status: None   Collection Time: 01/09/22  4:40 PM   Specimen: Nasopharyngeal Swab; Nasopharyngeal(NP) swabs in vial transport medium  Result Value Ref Range Status   SARS Coronavirus 2 by RT PCR NEGATIVE NEGATIVE Final    Comment: (NOTE) SARS-CoV-2 target nucleic acids are NOT DETECTED.  The SARS-CoV-2 RNA is generally detectable in upper respiratory specimens during the acute phase of infection. The lowest concentration of SARS-CoV-2 viral copies this assay can detect is 138 copies/mL. A negative result does not preclude SARS-Cov-2 infection and should not be used as the sole basis for treatment or other patient management decisions. A negative result may occur with  improper specimen collection/handling, submission of specimen other than nasopharyngeal swab, presence of viral mutation(s) within the areas targeted by this assay, and inadequate number of viral copies(<138 copies/mL). A negative result must be combined with clinical observations, patient history, and epidemiological information. The expected result is Negative.  Fact Sheet for Patients:  EntrepreneurPulse.com.au  Fact Sheet for Healthcare Providers:  IncredibleEmployment.be  This test is no t yet approved or cleared by the [place] FDA and  has been authorized for detection and/or diagnosis of SARS-CoV-2 by FDA under an Emergency Use Authorization (EUA). This EUA will remain  in effect (meaning this test  can be used) for the duration of the COVID-19 declaration under Section 564(b)(1) of the Act, 21 U.S.C.section 360bbb-3(b)(1), unless the authorization is terminated  or revoked sooner.       Influenza A by PCR NEGATIVE NEGATIVE Final   Influenza B by PCR NEGATIVE NEGATIVE Final    Comment: (NOTE) The Xpert Xpress SARS-CoV-2/FLU/RSV plus assay is intended as an aid in the diagnosis of influenza from Nasopharyngeal swab specimens and should not be used as a sole basis for treatment. Nasal washings and aspirates are unacceptable for Xpert Xpress SARS-CoV-2/FLU/RSV testing.  Fact Sheet for Patients: EntrepreneurPulse.com.au  Fact Sheet for Healthcare Providers: IncredibleEmployment.be  This test is not yet approved or cleared by the [place] FDA and has been authorized for detection and/or diagnosis of SARS-CoV-2 by FDA under an Emergency Use Authorization (EUA). This EUA will remain in effect (meaning this test can be used) for the duration of the COVID-19 declaration under Section 564(b)(1) of the Act, 21 U.S.C. section 360bbb-3(b)(1), unless the authorization is terminated or revoked.  Performed at [facility], [address] [place], [place] [address]   Surgical pcr screen     Status: Abnormal   Collection Time: 01/12/22  9:37 AM   Specimen: Nasal Mucosa; Nasal Swab  Result Value Ref Range Status  MRSA, PCR NEGATIVE NEGATIVE Final   Staphylococcus aureus POSITIVE (A) NEGATIVE Final    Comment: (NOTE) The Xpert SA Assay (FDA approved for NASAL specimens in patients 51 years of age and older), is one component of a comprehensive surveillance program. It is not intended to diagnose infection nor to guide or monitor treatment. Performed at [facility], [place] [address] [place], [place] [address]      Radiology Studies: No results found.  Scheduled Meds:  [MAR Hold] aspirin EC  81 mg Oral Daily   [MAR Hold] atorvastatin   80 mg Oral Daily   [MAR Hold] heparin  5,000 Units Subcutaneous Q8H   lidocaine       [MAR Hold] ticagrelor  90 mg Oral BID   Continuous Infusions:  sodium chloride 50 mL/hr at 01/12/22 0016   sodium chloride 10 mL/hr at 01/12/22 1026     LOS: 2 days   Shelly Coss, MD [facility] P2/08/2022, 1:28 PM

## 2022-01-12 NOTE — Progress Notes (Signed)
Patient assessed at the bedside in 4N23. 19 male visitors at the bedside. The patient is awake/alert and has no complaints of pain or discomfort. Right groin vascular site is clean, soft and dry. Patient remains on a low Neo-synephrine drip for blood pressure support.   Tentative discharge home tomorrow. NIR will see patient in the morning to assess.  Soyla Dryer, [place] [phone] 01/12/2022, 3:50 PM

## 2022-01-12 NOTE — Progress Notes (Signed)
OT Cancellation Note  Patient Details Name: DAANISH COPES MRN: [id number] DOB: 05-03-1946   Cancelled Treatment:    Reason Eval/Treat Not Completed: Patient at procedure or test/ unavailable. Pt off the floor for stent procedure. OT will follow up as time allows.   Seanpaul Preece H., OTR/L Acute Rehabilitation  Jenniferann Stuckert Elane Yolanda Bonine 01/12/2022, 2:08 PM

## 2022-01-12 NOTE — Procedures (Signed)
INTERVENTIONAL NEURORADIOLOGY BRIEF POSTPROCEDURE NOTE  DIAGNOSTIC CEREBRAL ANGIOGRAM AND LEFT CAROTID STENTING AND ANGIOPLASTY WITH CEREBRAL PROTECTION DEVICE   Attending: Dr. [name]  Assistant: None.   Diagnosis: Right carotid occlusion; left carotid stenosis.   Access site: Right common femoral artery.   Access closure: Perclose prostyle.   Anesthesia: Monitored anesthesia care (MAC).   Medication used: Refer to anesthesia documentation.  Complications: None.   Estimated blood loss: 20-30 mL.   Specimen: None.   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  Atherosclerotic changes of the left carotid bifurcation resulting in approximately 65% stenosis.  Left carotid stenting with in stent angioplasty was performed using cerebral protection device.  No evidence of thromboembolic complication.   The patient tolerated the procedure well without incident or complication and is in stable condition.  PLAN:  - Transfer to ICU for overnight observation. - SBP 120-140 mm Hg. - Bed rest x6 hours post femoral puncture.

## 2022-01-12 NOTE — Sedation Documentation (Signed)
Pt transported to PACU bay 8 via bed accompanied by RN and CRNA. Hanna at bedside to receive pt. Handoff completed. Right groin site remains level 0. Bilateral lower distal pulses present via doppler. Pt awake alert and oriented. No s/s of distress at this time.

## 2022-01-12 NOTE — Progress Notes (Addendum)
STROKE TEAM PROGRESS NOTE   SUBJECTIVE (INTERVAL HISTORY) Patient is seen in his room with two family members at the bedside.  He has recently returned from having his left carotid stent placed and states that he is feeling fine.  He has been hemodynamically stable and his neurological exam is stable.  Plan is to keep SBP 120-140 overnight.  OBJECTIVE Temp:  [97.6 F (36.4 C)-98.9 F (37.2 C)] 98.5 F (36.9 C) (02/09 1455) Pulse Rate:  [75-86] 80 (02/09 1530) Cardiac Rhythm: Normal sinus rhythm (02/09 1455) Resp:  [14-24] 18 (02/09 1530) BP: (123-189)/(20-92) 124/77 (02/09 1530) SpO2:  [93 %-99 %] 97 % (02/09 1530) Weight:  [77.1 kg] 77.1 kg (02/09 0958)  No results for input(s): GLUCAP in the last 168 hours. Recent Labs  Lab 01/09/22 1620 01/10/22 0410 01/12/22 0201  NA 140 141 138  K 3.4* 4.0 4.2  CL 104 108 106  CO2 29 26 22   GLUCOSE 88 90 93  BUN 22 20 18   CREATININE 1.28* 1.17 1.28*  CALCIUM 9.4 9.0 9.5  MG  --  1.9  --     Recent Labs  Lab 01/10/22 0410  AST 17  ALT 13  ALKPHOS 60  BILITOT 0.4  PROT 6.5  ALBUMIN 3.5    Recent Labs  Lab 01/09/22 1620 01/10/22 0410 01/12/22 0201  WBC 40.0* 40.4* 39.6*  NEUTROABS 3.9  --   --   HGB 13.0 12.7* 12.8*  HCT 41.5 41.7 39.9  MCV 97.2 97.9 93.0  PLT 182 150 168    No results for input(s): CKTOTAL, CKMB, CKMBINDEX, TROPONINI in the last 168 hours. Recent Labs    01/09/22 1620  LABPROT 13.3  INR 1.0    No results for input(s): COLORURINE, LABSPEC, PHURINE, GLUCOSEU, HGBUR, BILIRUBINUR, KETONESUR, PROTEINUR, UROBILINOGEN, NITRITE, LEUKOCYTESUR in the last 72 hours.  Invalid input(s): APPERANCEUR     Component Value Date/Time   CHOL 218 (H) 01/10/2022 0410   TRIG 119 01/10/2022 0410   HDL 36 (L) 01/10/2022 0410   CHOLHDL 6.1 01/10/2022 0410   VLDL 24 01/10/2022 0410   LDLCALC 158 (H) 01/10/2022 0410   Lab Results  Component Value Date   HGBA1C 6.0 (H) 01/10/2022      Component Value Date/Time    LABOPIA NONE DETECTED 01/12/2022 0630   COCAINSCRNUR NONE DETECTED 01/12/2022 0630   LABBENZ NONE DETECTED 01/12/2022 0630   AMPHETMU NONE DETECTED 01/12/2022 0630   THCU NONE DETECTED 01/12/2022 0630   LABBARB NONE DETECTED 01/12/2022 0630    No results for input(s): ETH in the last 168 hours.  I have personally reviewed the radiological images below and agree with the radiology interpretations.  CT ANGIO HEAD NECK W WO CM  Result Date: 01/09/2022 CLINICAL DATA:  Neuro deficit, acute, stroke suspected EXAM: CT ANGIOGRAPHY HEAD AND NECK TECHNIQUE: Multidetector CT imaging of the head and neck was performed using the standard protocol during bolus administration of intravenous contrast. Multiplanar CT image reconstructions and MIPs were obtained to evaluate the vascular anatomy. Carotid stenosis measurements (when applicable) are obtained utilizing NASCET criteria, using the distal internal carotid diameter as the denominator. RADIATION DOSE REDUCTION: This exam was performed according to the departmental dose-optimization program which includes automated exposure control, adjustment of the mA and/or kV according to patient size and/or use of iterative reconstruction technique. CONTRAST:  50mL OMNIPAQUE IOHEXOL 350 MG/ML SOLN COMPARISON:  None. FINDINGS: CT HEAD FINDINGS Brain: Patchy hypoattenuation in the right frontal white matter and bilateral basal ganglia.  No acute hemorrhage, mass lesion, midline shift, hydrocephalus or visible extra-axial fluid collection. Mild atrophy. Vascular: See below. Skull: No acute fracture. Sinuses: Small right maxillary sinus with mild mucosal thickening. Otherwise, clear sinuses. Orbits: No acute finding. Review of the MIP images confirms the above findings CTA NECK FINDINGS Aortic arch: Great vessel origins are patent. Right carotid system: Mixed calcific and noncalcific atherosclerosis at the carotid bifurcation. 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 The ICA remains  non-opacified in the neck. Left carotid system: Mixed calcific and noncalcific atherosclerosis at the carotid bifurcation and involving the proximal ICA. Approximately 70-80% stenosis of the proximal ICA. Vertebral arteries: Co dominant. Severe right vertebral artery origin stenosis. Otherwise, vertebral arteries are patent without significant stenosis. Skeleton: No evidence of acute abnormality on limited assessment. Other neck: Increased number of lymph nodes in the neck bilaterally and in the visualized upper mediastinum with enlarged bilateral submandibular and upper cervical chain nodes. Left level 2 node measures up to 1.5 cm short axis and is rounded. Submandibular nodes measure up to approximately 1.3 cm short axis on the right. Upper chest: Visualized lung apices are clear. Review of the MIP images confirms the above findings CTA HEAD FINDINGS Anterior circulation: Reconstitution of the right ICA at the proximal petrous segment. Bilateral intracranial ICA atherosclerosis with moderate to severe bilateral paraclinoid ICA stenosis. Bilateral MCAs are patent with multifocal mild-to-moderate M1 and M2 MCA stenosis. Small right A1 ACA, probably congenital given prominent left A1 ACA. Otherwise, ACAs are patent without proximal high-grade stenosis Posterior circulation: Bilateral intradural vertebral arteries are patent. Severe stenosis of the distal right intradural vertebral artery. Moderate stenosis of the distal left intradural vertebral artery. Basilar artery is patent with multifocal mild stenosis. Multifocal severe left P1 and P2 PCA stenosis. Moderate right P1 PCA stenosis. Venous sinuses: As permitted by contrast timing, patent. Review of the MIP images confirms the above findings IMPRESSION: CT head: 1. Patchy hypoattenuation in the right frontal white matter and bilateral basal ganglia, which could represent chronic microvascular ischemic disease versus age indeterminate infarcts in the absence of  priors. MRI could provide more sensitive evaluation for acute infarct. 2. No evidence of acute hemorrhage. CTA: 1. 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 2. Moderate to severe bilateral paraclinoid ICA stenosis. 3. Multifocal severe left P1 and P2 PCA stenosis. Moderate right P1 PCA stenosis. 4. Severe right vertebral artery origin stenosis. Also, severe right and moderate left distal intradural vertebral artery stenosis. 5. Approximately 70-80% stenosis of the proximal left ICA in the neck. 6. Multifocal mild-to-moderate bilateral M1 and M2 MCA stenosis 7. Increased number of lymph nodes in the neck bilaterally and in the visualized upper mediastinum with enlarged and somewhat rounded submandibular and upper cervical chain nodes. While nonspecific, findings raise concern for underlying lymphoproliferative disorder. Consider follow-up CT neck and possibly CT chest/abdomen/pelvis for further evaluation. Findings discussed with Ileene Patrick PA via telephone at 7:16 PM Electronically Signed   By: Margaretha Sheffield M.D.   On: 01/09/2022 19:19   MR BRAIN WO CONTRAST  Result Date: 01/10/2022 CLINICAL DATA:  Neuro deficit, acute, stroke suspected EXAM: MRI HEAD WITHOUT CONTRAST TECHNIQUE: Multiplanar, multiecho pulse sequences of the brain and surrounding structures were obtained without intravenous contrast. COMPARISON:  None. FINDINGS: Brain: There is cortical/subcortical reduced diffusion in the right frontal lobe including involvement of lateral precentral gyrus. Patchy and confluent areas of T2  hyperintensity in the supratentorial white matter nonspecific but probably reflect mild to moderate chronic microvascular ischemic changes. There are chronic right frontoparietal cortical infarcts. Small chronic infarct of the right frontal subcortical white matter. Prominent perivascular spaces and probable  superimposed chronic small vessel infarcts of the central gray nuclei and white matter. Chronic blood products along the right basal ganglia and adjacent white matter. No intracranial mass or mass effect. There is no hydrocephalus or extra-axial fluid collection. Prominence of the ventricles and sulci reflects mild parenchymal volume loss. Vascular: Diminished right ICA flow void. Skull and upper cervical spine: Marrow signal is mildly heterogeneous but otherwise unremarkable. Sinuses/Orbits: Mild mucosal thickening.  Orbits are unremarkable. Other: Sella is unremarkable.  Mastoid air cells are clear. IMPRESSION: Small acute cortical/subcortical right frontal infarcts with involvement of lateral precentral gyrus. 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 Electronically Signed   By: Macy Mis M.D.   On: 01/10/2022 09:41   ECHOCARDIOGRAM COMPLETE  Result Date: 01/10/2022    ECHOCARDIOGRAM REPORT   Patient Name:   Daniel Osborne Date of Exam: 01/10/2022 Medical Rec #:  [id number]       Height:       69.0 in Accession #:    [id number]      Weight:       170.2 lb Date of Birth:  1946-04-06       BSA:          1.929 m Patient Age:    29 years        BP:           141/84 mmHg Patient Gender: M               HR:           81 bpm. Exam Location:  [facility] Procedure: 2D Echo, Cardiac Doppler and Color Doppler Indications:    Stroke I63.9  History:        Patient has no prior history of Echocardiogram examinations.                 Risk Factors:Hypertension. CVA (cerebral vascular accident)                 ([place]).  Sonographer:    Alvino Chapel RCS Referring Phys: [id number] ASIA B [place]  1. Left ventricular ejection fraction, by estimation, is 60 to 65%. The left ventricle has normal function. The left ventricle has no regional wall motion abnormalities. Left ventricular diastolic parameters are consistent with Grade I diastolic dysfunction (impaired relaxation).  2. Right ventricular  systolic function is normal. The right ventricular size is normal. Tricuspid regurgitation signal is inadequate for assessing PA pressure.  3. The mitral valve is degenerative. Trivial mitral valve regurgitation. No evidence of mitral stenosis.  4. The aortic valve is tricuspid. Aortic valve regurgitation is trivial. Aortic valve sclerosis is present, with no evidence of aortic valve stenosis.  5. The inferior vena cava is normal in size with greater than 50% respiratory variability, suggesting right atrial pressure of 3 mmHg. Comparison(s): No prior Echocardiogram. FINDINGS  Left Ventricle: Left ventricular ejection fraction, by estimation, is 60 to 65%. The left ventricle has normal function. The left ventricle has no regional wall motion abnormalities. The left ventricular internal cavity size was normal in size. There is  no left ventricular hypertrophy. Left ventricular diastolic parameters are consistent with Grade I diastolic dysfunction (impaired relaxation). Right Ventricle: The right ventricular size is normal. No increase in right ventricular  wall thickness. Right ventricular systolic function is normal. Tricuspid regurgitation signal is inadequate for assessing PA pressure. Left Atrium: Left atrial size was normal in size. Right Atrium: Right atrial size was normal in size. Pericardium: There is no evidence of pericardial effusion. Mitral Valve: The mitral valve is degenerative in appearance. Trivial mitral valve regurgitation. No evidence of mitral valve stenosis. Tricuspid Valve: The tricuspid valve is normal in structure. Tricuspid valve regurgitation is trivial. No evidence of tricuspid stenosis. Aortic Valve: The aortic valve is tricuspid. Aortic valve regurgitation is trivial. Aortic valve sclerosis is present, with no evidence of aortic valve stenosis. Aortic valve mean gradient measures 12.0 mmHg. Aortic valve peak gradient measures 20.6 mmHg. Aortic valve area, by VTI measures 1.42 cm. Pulmonic  Valve: The pulmonic valve was not well visualized. Pulmonic valve regurgitation is not visualized. No evidence of pulmonic stenosis. Aorta: The aortic root is normal in size and structure. Venous: The inferior vena cava is normal in size with greater than 50% respiratory variability, suggesting right atrial pressure of 3 mmHg. IAS/Shunts: No atrial level shunt detected by color flow Doppler.  LEFT VENTRICLE PLAX 2D LVIDd:         4.20 cm   Diastology LVIDs:         2.80 cm   LV e' medial:    7.94 cm/s LV PW:         1.00 cm   LV E/e' medial:  11.6 LV IVS:        0.90 cm   LV e' lateral:   5.98 cm/s LVOT diam:     1.90 cm   LV E/e' lateral: 15.5 LV SV:         67 LV SV Index:   35 LVOT Area:     2.84 cm  RIGHT VENTRICLE RV S prime:     12.10 cm/s TAPSE (M-mode): 2.1 cm LEFT ATRIUM             Index        RIGHT ATRIUM           Index LA diam:        3.60 cm 1.87 cm/m   RA Area:     15.70 cm LA Vol (A2C):   62.3 ml 32.30 ml/m  RA Volume:   40.20 ml  20.84 ml/m LA Vol (A4C):   45.2 ml 23.43 ml/m LA Biplane Vol: 55.0 ml 28.51 ml/m  AORTIC VALVE AV Area (Vmax):    1.44 cm AV Area (Vmean):   1.28 cm AV Area (VTI):     1.42 cm AV Vmax:           227.00 cm/s AV Vmean:          160.000 cm/s AV VTI:            0.470 m AV Peak Grad:      20.6 mmHg AV Mean Grad:      12.0 mmHg LVOT Vmax:         115.00 cm/s LVOT Vmean:        72.000 cm/s LVOT VTI:          0.235 m LVOT/AV VTI ratio: 0.50  AORTA Ao Root diam: 3.10 cm MITRAL VALVE MV Area (PHT): 3.37 cm     SHUNTS MV Decel Time: 225 msec     Systemic VTI:  0.24 m MV E velocity: 92.50 cm/s   Systemic Diam: 1.90 cm MV A velocity: 111.00 cm/s MV E/A ratio:  0.83  Rudean Haskell MD Electronically signed by Rudean Haskell MD Signature Date/Time: 01/10/2022/12:08:44 PM    Final      PHYSICAL EXAM  Temp:  [97.6 F (36.4 C)-98.9 F (37.2 C)] 98.5 F (36.9 C) (02/09 1455) Pulse Rate:  [75-86] 80 (02/09 1530) Resp:  [14-24] 18 (02/09 1530) BP:  (123-189)/(20-92) 124/77 (02/09 1530) SpO2:  [93 %-99 %] 97 % (02/09 1530) Weight:  [77.1 kg] 77.1 kg (02/09 0958)  General - Well nourished, well developed, in no apparent distress.  Cardiovascular - Regular rhythm and rate.   NEURO:  Mental Status: AA&Ox3  Speech/Language: speech is with mild dysarthria, most notable on repetition.  Cranial Nerves:  II: PERRL.  III, IV, VI: EOMI. Eyelids elevate symmetrically.  V: Sensation is intact to light touch and diminished on the left VII: left sided facial droop present VIII: hearing intact to voice. IX, X: Phonation is normal.  XII: tongue is midline without fasciculations. Motor: 5/5 strength to RUE and BLE, very slightly diminished strength in LUE  Tone: is normal and bulk is normal Sensation- Intact to light touch bilaterally. Extinction absent to light touch to DSS.   Coordination: FTN intact bilaterally. No drift.  Gait- deferred    ASSESSMENT/PLAN Daniel Osborne is a 76 y.o. male with history of HTN, CLL admitted for left facial droop and slurry speech. No tPA given due to OSW.  He is now s/p left ICA stent placement for 70-80% stenosis.  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 Norma Fredrickson today 2D Echo  EF 60-65% LDL 158 HgbA1c 6.0 Heparin subq for VTE prophylaxis No antithrombotic prior to admission, now on aspirin 81 mg daily and Brilinta (ticagrelor) 90 mg bid after loading dose. Patient counseled to be compliant with his antithrombotic  medications Ongoing aggressive stroke risk factor management Therapy recommendations:  pending Disposition:  pending  Hypertension Stable Keep SBP 120-140 after procedure for 24h  Hyperlipidemia Home meds:  none  LDL 158, goal < 70 Now on lipitor 80 Continue statin at discharge  Other Stroke Risk Factors Advanced age  Other Active Problems CLL -  [facility]. Per pt, his baseline WBC is around 40, no specific treatment as long as it is stable. Currently 39.6.   Hospital day # [place] , MSN, AGACNP-BC [facility] See Amion for schedule and pager information 01/12/2022 3:55 PM  ATTENDING NOTE: I reviewed above note and agree with the assessment and plan. Pt was seen and examined.   Patient was seen in the ICU after procedure today.  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  Atherosclerotic changes of the left carotid bifurcation resulting in approximately 65% stenosis.  Left carotid stenting placed.  Currently in ICU, patient awake alert, orientated x3, no aphasia mild dysarthria, follows some commands.  No gaze palsy or anopia, mild left facial droop, mild left hand dexterity difficulty, however moving all extremities symmetrically.  Sensation symmetrical.  Finger-to-nose intact.  Continue ICU monitoring overnight, BP goal 1 20-1 40 for 24 hours postprocedure, continue aspirin and Brilinta as well as Lipitor.  For detailed assessment and plan, please refer to above as I have made changes wherever appropriate.   Rosalin Hawking, MD PhD Stroke Neurology 01/12/2022 6:39 PM    To contact Stroke Continuity provider, please refer to [email or web address] After hours, contact General Neurology

## 2022-01-12 NOTE — Care Management Important Message (Signed)
Important Message  Patient Details  Name: Daniel Osborne MRN: [id number] Date of Birth: Oct 28, 1946   Medicare Important Message Given:  Yes     Orbie Pyo 01/12/2022, 3:48 PM

## 2022-01-12 NOTE — H&P (Signed)
Referring Physician(s): Dr. 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 He has no complaints this morning and he is still in agreement to proceed with today's cerebral angiogram with possible stent placement.   Allergies: Patient has no known allergies.  Medications: Prior to Admission medications   Medication Sig Start Date End Date Taking? Authorizing Provider  acetaminophen (TYLENOL) 500 MG tablet Take 500 mg by mouth every 6 (six) hours as needed.   Yes [provider]  lisinopril (ZESTRIL) 10 MG tablet Take 5 mg by mouth daily.   Yes [provider]  amoxicillin-clavulanate (AUGMENTIN) 875-125 MG tablet TAKE 1 TABLET BY MOUTH 2 (TWO) TIMES DAILY. Patient not taking: Reported on 01/09/2022 12/07/15   Claretta Fraise, MD  fluticasone [facility]) 50 MCG/ACT nasal spray Place 2 sprays into both nostrils daily. Patient not taking: Reported on 01/09/2022 11/19/15   Sharion Balloon, FNP     Vital Signs: BP 139/77 (BP Location: Left Arm)    Pulse 75    Temp 98.2 F (36.8 C) (Oral)    Resp 14    Ht _0  (1.753 m)    Wt 170 lb 3.1 oz (77.2 kg)    SpO2 93%    BMI 25.13 kg/m   Physical Exam Constitutional:      General: He is not in acute distress. HENT:     Mouth/Throat:     Mouth: Mucous membranes are moist.     Pharynx: Oropharynx is clear.  Cardiovascular:     Rate and Rhythm: Normal rate and regular rhythm.     Pulses: Normal pulses.     Heart sounds: Normal heart sounds.  Pulmonary:     Effort: Pulmonary effort is normal.     Breath sounds: Normal breath sounds.  Abdominal:     General: Bowel sounds are normal.     Palpations: Abdomen is soft.  Skin:    General: Skin is warm and dry.  Neurological:     Mental Status: He is alert and oriented to person, place, and time.  Psychiatric:         Mood and Affect: Mood normal.        Behavior: Behavior normal.        Thought Content: Thought content normal.        Judgment: Judgment normal.    Imaging: CT ANGIO HEAD NECK W WO CM  Result Date: 01/09/2022 CLINICAL DATA:  Neuro deficit, acute, stroke suspected EXAM: CT ANGIOGRAPHY HEAD AND NECK TECHNIQUE: Multidetector CT imaging of the head and neck was performed using the standard protocol during bolus administration of intravenous contrast. Multiplanar CT image reconstructions and MIPs were obtained to evaluate the vascular anatomy. Carotid stenosis measurements (when applicable) are obtained utilizing NASCET criteria, using the distal internal carotid diameter as the denominator. RADIATION DOSE REDUCTION: This exam was performed according to the departmental dose-optimization program which includes automated exposure control, adjustment of the mA and/or kV according to patient size and/or use of iterative reconstruction technique. CONTRAST:  14m OMNIPAQUE IOHEXOL 350 MG/ML SOLN COMPARISON:  None. FINDINGS: CT HEAD FINDINGS Brain: Patchy hypoattenuation in the right frontal white matter and bilateral basal ganglia. No acute hemorrhage, mass lesion, midline shift, hydrocephalus or visible extra-axial fluid collection. Mild atrophy. Vascular: See below. Skull: No acute fracture. Sinuses: Small right maxillary sinus with mild mucosal thickening.  Otherwise, clear sinuses. Orbits: No acute finding. Review of the MIP images confirms the above findings CTA NECK FINDINGS Aortic arch: Great vessel origins are patent. Right carotid system: Mixed calcific and noncalcific atherosclerosis at the carotid bifurcation. 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 The ICA remains non-opacified in the neck. Left carotid system: Mixed calcific and noncalcific atherosclerosis at the carotid bifurcation and involving the proximal ICA. Approximately 70-80% stenosis of the proximal ICA. Vertebral arteries: Co dominant. Severe right  vertebral artery origin stenosis. Otherwise, vertebral arteries are patent without significant stenosis. Skeleton: No evidence of acute abnormality on limited assessment. Other neck: Increased number of lymph nodes in the neck bilaterally and in the visualized upper mediastinum with enlarged bilateral submandibular and upper cervical chain nodes. Left level 2 node measures up to 1.5 cm short axis and is rounded. Submandibular nodes measure up to approximately 1.3 cm short axis on the right. Upper chest: Visualized lung apices are clear. Review of the MIP images confirms the above findings CTA HEAD FINDINGS Anterior circulation: Reconstitution of the right ICA at the proximal petrous segment. Bilateral intracranial ICA atherosclerosis with moderate to severe bilateral paraclinoid ICA stenosis. Bilateral MCAs are patent with multifocal mild-to-moderate M1 and M2 MCA stenosis. Small right A1 ACA, probably congenital given prominent left A1 ACA. Otherwise, ACAs are patent without proximal high-grade stenosis Posterior circulation: Bilateral intradural vertebral arteries are patent. Severe stenosis of the distal right intradural vertebral artery. Moderate stenosis of the distal left intradural vertebral artery. Basilar artery is patent with multifocal mild stenosis. Multifocal severe left P1 and P2 PCA stenosis. Moderate right P1 PCA stenosis. Venous sinuses: As permitted by contrast timing, patent. Review of the MIP images confirms the above findings IMPRESSION: CT head: 1. Patchy hypoattenuation in the right frontal white matter and bilateral basal ganglia, which could represent chronic microvascular ischemic disease versus age indeterminate infarcts in the absence of priors. MRI could provide more sensitive evaluation for acute infarct. 2. No evidence of acute hemorrhage. CTA: 1. Right ICA origin occlusion in the neck with non opacification of the remainder of the neck and reconstitution at the level of the proximal  petrous ICA. 2. Moderate to severe bilateral paraclinoid ICA stenosis. 3. Multifocal severe left P1 and P2 PCA stenosis. Moderate right P1 PCA stenosis. 4. Severe right vertebral artery origin stenosis. Also, severe right and moderate left distal intradural vertebral artery stenosis. 5. Approximately 70-80% stenosis of the proximal left ICA in the neck. 6. Multifocal mild-to-moderate bilateral M1 and M2 MCA stenosis 7. Increased number of lymph nodes in the neck bilaterally and in the visualized upper mediastinum with enlarged and somewhat rounded submandibular and upper cervical chain nodes. While nonspecific, findings raise concern for underlying lymphoproliferative disorder. Consider follow-up CT neck and possibly CT chest/abdomen/pelvis for further evaluation. Findings discussed with Ileene Patrick PA via telephone at 7:16 PM Electronically Signed   By: Margaretha Sheffield M.D.   On: 01/09/2022 19:19   MR BRAIN WO CONTRAST  Result Date: 01/10/2022 CLINICAL DATA:  Neuro deficit, acute, stroke suspected EXAM: MRI HEAD WITHOUT CONTRAST TECHNIQUE: Multiplanar, multiecho pulse sequences of the brain and surrounding structures were obtained without intravenous contrast. COMPARISON:  None. FINDINGS: Brain: There is cortical/subcortical reduced diffusion in the right frontal lobe including involvement of lateral precentral gyrus. Patchy and confluent areas of T2 hyperintensity in the supratentorial white matter nonspecific but probably reflect mild to moderate chronic microvascular ischemic changes. There are chronic right frontoparietal cortical infarcts. Small chronic infarct of the right frontal  subcortical white matter. Prominent perivascular spaces and probable superimposed chronic small vessel infarcts of the central gray nuclei and white matter. Chronic blood products along the right basal ganglia and adjacent white matter. No intracranial mass or mass effect. There is no hydrocephalus or extra-axial fluid collection.  Prominence of the ventricles and sulci reflects mild parenchymal volume loss. Vascular: Diminished right ICA flow void. Skull and upper cervical spine: Marrow signal is mildly heterogeneous but otherwise unremarkable. Sinuses/Orbits: Mild mucosal thickening.  Orbits are unremarkable. Other: Sella is unremarkable.  Mastoid air cells are clear. IMPRESSION: Small acute cortical/subcortical right frontal infarcts with involvement of lateral precentral gyrus. 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 Electronically Signed   By: Macy Mis M.D.   On: 01/10/2022 09:41   ECHOCARDIOGRAM COMPLETE  Result Date: 01/10/2022    ECHOCARDIOGRAM REPORT   Patient Name:   Daniel Osborne Date of Exam: 01/10/2022 Medical Rec #:  [id number]       Height:       69.0 in Accession #:    [id number]      Weight:       170.2 lb Date of Birth:  Jan 26, 1946       BSA:          1.929 m Patient Age:    69 years        BP:           141/84 mmHg Patient Gender: M               HR:           81 bpm. Exam Location:  [facility] Procedure: 2D Echo, Cardiac Doppler and Color Doppler Indications:    Stroke I63.9  History:        Patient has no prior history of Echocardiogram examinations.                 Risk Factors:Hypertension. CVA (cerebral vascular accident)                 ([place]).  Sonographer:    Alvino Chapel RCS Referring Phys: [id number] ASIA B [place]  1. Left ventricular ejection fraction, by estimation, is 60 to 65%. The left ventricle has normal function. The left ventricle has no regional wall motion abnormalities. Left ventricular diastolic parameters are consistent with Grade I diastolic dysfunction (impaired relaxation).  2. Right ventricular systolic function is normal. The right ventricular size is normal. Tricuspid regurgitation signal is inadequate for assessing PA pressure.  3. The mitral valve is degenerative. Trivial mitral valve regurgitation. No evidence of mitral stenosis.  4. The aortic  valve is tricuspid. Aortic valve regurgitation is trivial. Aortic valve sclerosis is present, with no evidence of aortic valve stenosis.  5. The inferior vena cava is normal in size with greater than 50% respiratory variability, suggesting right atrial pressure of 3 mmHg. Comparison(s): No prior Echocardiogram. FINDINGS  Left Ventricle: Left ventricular ejection fraction, by estimation, is 60 to 65%. The left ventricle has normal function. The left ventricle has no regional wall motion abnormalities. The left ventricular internal cavity size was normal in size. There is  no left ventricular hypertrophy. Left ventricular diastolic parameters are consistent with Grade I diastolic dysfunction (impaired relaxation). Right Ventricle: The right ventricular size is normal. No increase in right ventricular wall thickness. Right ventricular systolic function is normal. Tricuspid regurgitation signal is inadequate for assessing PA pressure. Left Atrium: Left atrial size was normal in size. Right Atrium: Right atrial size  was normal in size. Pericardium: There is no evidence of pericardial effusion. Mitral Valve: The mitral valve is degenerative in appearance. Trivial mitral valve regurgitation. No evidence of mitral valve stenosis. Tricuspid Valve: The tricuspid valve is normal in structure. Tricuspid valve regurgitation is trivial. No evidence of tricuspid stenosis. Aortic Valve: The aortic valve is tricuspid. Aortic valve regurgitation is trivial. Aortic valve sclerosis is present, with no evidence of aortic valve stenosis. Aortic valve mean gradient measures 12.0 mmHg. Aortic valve peak gradient measures 20.6 mmHg. Aortic valve area, by VTI measures 1.42 cm. Pulmonic Valve: The pulmonic valve was not well visualized. Pulmonic valve regurgitation is not visualized. No evidence of pulmonic stenosis. Aorta: The aortic root is normal in size and structure. Venous: The inferior vena cava is normal in size with greater than 50%  respiratory variability, suggesting right atrial pressure of 3 mmHg. IAS/Shunts: No atrial level shunt detected by color flow Doppler.  LEFT VENTRICLE PLAX 2D LVIDd:         4.20 cm   Diastology LVIDs:         2.80 cm   LV e' medial:    7.94 cm/s LV PW:         1.00 cm   LV E/e' medial:  11.6 LV IVS:        0.90 cm   LV e' lateral:   5.98 cm/s LVOT diam:     1.90 cm   LV E/e' lateral: 15.5 LV SV:         67 LV SV Index:   35 LVOT Area:     2.84 cm  RIGHT VENTRICLE RV S prime:     12.10 cm/s TAPSE (M-mode): 2.1 cm LEFT ATRIUM             Index        RIGHT ATRIUM           Index LA diam:        3.60 cm 1.87 cm/m   RA Area:     15.70 cm LA Vol (A2C):   62.3 ml 32.30 ml/m  RA Volume:   40.20 ml  20.84 ml/m LA Vol (A4C):   45.2 ml 23.43 ml/m LA Biplane Vol: 55.0 ml 28.51 ml/m  AORTIC VALVE AV Area (Vmax):    1.44 cm AV Area (Vmean):   1.28 cm AV Area (VTI):     1.42 cm AV Vmax:           227.00 cm/s AV Vmean:          160.000 cm/s AV VTI:            0.470 m AV Peak Grad:      20.6 mmHg AV Mean Grad:      12.0 mmHg LVOT Vmax:         115.00 cm/s LVOT Vmean:        72.000 cm/s LVOT VTI:          0.235 m LVOT/AV VTI ratio: 0.50  AORTA Ao Root diam: 3.10 cm MITRAL VALVE MV Area (PHT): 3.37 cm     SHUNTS MV Decel Time: 225 msec     Systemic VTI:  0.24 m MV E velocity: 92.50 cm/s   Systemic Diam: 1.90 cm MV A velocity: 111.00 cm/s MV E/A ratio:  0.83 Rudean Haskell MD Electronically signed by Rudean Haskell MD Signature Date/Time: 01/10/2022/12:08:44 PM    Final     Labs:  CBC: Recent Labs    01/09/22 1620  01/10/22 0410 01/12/22 0201  WBC 40.0* 40.4* 39.6*  HGB 13.0 12.7* 12.8*  HCT 41.5 41.7 39.9  PLT 182 150 168    COAGS: Recent Labs    01/09/22 1620  INR 1.0    BMP: Recent Labs    01/09/22 1620 01/10/22 0410 01/12/22 0201  NA 140 141 138  K 3.4* 4.0 4.2  CL 104 108 106  CO2 _0 GLUCOSE 88 90 93  BUN _1 CALCIUM 9.4 9.0 9.5  CREATININE 1.28* 1.17 1.28*   GFRNONAA 59* >60 59*    LIVER FUNCTION TESTS: Recent Labs    01/10/22 0410  BILITOT 0.4  AST 17  ALT 13  ALKPHOS 60  PROT 6.5  ALBUMIN 3.5    Assessment and Plan:  Left Internal Carotid Artery Stenosis: Daniel Osborne, 76 year old male, is scheduled today for an image-guided cerebral arteriogram with possible left internal carotid artery angioplasty/stent. The procedure will be done with MAC and the patient will be admitted to the Neuro ICU for overnight observation.   Risks and benefits of were discussed with the patient including, but not limited to bleeding, infection, vascular injury or contrast induced renal failure.  This interventional procedure involves the use of X-rays and because of the nature of the planned procedure, it is possible that we will have prolonged use of X-ray fluoroscopy.  Potential radiation risks to you include (but are not limited to) the following: - A slightly elevated risk for cancer  several years later in life. This risk is typically less than 0.5% percent. This risk is low in comparison to the normal incidence of human cancer, which is 33% for women and 50% for men according to the [place]. - Radiation induced injury can include skin redness, resembling a rash, tissue breakdown / ulcers and hair loss (which can be temporary or permanent).   The likelihood of either of these occurring depends on the difficulty of the procedure and whether you are sensitive to radiation due to previous procedures, disease, or genetic conditions.   IF your procedure requires a prolonged use of radiation, you will be notified and given written instructions for further action.  It is your responsibility to monitor the irradiated area for the 2 weeks following the procedure and to notify your physician if you are concerned that you have suffered a radiation induced injury.    All of the patient's questions were answered, patient is agreeable to  proceed.  Consent signed and in chart.   Electronically Signed: Soyla Dryer, AGACNP-BC [phone] 01/12/2022, 8:25 AM   I spent a total of 15 Minutes at the the patient's bedside AND on the patient's hospital floor or unit, greater than 50% of which was counseling/coordinating care for cerebral arteriogram with possible left internal carotid artery angioplasty/stent

## 2022-01-12 NOTE — Progress Notes (Signed)
Physical Therapy Treatment and Discharge Patient Details Name: Daniel Osborne MRN: [id number] DOB: [birth year] Today's Date: 01/12/2022   History of Present Illness 76 y.o. 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 PMHx: chronic leukemia and hypertension, and dyslipidemia    PT Comments    Pt exiting bathroom on entry. Pt reports he feels like his L UE is a little better today. Able to demonstrate improved finger to thumb dexterity. Eager to get out of room to pass time while he waits for his stenting procedure this morning. Pt is independent in ambulation and is able to improve gait speed, balance and endurance. D/c plans remain appropriate at this time. PT is signing off. Please reoorder if there is a change in functional status.     Recommendations for follow up therapy are one component of a multi-disciplinary discharge planning process, led by the attending physician.  Recommendations may be updated based on patient status, additional functional criteria and insurance authorization.  Follow Up Recommendations  No PT follow up     Assistance Recommended at Discharge Set up Supervision/Assistance  Patient can return home with the following Assist for transportation;Direct supervision/assist for financial management;Direct supervision/assist for medications management   Equipment Recommendations  None recommended by PT       Precautions / Restrictions Precautions Precautions: Fall Precaution Comments: low fall Restrictions Weight Bearing Restrictions: No     Mobility  Bed Mobility Overal bed mobility: Independent                  Transfers Overall transfer level: Independent                      Ambulation/Gait Ambulation/Gait assistance:  Independent Gait Distance (Feet): 2000 Feet Assistive device: None Gait Pattern/deviations: Step-through pattern, WFL(Within Functional Limits) Gait velocity: WFL Gait velocity interpretation: >4.37 ft/sec, indicative of normal walking speed   General Gait Details: pt's gait is WFL, able to perform higher level balance without assist and increase gait speed      Modified Rankin (Stroke Patients Only) Modified Rankin (Stroke Patients Only) Pre-Morbid Rankin Score: No symptoms Modified Rankin: No significant disability     Balance Overall balance assessment: No apparent balance deficits (not formally assessed)                                          Cognition Arousal/Alertness: Awake/alert Behavior During Therapy: WFL for tasks assessed/performed Overall Cognitive Status: Within Functional Limits for tasks assessed                                             General Comments General comments (skin integrity, edema, etc.): VSS on RA      Pertinent Vitals/Pain Pain Assessment Pain Assessment: No/denies pain     PT Goals (current goals can now be found in the care plan section) Acute Rehab PT Goals Patient Stated Goal: go home PT Goal Formulation: With patient Time For Goal Achievement: 01/25/22 Potential to Achieve Goals: Good Progress towards PT goals: Progressing toward goals    Frequency    Min  3X/week      PT Plan Current plan remains appropriate       AM-PAC PT "6 Clicks" Mobility   Outcome Measure  Help needed turning from your back to your side while in a flat bed without using bedrails?: None Help needed moving from lying on your back to sitting on the side of a flat bed without using bedrails?: None Help needed moving to and from a bed to a chair (including a wheelchair)?: None Help needed standing up from a chair using your arms (e.g., wheelchair or bedside chair)?: None Help needed to walk in hospital room?:  None Help needed climbing 3-5 steps with a railing? : None 6 Click Score: 24    End of Session Equipment Utilized During Treatment: Gait belt Activity Tolerance: Patient tolerated treatment well Patient left: in bed;with call bell/phone within reach (sitting EoB) Nurse Communication: Mobility status PT Visit Diagnosis: Hemiplegia and hemiparesis Hemiplegia - Right/Left: Left Hemiplegia - dominant/non-dominant: Non-dominant Hemiplegia - caused by: Cerebral infarction     Time: 0177-9390 PT Time Calculation (min) (ACUTE ONLY): 15 min  Charges:  $Therapeutic Exercise: 8-22 mins                     Tabbatha Bordelon B. Migdalia Dk PT, DPT Acute Rehabilitation Services Pager [phone] Office [phone]    [place] 01/12/2022, 10:05 AM

## 2022-01-13 ENCOUNTER — Other Ambulatory Visit: Payer: Self-pay | Admitting: Radiology

## 2022-01-13 DIAGNOSIS — I639 Cerebral infarction, unspecified: Secondary | ICD-10-CM

## 2022-01-13 LAB — CBC
HCT: 37.4 % — ABNORMAL LOW (ref 39.0–52.0)
Hemoglobin: 11.8 g/dL — ABNORMAL LOW (ref 13.0–17.0)
MCH: 29.9 pg (ref 26.0–34.0)
MCHC: 31.6 g/dL (ref 30.0–36.0)
MCV: 94.9 fL (ref 80.0–100.0)
Platelets: 198 10*3/uL (ref 150–400)
RBC: 3.94 MIL/uL — ABNORMAL LOW (ref 4.22–5.81)
RDW: 15.2 % (ref 11.5–15.5)
WBC: 49.5 10*3/uL — ABNORMAL HIGH (ref 4.0–10.5)
nRBC: 0 % (ref 0.0–0.2)

## 2022-01-13 LAB — BASIC METABOLIC PANEL
Anion gap: 8 (ref 5–15)
BUN: 19 mg/dL (ref 8–23)
CO2: 22 mmol/L (ref 22–32)
Calcium: 8.9 mg/dL (ref 8.9–10.3)
Chloride: 107 mmol/L (ref 98–111)
Creatinine, Ser: 1.33 mg/dL — ABNORMAL HIGH (ref 0.61–1.24)
GFR, Estimated: 56 mL/min — ABNORMAL LOW (ref >60)
Glucose, Bld: 92 mg/dL (ref 70–99)
Potassium: 4.1 mmol/L (ref 3.5–5.1)
Sodium: 137 mmol/L (ref 135–145)

## 2022-01-13 MED ORDER — TICAGRELOR 90 MG PO TABS: 90.0000 mg | ORAL_TABLET | Freq: Two times a day (BID) | ORAL | 3 refills | Status: DC

## 2022-01-13 MED ORDER — ATORVASTATIN CALCIUM 80 MG PO TABS: 80.0000 mg | ORAL_TABLET | Freq: Every day | ORAL | 1 refills | Status: AC

## 2022-01-13 MED ORDER — ASPIRIN 81 MG PO CHEW: 81.0000 mg | CHEWABLE_TABLET | Freq: Every day | ORAL | 0 refills | Status: AC

## 2022-01-13 NOTE — Progress Notes (Signed)
Referring Physician(s): Dr. Erlinda Hong  Supervising Physician: [name]  Patient Status:  [facility] - In-pt  Chief Complaint:  Code stroke s/p cerebral angiogram with angioplasty and carotid stenting on 2.9.23 with Dr. Raliegh Ip. de Sindy Messing   Subjective:   Patient states that he is feeling better. Sitting up in re  Allergies: Patient has no known allergies.  Medications: Prior to Admission medications   Medication Sig Start Date End Date Taking? Authorizing Provider  acetaminophen (TYLENOL) 500 MG tablet Take 500 mg by mouth every 6 (six) hours as needed.   Yes [provider]  lisinopril (ZESTRIL) 10 MG tablet Take 5 mg by mouth daily.   Yes [provider]  amoxicillin-clavulanate (AUGMENTIN) 875-125 MG tablet TAKE 1 TABLET BY MOUTH 2 (TWO) TIMES DAILY. Patient not taking: Reported on 01/09/2022 12/07/15   Claretta Fraise, MD  fluticasone [facility]) 50 MCG/ACT nasal spray Place 2 sprays into both nostrils daily. Patient not taking: Reported on 01/09/2022 11/19/15   Sharion Balloon, FNP     Vital Signs: BP 118/63    Pulse (!) 58    Temp 98.1 F (36.7 C) (Oral)    Resp 20    Ht _0  (1.753 m)    Wt 159 lb 13.3 oz (72.5 kg)    SpO2 97%    BMI 23.60 kg/m   Physical Exam Vitals and nursing note reviewed.  Constitutional:      Appearance: He is well-developed.  HENT:     Head: Normocephalic.  Cardiovascular:     Comments: Site is soft with no active bleeding and no appreciable pseudoaneurysm. Dressing is C/D/I  Pulmonary:     Effort: Pulmonary effort is normal.  Musculoskeletal:        General: Normal range of motion.     Cervical back: Normal range of motion.  Skin:    General: Skin is dry.  Neurological:     Mental Status: He is alert and oriented to person, place, and time.     Comments: Alert, aware and oriented X 3 Speech and comprehension is intact.  PERRL bilaterally Mild left sided facial droop noted Tongue midline  Speech,  cognition and language  are generally intact.  Comprehension and fluency are normal.  Judgment and insight norma   Gait not assessed Romberg not assessed Heel to toe not assessed Distal pulses not assessed     Imaging: CT ANGIO HEAD NECK W WO CM  Result Date: 01/09/2022 CLINICAL DATA:  Neuro deficit, acute, stroke suspected EXAM: CT ANGIOGRAPHY HEAD AND NECK TECHNIQUE: Multidetector CT imaging of the head and neck was performed using the standard protocol during bolus administration of intravenous contrast. Multiplanar CT image reconstructions and MIPs were obtained to evaluate the vascular anatomy. Carotid stenosis measurements (when applicable) are obtained utilizing NASCET criteria, using the distal internal carotid diameter as the denominator. RADIATION DOSE REDUCTION: This exam was performed according to the departmental dose-optimization program which includes automated exposure control, adjustment of the mA and/or kV according to patient size and/or use of iterative reconstruction technique. CONTRAST:  50m OMNIPAQUE IOHEXOL 350 MG/ML SOLN COMPARISON:  None. FINDINGS: CT HEAD FINDINGS Brain: Patchy hypoattenuation in the right frontal white matter and bilateral basal ganglia. No acute hemorrhage, mass lesion, midline shift, hydrocephalus or visible extra-axial fluid collection. Mild atrophy. Vascular: See below. Skull: No acute fracture. Sinuses: Small right maxillary sinus with mild mucosal thickening. Otherwise, clear sinuses. Orbits: No acute finding. Review of the MIP images confirms the  above findings CTA NECK FINDINGS Aortic arch: Great vessel origins are patent. Right carotid system: Mixed calcific and noncalcific atherosclerosis at the carotid bifurcation. 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 The ICA remains non-opacified in the neck. Left carotid system: Mixed calcific and noncalcific atherosclerosis at the carotid bifurcation and involving the proximal ICA. Approximately 70-80% stenosis  of the proximal ICA. Vertebral arteries: Co dominant. Severe right vertebral artery origin stenosis. Otherwise, vertebral arteries are patent without significant stenosis. Skeleton: No evidence of acute abnormality on limited assessment. Other neck: Increased number of lymph nodes in the neck bilaterally and in the visualized upper mediastinum with enlarged bilateral submandibular and upper cervical chain nodes. Left level 2 node measures up to 1.5 cm short axis and is rounded. Submandibular nodes measure up to approximately 1.3 cm short axis on the right. Upper chest: Visualized lung apices are clear. Review of the MIP images confirms the above findings CTA HEAD FINDINGS Anterior circulation: Reconstitution of the right ICA at the proximal petrous segment. Bilateral intracranial ICA atherosclerosis with moderate to severe bilateral paraclinoid ICA stenosis. Bilateral MCAs are patent with multifocal mild-to-moderate M1 and M2 MCA stenosis. Small right A1 ACA, probably congenital given prominent left A1 ACA. Otherwise, ACAs are patent without proximal high-grade stenosis Posterior circulation: Bilateral intradural vertebral arteries are patent. Severe stenosis of the distal right intradural vertebral artery. Moderate stenosis of the distal left intradural vertebral artery. Basilar artery is patent with multifocal mild stenosis. Multifocal severe left P1 and P2 PCA stenosis. Moderate right P1 PCA stenosis. Venous sinuses: As permitted by contrast timing, patent. Review of the MIP images confirms the above findings IMPRESSION: CT head: 1. Patchy hypoattenuation in the right frontal white matter and bilateral basal ganglia, which could represent chronic microvascular ischemic disease versus age indeterminate infarcts in the absence of priors. MRI could provide more sensitive evaluation for acute infarct. 2. No evidence of acute hemorrhage. CTA: 1. Right ICA origin occlusion in the neck with non opacification of the  remainder of the neck and reconstitution at the level of the proximal petrous ICA. 2. Moderate to severe bilateral paraclinoid ICA stenosis. 3. Multifocal severe left P1 and P2 PCA stenosis. Moderate right P1 PCA stenosis. 4. Severe right vertebral artery origin stenosis. Also, severe right and moderate left distal intradural vertebral artery stenosis. 5. Approximately 70-80% stenosis of the proximal left ICA in the neck. 6. Multifocal mild-to-moderate bilateral M1 and M2 MCA stenosis 7. Increased number of lymph nodes in the neck bilaterally and in the visualized upper mediastinum with enlarged and somewhat rounded submandibular and upper cervical chain nodes. While nonspecific, findings raise concern for underlying lymphoproliferative disorder. Consider follow-up CT neck and possibly CT chest/abdomen/pelvis for further evaluation. Findings discussed with Ileene Patrick PA via telephone at 7:16 PM Electronically Signed   By: Margaretha Sheffield M.D.   On: 01/09/2022 19:19   MR BRAIN WO CONTRAST  Result Date: 01/10/2022 CLINICAL DATA:  Neuro deficit, acute, stroke suspected EXAM: MRI HEAD WITHOUT CONTRAST TECHNIQUE: Multiplanar, multiecho pulse sequences of the brain and surrounding structures were obtained without intravenous contrast. COMPARISON:  None. FINDINGS: Brain: There is cortical/subcortical reduced diffusion in the right frontal lobe including involvement of lateral precentral gyrus. Patchy and confluent areas of T2 hyperintensity in the supratentorial white matter nonspecific but probably reflect mild to moderate chronic microvascular ischemic changes. There are chronic right frontoparietal cortical infarcts. Small chronic infarct of the right frontal subcortical white matter. Prominent perivascular spaces and probable superimposed chronic small vessel infarcts of  the central gray nuclei and white matter. Chronic blood products along the right basal ganglia and adjacent white matter. No intracranial mass or  mass effect. There is no hydrocephalus or extra-axial fluid collection. Prominence of the ventricles and sulci reflects mild parenchymal volume loss. Vascular: Diminished right ICA flow void. Skull and upper cervical spine: Marrow signal is mildly heterogeneous but otherwise unremarkable. Sinuses/Orbits: Mild mucosal thickening.  Orbits are unremarkable. Other: Sella is unremarkable.  Mastoid air cells are clear. IMPRESSION: Small acute cortical/subcortical right frontal infarcts with involvement of lateral precentral gyrus. 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 Electronically Signed   By: Macy Mis M.D.   On: 01/10/2022 09:41   IR INTRAVSC STENT CERV CAROTID W/EMB-PROT MOD SED  Result Date: 01/13/2022 INDICATION: Daniel Osborne is a 76 year old male with a past medical history significant for hypertension and chronic leukemia who presented to the [facility] ED on Monday with new onset left sided facial droop, drooling and pocketing of food in the left side of his mouth while eating. CT angiogram of the head and neck showed right ICA occlusion with intracranial reconstitution and left ICA severe stenosis. MRI of the brain was positive for small right MCA territory infarcts. He comes to our service today for a diagnostic cerebral angiogram to confirm CT angiogram findings with possible left carotid stenting in case right ICA chronic occlusion is confirmed. EXAM: ULTRASOUND-GUIDED VASCULAR ACCESS DIAGNOSTIC CEREBRAL ANGIOGRAM LEFT CAROTID STENTING WITH CEREBRAL PROTECTION DEVICE COMPARISON:  CT/CT angiogram of the head and neck January 09, 2022. MEDICATIONS: 5000 units of heparin intravenously ANESTHESIA/SEDATION: The procedure was performed under monitored anesthesia care (MAC). CONTRAST:  150 mL Omnipaque 300 milligram/mL FLUOROSCOPY: Radiation Exposure Index (as provided by the fluoroscopic device): 644.0 mGy Kerma COMPLICATIONS: None immediate. TECHNIQUE: Informed written consent  was obtained from the patient after a thorough discussion of the procedural risks, benefits and alternatives. All questions were addressed. Maximal Sterile Barrier Technique was utilized including caps, mask, sterile gowns, sterile gloves, sterile drape, hand hygiene and skin antiseptic. A timeout was performed prior to the initiation of the procedure. The right groin was prepped and draped in the usual sterile fashion. The right groin region was infiltrated with lidocaine 1%. Using a micropuncture kit and the modified Seldinger technique, access was gained to the right common femoral artery and 5 French sheath was placed. Real-time ultrasound guidance was utilized for vascular access including the acquisition of a permanent ultrasound image documenting patency of the accessed vessel. Under fluoroscopy, a 4 [place] Berenstein 2 catheter was navigated over a 0.035" Terumo Glidewire into the aortic arch. The catheter was placed into the right common carotid artery. Frontal, lateral and bilateral oblique angiograms of the neck were obtained followed by frontal and lateral angiograms of the head. The catheter was then placed into the left subclavian artery. Frontal angiograms of the neck were obtained. The catheter was then advanced into the left vertebral artery. Frontal and lateral angiograms of the head were obtained. The catheter was then navigated into the left common carotid artery. Frontal and lateral angiograms of the neck were obtained. However, the catheter retracted backed into the aortic arch during injection due to tortuosity. The catheter was then exchanged over the wire and under fluoroscopy for a 5 French Simmons 2 glide catheter. The catheter tip was reformed in the aortic arch and placed into the left common carotid artery. Frontal, lateral and bilateral oblique angiograms of the neck were obtained followed by frontal and lateral angiograms of  the head. FINDINGS: 1. Right common femoral artery  ultrasound: Normal caliber of the right common femoral artery, adequate for vascular access. 2. 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 Anastomosis of the left middle meningeal artery to left anterior cerebral artery in the parietal region is also noted. 3. Left subclavian artery angiogram: Increased tortuosity of the left subclavian artery without hemodynamically significant stenosis. Mild atherosclerotic changes at the proximal right vertebral artery without hemodynamically significant stenosis. 4. Left vertebral artery angiograms: Atherosclerotic changes at the distal left vertebral artery resulting in approximately 60% stenosis near the vertebrobasilar junction. Atherosclerotic changes of the basilar and bilateral posterior cerebral arteries. Degree of stenosis is difficult to assess due to patient's motion. Collateral circulation from the right PCA to the right ACA via posterior pericallosal arcade and to the ACA and MCA territory via leptomeningeal collaterals and diminutive PCOMs. 5. Left common carotid artery angiograms: Increased tortuosity of the proximal left common carotid artery. Atherosclerotic changes in the left carotid bifurcation resulting in approximately 65% stenosis. Contrast opacification of the left MCA and ACA vascular tree seen with faint contrast opacification of the right MCA and ACA vascular tree via anterior communicating artery. Atherosclerotic changes of the left carotid siphon, left M1-M2/MCA segments and left A2-A3/ACA segment without hemodynamically significant stenosis. PROCEDURE: Frontal and lateral angiograms of the neck were obtained and utilized as biplane roadmap. The 5 [place] Sim 2 glide catheter was advanced into the left  external carotid artery. Under fluoroscopy, the catheter and the femoral sheath were removed over a 0.035 inch Terumo Glidewire. Then, a 6 [place] shuttle sheath was advanced into the distal left common carotid artery. Frontal and lateral angiograms of the neck were obtained and utilized as biplane roadmap. Next, a 2.5-4.8 mm Emboshield NAV 6 cerebral protection device was navigated into the distal cervical segment of the left ICA. Then, a 8-6 x 40 mm XACT carotid stent was deployed across the left carotid bifurcation, covering the area of stenosis. Subsequently, a 5 x 30 mm Viatrac balloon was navigated into the recently deployed stent. Angioplasty was performed under fluoroscopy. Patient developed transient bradycardia rapidly resolved with balloon deflation. The cerebral protection device was then recaptured. Frontal and lateral angiograms of the neck showed adequate stent position with resolution of stenosis. Angiograms with frontal and lateral views of the head showed no evidence of thromboembolic complication and modest improvement of the right MCA and ACA perfusion via anterior communicating artery. Delayed angiograms with frontal and lateral views of the neck showed no evidence of in stent clot formation. The shuttle sheath was then retracted into the right common femoral artery. Angiograms were obtained with right anterior oblique and lateral views. The puncture is at the level of the mid right common femoral artery. The artery has normal caliber, adequate for closure device. The shuttle sheath was exchanged over the wire for a Perclose prostyle which was utilized for access closure. Immediate hemostasis was achieved. IMPRESSION: 1. Successful left carotid bifurcation stenting and angioplasty with cerebral protection device. No evidence of thromboembolic complication. 2. 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 Right  anterior circulation is also supplied by posterior circulation via posterior pericallosal arcade, diminutive posterior communicating artery and leptomeningeal collaterals. 3. Moderate stenosis of  the left vertebral artery at the vertebrobasilar junction. PLAN: 1. Patient to continue on dual anti-platelet therapy with Brilinta 90 mg b.i.d. and aspirin 81 mg q.d. 2. A carotid duplex will be obtained at three-month postprocedure and dual anti-platelet regimen will be re-evaluated at this point. Electronically Signed   By: [name] M.D.   On: 01/13/2022 09:47   IR US Guide Vasc Access Right  Result Date: 01/13/2022 INDICATION: Daniel Osborne is a 76 year old male with a past medical history significant for hypertension and chronic leukemia who presented to the [facility] ED on Monday with new onset left sided facial droop, drooling and pocketing of food in the left side of his mouth while eating. CT angiogram of the head and neck showed right ICA occlusion with intracranial reconstitution and left ICA severe stenosis. MRI of the brain was positive for small right MCA territory infarcts. He comes to our service today for a diagnostic cerebral angiogram to confirm CT angiogram findings with possible left carotid stenting in case right ICA chronic occlusion is confirmed. EXAM: ULTRASOUND-GUIDED VASCULAR ACCESS DIAGNOSTIC CEREBRAL ANGIOGRAM LEFT CAROTID STENTING WITH CEREBRAL PROTECTION DEVICE COMPARISON:  CT/CT angiogram of the head and neck January 09, 2022. MEDICATIONS: 5000 units of heparin intravenously ANESTHESIA/SEDATION: The procedure was performed under monitored anesthesia care (MAC). CONTRAST:  150 mL Omnipaque 300 milligram/mL FLUOROSCOPY: Radiation Exposure Index (as provided by the fluoroscopic device): 469.6 mGy Kerma COMPLICATIONS: None immediate. TECHNIQUE: Informed written consent was obtained from the patient after a thorough discussion of the procedural risks, benefits and  alternatives. All questions were addressed. Maximal Sterile Barrier Technique was utilized including caps, mask, sterile gowns, sterile gloves, sterile drape, hand hygiene and skin antiseptic. A timeout was performed prior to the initiation of the procedure. The right groin was prepped and draped in the usual sterile fashion. The right groin region was infiltrated with lidocaine 1%. Using a micropuncture kit and the modified Seldinger technique, access was gained to the right common femoral artery and 5 French sheath was placed. Real-time ultrasound guidance was utilized for vascular access including the acquisition of a permanent ultrasound image documenting patency of the accessed vessel. Under fluoroscopy, a 4 [place] Berenstein 2 catheter was navigated over a 0.035" Terumo Glidewire into the aortic arch. The catheter was placed into the right common carotid artery. Frontal, lateral and bilateral oblique angiograms of the neck were obtained followed by frontal and lateral angiograms of the head. The catheter was then placed into the left subclavian artery. Frontal angiograms of the neck were obtained. The catheter was then advanced into the left vertebral artery. Frontal and lateral angiograms of the head were obtained. The catheter was then navigated into the left common carotid artery. Frontal and lateral angiograms of the neck were obtained. However, the catheter retracted backed into the aortic arch during injection due to tortuosity. The catheter was then exchanged over the wire and under fluoroscopy for a 5 French Simmons 2 glide catheter. The catheter tip was reformed in the aortic arch and placed into the left common carotid artery. Frontal, lateral and bilateral oblique angiograms of the neck were obtained followed by frontal and lateral angiograms of the head. FINDINGS: 1. Right common femoral artery ultrasound: Normal caliber of the right common femoral artery, adequate for vascular access. 2. 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 Anastomosis of the left middle meningeal artery to left anterior cerebral artery in the parietal region is also noted. 3. Left subclavian artery angiogram: Increased tortuosity of the left subclavian artery without hemodynamically significant stenosis. Mild atherosclerotic changes at the proximal right vertebral artery without hemodynamically significant stenosis. 4. Left vertebral artery angiograms: Atherosclerotic changes at the distal left vertebral artery resulting in approximately 60% stenosis near the vertebrobasilar junction. Atherosclerotic changes of the basilar and bilateral posterior cerebral arteries. Degree of stenosis is difficult to assess due to patient's motion. Collateral circulation from the right PCA to the right ACA via posterior pericallosal arcade and to the ACA and MCA territory via leptomeningeal collaterals and diminutive PCOMs. 5. Left common carotid artery angiograms: Increased tortuosity of the proximal left common carotid artery. Atherosclerotic changes in the left carotid bifurcation resulting in approximately 65% stenosis. Contrast opacification of the left MCA and ACA vascular tree seen with faint contrast opacification of the right MCA and ACA vascular tree via anterior communicating artery. Atherosclerotic changes of the left carotid siphon, left M1-M2/MCA segments and left A2-A3/ACA segment without hemodynamically significant stenosis. PROCEDURE: Frontal and lateral angiograms of the neck were obtained and utilized as biplane roadmap. The 5 [place] Sim 2 glide catheter was advanced into the left external carotid artery. Under fluoroscopy, the catheter and the femoral sheath were removed over a 0.035  inch Terumo Glidewire. Then, a 6 [place] shuttle sheath was advanced into the distal left common carotid artery. Frontal and lateral angiograms of the neck were obtained and utilized as biplane roadmap. Next, a 2.5-4.8 mm Emboshield NAV 6 cerebral protection device was navigated into the distal cervical segment of the left ICA. Then, a 8-6 x 40 mm XACT carotid stent was deployed across the left carotid bifurcation, covering the area of stenosis. Subsequently, a 5 x 30 mm Viatrac balloon was navigated into the recently deployed stent. Angioplasty was performed under fluoroscopy. Patient developed transient bradycardia rapidly resolved with balloon deflation. The cerebral protection device was then recaptured. Frontal and lateral angiograms of the neck showed adequate stent position with resolution of stenosis. Angiograms with frontal and lateral views of the head showed no evidence of thromboembolic complication and modest improvement of the right MCA and ACA perfusion via anterior communicating artery. Delayed angiograms with frontal and lateral views of the neck showed no evidence of in stent clot formation. The shuttle sheath was then retracted into the right common femoral artery. Angiograms were obtained with right anterior oblique and lateral views. The puncture is at the level of the mid right common femoral artery. The artery has normal caliber, adequate for closure device. The shuttle sheath was exchanged over the wire for a Perclose prostyle which was utilized for access closure. Immediate hemostasis was achieved. IMPRESSION: 1. Successful left carotid bifurcation stenting and angioplasty with cerebral protection device. No evidence of thromboembolic complication. 2. 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 Right anterior circulation is also supplied by posterior circulation via posterior pericallosal arcade, diminutive  posterior communicating artery and leptomeningeal collaterals. 3. 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 PLAN: 1. Patient to continue on dual anti-platelet therapy with Brilinta 90 mg b.i.d. and aspirin 81 mg q.d. 2. A carotid duplex will be obtained at three-month postprocedure and dual anti-platelet regimen will be re-evaluated at this point. Electronically Signed   By: Roylene Reason  Sindy Messing M.D.   On: 01/13/2022 09:47   ECHOCARDIOGRAM COMPLETE  Result Date: 01/10/2022    ECHOCARDIOGRAM REPORT   Patient Name:   Daniel Osborne Date of Exam: 01/10/2022 Medical Rec #:  [id number]       Height:       69.0 in Accession #:    [id number]      Weight:       170.2 lb Date of Birth:  1946-01-05       BSA:          1.929 m Patient Age:    76 years        BP:           141/84 mmHg Patient Gender: M               HR:           81 bpm. Exam Location:  [facility] Procedure: 2D Echo, Cardiac Doppler and Color Doppler Indications:    Stroke I63.9  History:        Patient has no prior history of Echocardiogram examinations.                 Risk Factors:Hypertension. CVA (cerebral vascular accident)                 ([place]).  Sonographer:    Alvino Chapel RCS Referring Phys: [id number] ASIA B [place]  1. Left ventricular ejection fraction, by estimation, is 60 to 65%. The left ventricle has normal function. The left ventricle has no regional wall motion abnormalities. Left ventricular diastolic parameters are consistent with Grade I diastolic dysfunction (impaired relaxation).  2. Right ventricular systolic function is normal. The right ventricular size is normal. Tricuspid regurgitation signal is inadequate for assessing PA pressure.  3. The mitral valve is degenerative. Trivial mitral valve regurgitation. No evidence of mitral stenosis.  4. The aortic valve is tricuspid. Aortic valve regurgitation is trivial. Aortic valve sclerosis is present, with no evidence of aortic  valve stenosis.  5. The inferior vena cava is normal in size with greater than 50% respiratory variability, suggesting right atrial pressure of 3 mmHg. Comparison(s): No prior Echocardiogram. FINDINGS  Left Ventricle: Left ventricular ejection fraction, by estimation, is 60 to 65%. The left ventricle has normal function. The left ventricle has no regional wall motion abnormalities. The left ventricular internal cavity size was normal in size. There is  no left ventricular hypertrophy. Left ventricular diastolic parameters are consistent with Grade I diastolic dysfunction (impaired relaxation). Right Ventricle: The right ventricular size is normal. No increase in right ventricular wall thickness. Right ventricular systolic function is normal. Tricuspid regurgitation signal is inadequate for assessing PA pressure. Left Atrium: Left atrial size was normal in size. Right Atrium: Right atrial size was normal in size. Pericardium: There is no evidence of pericardial effusion. Mitral Valve: The mitral valve is degenerative in appearance. Trivial mitral valve regurgitation. No evidence of mitral valve stenosis. Tricuspid Valve: The tricuspid valve is normal in structure. Tricuspid valve regurgitation is trivial. No evidence of tricuspid stenosis. Aortic Valve: The aortic valve is tricuspid. Aortic valve regurgitation is trivial. Aortic valve sclerosis is present, with no evidence of aortic valve stenosis. Aortic valve mean gradient measures 12.0 mmHg. Aortic valve peak gradient measures 20.6 mmHg. Aortic valve area, by VTI measures 1.42 cm. Pulmonic Valve: The pulmonic valve was not well visualized. Pulmonic valve regurgitation is not visualized. No evidence of pulmonic stenosis. Aorta: The aortic root is normal  in size and structure. Venous: The inferior vena cava is normal in size with greater than 50% respiratory variability, suggesting right atrial pressure of 3 mmHg. IAS/Shunts: No atrial level shunt detected by color  flow Doppler.  LEFT VENTRICLE PLAX 2D LVIDd:         4.20 cm   Diastology LVIDs:         2.80 cm   LV e' medial:    7.94 cm/s LV PW:         1.00 cm   LV E/e' medial:  11.6 LV IVS:        0.90 cm   LV e' lateral:   5.98 cm/s LVOT diam:     1.90 cm   LV E/e' lateral: 15.5 LV SV:         67 LV SV Index:   35 LVOT Area:     2.84 cm  RIGHT VENTRICLE RV S prime:     12.10 cm/s TAPSE (M-mode): 2.1 cm LEFT ATRIUM             Index        RIGHT ATRIUM           Index LA diam:        3.60 cm 1.87 cm/m   RA Area:     15.70 cm LA Vol (A2C):   62.3 ml 32.30 ml/m  RA Volume:   40.20 ml  20.84 ml/m LA Vol (A4C):   45.2 ml 23.43 ml/m LA Biplane Vol: 55.0 ml 28.51 ml/m  AORTIC VALVE AV Area (Vmax):    1.44 cm AV Area (Vmean):   1.28 cm AV Area (VTI):     1.42 cm AV Vmax:           227.00 cm/s AV Vmean:          160.000 cm/s AV VTI:            0.470 m AV Peak Grad:      20.6 mmHg AV Mean Grad:      12.0 mmHg LVOT Vmax:         115.00 cm/s LVOT Vmean:        72.000 cm/s LVOT VTI:          0.235 m LVOT/AV VTI ratio: 0.50  AORTA Ao Root diam: 3.10 cm MITRAL VALVE MV Area (PHT): 3.37 cm     SHUNTS MV Decel Time: 225 msec     Systemic VTI:  0.24 m MV E velocity: 92.50 cm/s   Systemic Diam: 1.90 cm MV A velocity: 111.00 cm/s MV E/A ratio:  0.83 Rudean Haskell MD Electronically signed by Rudean Haskell MD Signature Date/Time: 01/10/2022/12:08:44 PM    Final    IR ANGIO EXTRACRAN SEL COM CAROTID INNOMINATE UNI BILAT MOD SED  Result Date: 01/13/2022 INDICATION: AMARRI SATTERLY is a 76 year old male with a past medical history significant for hypertension and chronic leukemia who presented to the [facility] ED on Monday with new onset left sided facial droop, drooling and pocketing of food in the left side of his mouth while eating. CT angiogram of the head and neck showed right ICA occlusion with intracranial reconstitution and left ICA severe stenosis. MRI of the brain was positive for small right MCA territory  infarcts. He comes to our service today for a diagnostic cerebral angiogram to confirm CT angiogram findings with possible left carotid stenting in case right ICA chronic occlusion is confirmed. EXAM: ULTRASOUND-GUIDED VASCULAR ACCESS DIAGNOSTIC CEREBRAL ANGIOGRAM LEFT  CAROTID STENTING WITH CEREBRAL PROTECTION DEVICE COMPARISON:  CT/CT angiogram of the head and neck January 09, 2022. MEDICATIONS: 5000 units of heparin intravenously ANESTHESIA/SEDATION: The procedure was performed under monitored anesthesia care (MAC). CONTRAST:  150 mL Omnipaque 300 milligram/mL FLUOROSCOPY: Radiation Exposure Index (as provided by the fluoroscopic device): 852.7 mGy Kerma COMPLICATIONS: None immediate. TECHNIQUE: Informed written consent was obtained from the patient after a thorough discussion of the procedural risks, benefits and alternatives. All questions were addressed. Maximal Sterile Barrier Technique was utilized including caps, mask, sterile gowns, sterile gloves, sterile drape, hand hygiene and skin antiseptic. A timeout was performed prior to the initiation of the procedure. The right groin was prepped and draped in the usual sterile fashion. The right groin region was infiltrated with lidocaine 1%. Using a micropuncture kit and the modified Seldinger technique, access was gained to the right common femoral artery and 5 French sheath was placed. Real-time ultrasound guidance was utilized for vascular access including the acquisition of a permanent ultrasound image documenting patency of the accessed vessel. Under fluoroscopy, a 4 [place] Berenstein 2 catheter was navigated over a 0.035" Terumo Glidewire into the aortic arch. The catheter was placed into the right common carotid artery. Frontal, lateral and bilateral oblique angiograms of the neck were obtained followed by frontal and lateral angiograms of the head. The catheter was then placed into the left subclavian artery. Frontal angiograms of the neck were obtained.  The catheter was then advanced into the left vertebral artery. Frontal and lateral angiograms of the head were obtained. The catheter was then navigated into the left common carotid artery. Frontal and lateral angiograms of the neck were obtained. However, the catheter retracted backed into the aortic arch during injection due to tortuosity. The catheter was then exchanged over the wire and under fluoroscopy for a 5 French Simmons 2 glide catheter. The catheter tip was reformed in the aortic arch and placed into the left common carotid artery. Frontal, lateral and bilateral oblique angiograms of the neck were obtained followed by frontal and lateral angiograms of the head. FINDINGS: 1. Right common femoral artery ultrasound: Normal caliber of the right common femoral artery, adequate for vascular access. 2. 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 Anastomosis of the left middle meningeal artery to left anterior cerebral artery in the parietal region is also noted. 3. Left subclavian artery angiogram: Increased tortuosity of the left subclavian artery without hemodynamically significant stenosis. Mild atherosclerotic changes at the proximal right vertebral artery without hemodynamically significant stenosis. 4. Left vertebral artery angiograms: Atherosclerotic changes at the distal left vertebral artery resulting in approximately 60% stenosis near the vertebrobasilar junction. Atherosclerotic changes of the basilar and bilateral posterior cerebral arteries. Degree of stenosis is difficult to assess due to patient's motion. Collateral circulation from the right PCA to the right ACA via posterior pericallosal arcade and to the ACA and MCA territory via leptomeningeal  collaterals and diminutive PCOMs. 5. Left common carotid artery angiograms: Increased tortuosity of the proximal left common carotid artery. Atherosclerotic changes in the left carotid bifurcation resulting in approximately 65% stenosis. Contrast opacification of the left MCA and ACA vascular tree seen with faint contrast opacification of the right MCA and ACA vascular tree via anterior communicating artery. Atherosclerotic changes of the left carotid siphon, left M1-M2/MCA  segments and left A2-A3/ACA segment without hemodynamically significant stenosis. PROCEDURE: Frontal and lateral angiograms of the neck were obtained and utilized as biplane roadmap. The 5 [place] Sim 2 glide catheter was advanced into the left external carotid artery. Under fluoroscopy, the catheter and the femoral sheath were removed over a 0.035 inch Terumo Glidewire. Then, a 6 [place] shuttle sheath was advanced into the distal left common carotid artery. Frontal and lateral angiograms of the neck were obtained and utilized as biplane roadmap. Next, a 2.5-4.8 mm Emboshield NAV 6 cerebral protection device was navigated into the distal cervical segment of the left ICA. Then, a 8-6 x 40 mm XACT carotid stent was deployed across the left carotid bifurcation, covering the area of stenosis. Subsequently, a 5 x 30 mm Viatrac balloon was navigated into the recently deployed stent. Angioplasty was performed under fluoroscopy. Patient developed transient bradycardia rapidly resolved with balloon deflation. The cerebral protection device was then recaptured. Frontal and lateral angiograms of the neck showed adequate stent position with resolution of stenosis. Angiograms with frontal and lateral views of the head showed no evidence of thromboembolic complication and modest improvement of the right MCA and ACA perfusion via anterior communicating artery. Delayed angiograms with frontal and lateral views of the neck showed no evidence of in stent clot  formation. The shuttle sheath was then retracted into the right common femoral artery. Angiograms were obtained with right anterior oblique and lateral views. The puncture is at the level of the mid right common femoral artery. The artery has normal caliber, adequate for closure device. The shuttle sheath was exchanged over the wire for a Perclose prostyle which was utilized for access closure. Immediate hemostasis was achieved. IMPRESSION: 1. Successful left carotid bifurcation stenting and angioplasty with cerebral protection device. No evidence of thromboembolic complication. 2. 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 Right anterior circulation is also supplied by posterior circulation via posterior pericallosal arcade, diminutive posterior communicating artery and leptomeningeal collaterals. 3. 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 PLAN: 1. Patient to continue on dual anti-platelet therapy with Brilinta 90 mg b.i.d. and aspirin 81 mg q.d. 2. A carotid duplex will be obtained at three-month postprocedure and dual anti-platelet regimen will be re-evaluated at this point. Electronically Signed   By: [name] M.D.   On: 01/13/2022 09:47   IR ANGIO VERTEBRAL SEL VERTEBRAL UNI L MOD SED  Result Date: 01/13/2022 INDICATION: Daniel Osborne is a 76 year old male with a past medical history significant for hypertension and chronic leukemia who presented to the [facility] ED on Monday with new onset left sided facial droop, drooling and pocketing of food in the left side of his mouth while eating. CT angiogram of the head and neck showed right ICA occlusion with intracranial reconstitution and left ICA severe stenosis. MRI of the brain was positive for small right MCA territory infarcts. He comes to our service today for a diagnostic cerebral angiogram to confirm CT  angiogram findings with possible left carotid stenting in case right ICA chronic occlusion is confirmed. EXAM: ULTRASOUND-GUIDED VASCULAR ACCESS DIAGNOSTIC CEREBRAL ANGIOGRAM LEFT CAROTID STENTING WITH CEREBRAL PROTECTION DEVICE COMPARISON:  CT/CT angiogram of the head and neck January 09, 2022. MEDICATIONS: 5000 units of heparin intravenously ANESTHESIA/SEDATION: The procedure was performed under monitored anesthesia care (MAC). CONTRAST:  150 mL Omnipaque 300 milligram/mL FLUOROSCOPY: Radiation Exposure Index (as provided by the fluoroscopic device): 546.9  mGy Kerma COMPLICATIONS: None immediate. TECHNIQUE: Informed written consent was obtained from the patient after a thorough discussion of the procedural risks, benefits and alternatives. All questions were addressed. Maximal Sterile Barrier Technique was utilized including caps, mask, sterile gowns, sterile gloves, sterile drape, hand hygiene and skin antiseptic. A timeout was performed prior to the initiation of the procedure. The right groin was prepped and draped in the usual sterile fashion. The right groin region was infiltrated with lidocaine 1%. Using a micropuncture kit and the modified Seldinger technique, access was gained to the right common femoral artery and 5 French sheath was placed. Real-time ultrasound guidance was utilized for vascular access including the acquisition of a permanent ultrasound image documenting patency of the accessed vessel. Under fluoroscopy, a 4 [place] Berenstein 2 catheter was navigated over a 0.035" Terumo Glidewire into the aortic arch. The catheter was placed into the right common carotid artery. Frontal, lateral and bilateral oblique angiograms of the neck were obtained followed by frontal and lateral angiograms of the head. The catheter was then placed into the left subclavian artery. Frontal angiograms of the neck were obtained. The catheter was then advanced into the left vertebral artery. Frontal and lateral  angiograms of the head were obtained. The catheter was then navigated into the left common carotid artery. Frontal and lateral angiograms of the neck were obtained. However, the catheter retracted backed into the aortic arch during injection due to tortuosity. The catheter was then exchanged over the wire and under fluoroscopy for a 5 French Simmons 2 glide catheter. The catheter tip was reformed in the aortic arch and placed into the left common carotid artery. Frontal, lateral and bilateral oblique angiograms of the neck were obtained followed by frontal and lateral angiograms of the head. FINDINGS: 1. Right common femoral artery ultrasound: Normal caliber of the right common femoral artery, adequate for vascular access. 2. 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 Anastomosis of the left middle meningeal artery to left anterior cerebral artery in the parietal region is also noted. 3. Left subclavian artery angiogram: Increased tortuosity of the left subclavian artery without hemodynamically significant stenosis. Mild atherosclerotic changes at the proximal right vertebral artery without hemodynamically significant stenosis. 4. Left vertebral artery angiograms: Atherosclerotic changes at the distal left vertebral artery resulting in approximately 60% stenosis near the vertebrobasilar junction. Atherosclerotic changes of the basilar and bilateral posterior cerebral arteries. Degree of stenosis is difficult to assess due to patient's motion. Collateral circulation from the right PCA to the right ACA via posterior pericallosal arcade and to the ACA and MCA territory via leptomeningeal collaterals and diminutive PCOMs. 5. Left common carotid artery angiograms: Increased  tortuosity of the proximal left common carotid artery. Atherosclerotic changes in the left carotid bifurcation resulting in approximately 65% stenosis. Contrast opacification of the left MCA and ACA vascular tree seen with faint contrast opacification of the right MCA and ACA vascular tree via anterior communicating artery. Atherosclerotic changes of the left carotid siphon, left M1-M2/MCA segments and left A2-A3/ACA segment without hemodynamically significant stenosis. PROCEDURE: Frontal and lateral angiograms of the neck were obtained and utilized as biplane roadmap. The 5 [place] Sim 2 glide catheter was advanced into the left external carotid artery. Under fluoroscopy, the catheter and the femoral sheath were removed over a 0.035  inch Terumo Glidewire. Then, a 6 [place] shuttle sheath was advanced into the distal left common carotid artery. Frontal and lateral angiograms of the neck were obtained and utilized as biplane roadmap. Next, a 2.5-4.8 mm Emboshield NAV 6 cerebral protection device was navigated into the distal cervical segment of the left ICA. Then, a 8-6 x 40 mm XACT carotid stent was deployed across the left carotid bifurcation, covering the area of stenosis. Subsequently, a 5 x 30 mm Viatrac balloon was navigated into the recently deployed stent. Angioplasty was performed under fluoroscopy. Patient developed transient bradycardia rapidly resolved with balloon deflation. The cerebral protection device was then recaptured. Frontal and lateral angiograms of the neck showed adequate stent position with resolution of stenosis. Angiograms with frontal and lateral views of the head showed no evidence of thromboembolic complication and modest improvement of the right MCA and ACA perfusion via anterior communicating artery. Delayed angiograms with frontal and lateral views of the neck showed no evidence of in stent clot formation. The shuttle sheath was then retracted into the right common femoral artery.  Angiograms were obtained with right anterior oblique and lateral views. The puncture is at the level of the mid right common femoral artery. The artery has normal caliber, adequate for closure device. The shuttle sheath was exchanged over the wire for a Perclose prostyle which was utilized for access closure. Immediate hemostasis was achieved. IMPRESSION: 1. Successful left carotid bifurcation stenting and angioplasty with cerebral protection device. No evidence of thromboembolic complication. 2. 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 Right anterior circulation is also supplied by posterior circulation via posterior pericallosal arcade, diminutive posterior communicating artery and leptomeningeal collaterals. 3. 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 PLAN: 1. Patient to continue on dual anti-platelet therapy with Brilinta 90 mg b.i.d. and aspirin 81 mg q.d. 2. A carotid duplex will be obtained at three-month postprocedure and dual anti-platelet regimen will be re-evaluated at this point. Electronically Signed   By: [name] M.D.   On: 01/13/2022 09:47    Labs:  CBC: Recent Labs    01/09/22 1620 01/10/22 0410 01/12/22 0201 01/13/22 0352  WBC 40.0* 40.4* 39.6* 49.5*  HGB 13.0 12.7* 12.8* 11.8*  HCT 41.5 41.7 39.9 37.4*  PLT 182 150 168 198    COAGS: Recent Labs    01/09/22 1620  INR 1.0    BMP: Recent Labs    01/09/22 1620 01/10/22 0410 01/12/22 0201 01/13/22 0352  NA 140 141 138 137  K 3.4* 4.0 4.2 4.1  CL 104 108 106 107  CO2 _0 GLUCOSE 88 90 93 92  BUN _1 CALCIUM 9.4 9.0 9.5 8.9  CREATININE 1.28* 1.17 1.28* 1.33*  GFRNONAA 59* >60 59* 56*    LIVER FUNCTION TESTS: Recent Labs    01/10/22 0410  BILITOT 0.4  AST 17  ALT 13  ALKPHOS 60  PROT 6.5  ALBUMIN 3.5    Assessment and Plan:  76 y.o. make  inpatient. History of  HTN, leukemia. Presented to the ED at [facility]  on 2.6.22 with left sided facial drop. Found to have a small acute cortical subcortical right frontal infarct. 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 S/p left carotid angioplasty and stenting. Neuro exam stable. Left facial droop noted. Site is soft with  no active bleeding and no appreciable pseudoaneurysm. Dressing is C/D/I  Patient seen at bedside with Dr. Raliegh Ip. Karenann Cai patient sitting up in recliner.Foley catheter in place. States that he is feeling better, He has not ambulated yet nor eaten.   Patient to be seen in [facility] in 3 months to be scheduled in coordination with [place]. Follow up order placed. IR scheduler will call with appointment date and time. Please call [phone] with any questions or concerns   Electronically Signed: Jacqualine Mau, NP 01/13/2022, 10:33 AM   I spent a total of 15 Minutes at the patient's bedside AND on the patient's hospital floor or unit, greater than 50% of which was counseling/coordinating care for s/p cerebral angiogram with left carotid angioplasty and stenting.

## 2022-01-13 NOTE — TOC Transition Note (Signed)
Transition of Care [facility]) - CM/SW Discharge Note   Patient Details  Name: Daniel Osborne MRN: [id number] Date of Birth: Jun 08, 1946  Transition of Care [facility] CM/SW Contact:  Ella Bodo, RN Phone Number: 01/13/2022, 12:11 PM   Clinical Narrative:    76 y.o. 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 PMHx: chronic leukemia and hypertension, and dyslipidemia.  PTA, pt independent and living at home with spouse, who can provide assistance at dc.  OT and ST recommending OP rehab, and referrals made to [facility] in [place] for follow up.  No other discharge needs identified.    Final next level of care: OP Rehab Barriers to Discharge: Barriers Resolved   Patient Goals and CMS Choice Patient states their goals for this hospitalization and ongoing recovery are:: to go home      Discharge Placement                       Discharge Plan and Services   Discharge Planning Services: CM Consult                                 Social Determinants of Health (SDOH) Interventions     Readmission Risk Interventions No flowsheet data found.  Reinaldo Raddle, RN, BSN  Trauma/Neuro ICU Case Manager [phone]

## 2022-01-13 NOTE — Progress Notes (Signed)
STROKE TEAM PROGRESS NOTE   SUBJECTIVE (INTERVAL HISTORY) Patient is sitting in chair, no family at the bedside. He still has left facial droop and dysarthria and mild left hand dexterity difficulty but otherwise stable. Vitals stable and plan to d/c today.   OBJECTIVE Temp:  [98 F (36.7 C)-98.7 F (37.1 C)] 98.1 F (36.7 C) (02/10 0800) Pulse Rate:  [51-86] 68 (02/10 1100) Cardiac Rhythm: Normal sinus rhythm (02/10 0800) Resp:  [12-27] 18 (02/10 1100) BP: (97-179)/(52-137) 116/56 (02/10 1100) SpO2:  [89 %-99 %] 96 % (02/10 1100) Weight:  [72.5 kg] 72.5 kg (02/09 1545)  No results for input(s): GLUCAP in the last 168 hours. Recent Labs  Lab 01/09/22 1620 01/10/22 0410 01/12/22 0201 01/13/22 0352  NA 140 141 138 137  K 3.4* 4.0 4.2 4.1  CL 104 108 106 107  CO2 _0 GLUCOSE 88 90 93 92  BUN _1 CREATININE 1.28* 1.17 1.28* 1.33*  CALCIUM 9.4 9.0 9.5 8.9  MG  --  1.9  --   --    Recent Labs  Lab 01/10/22 0410  AST 17  ALT 13  ALKPHOS 60  BILITOT 0.4  PROT 6.5  ALBUMIN 3.5   Recent Labs  Lab 01/09/22 1620 01/10/22 0410 01/12/22 0201 01/13/22 0352  WBC 40.0* 40.4* 39.6* 49.5*  NEUTROABS 3.9  --   --   --   HGB 13.0 12.7* 12.8* 11.8*  HCT 41.5 41.7 39.9 37.4*  MCV 97.2 97.9 93.0 94.9  PLT 182 150 168 198   No results for input(s): CKTOTAL, CKMB, CKMBINDEX, TROPONINI in the last 168 hours. No results for input(s): LABPROT, INR in the last 72 hours. No results for input(s): COLORURINE, LABSPEC, [place], GLUCOSEU, HGBUR, BILIRUBINUR, KETONESUR, PROTEINUR, UROBILINOGEN, NITRITE, LEUKOCYTESUR in the last 72 hours.  Invalid input(s): APPERANCEUR     Component Value Date/Time   CHOL 218 (H) 01/10/2022 0410   TRIG 119 01/10/2022 0410   HDL 36 (L) 01/10/2022 0410   CHOLHDL 6.1 01/10/2022 0410   VLDL 24 01/10/2022 0410   LDLCALC 158 (H) 01/10/2022 0410   Lab Results  Component Value Date   HGBA1C 6.0 (H) 01/10/2022      Component Value  Date/Time   LABOPIA NONE DETECTED 01/12/2022 0630   COCAINSCRNUR NONE DETECTED 01/12/2022 0630   LABBENZ NONE DETECTED 01/12/2022 0630   AMPHETMU NONE DETECTED 01/12/2022 0630   THCU NONE DETECTED 01/12/2022 0630   LABBARB NONE DETECTED 01/12/2022 0630    No results for input(s): ETH in the last 168 hours.  I have personally reviewed the radiological images below and agree with the radiology interpretations.  CT ANGIO HEAD NECK W WO CM  Result Date: 01/09/2022 CLINICAL DATA:  Neuro deficit, acute, stroke suspected EXAM: CT ANGIOGRAPHY HEAD AND NECK TECHNIQUE: Multidetector CT imaging of the head and neck was performed using the standard protocol during bolus administration of intravenous contrast. Multiplanar CT image reconstructions and MIPs were obtained to evaluate the vascular anatomy. Carotid stenosis measurements (when applicable) are obtained utilizing NASCET criteria, using the distal internal carotid diameter as the denominator. RADIATION DOSE REDUCTION: This exam was performed according to the departmental dose-optimization program which includes automated exposure control, adjustment of the mA and/or kV according to patient size and/or use of iterative reconstruction technique. CONTRAST:  96m OMNIPAQUE IOHEXOL 350 MG/ML SOLN COMPARISON:  None. FINDINGS: CT HEAD FINDINGS Brain: Patchy hypoattenuation in the right frontal white matter and bilateral basal ganglia. No acute  hemorrhage, mass lesion, midline shift, hydrocephalus or visible extra-axial fluid collection. Mild atrophy. Vascular: See below. Skull: No acute fracture. Sinuses: Small right maxillary sinus with mild mucosal thickening. Otherwise, clear sinuses. Orbits: No acute finding. Review of the MIP images confirms the above findings CTA NECK FINDINGS Aortic arch: Great vessel origins are patent. Right carotid system: Mixed calcific and noncalcific atherosclerosis at the carotid bifurcation. 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 The  ICA remains non-opacified in the neck. Left carotid system: Mixed calcific and noncalcific atherosclerosis at the carotid bifurcation and involving the proximal ICA. Approximately 70-80% stenosis of the proximal ICA. Vertebral arteries: Co dominant. Severe right vertebral artery origin stenosis. Otherwise, vertebral arteries are patent without significant stenosis. Skeleton: No evidence of acute abnormality on limited assessment. Other neck: Increased number of lymph nodes in the neck bilaterally and in the visualized upper mediastinum with enlarged bilateral submandibular and upper cervical chain nodes. Left level 2 node measures up to 1.5 cm short axis and is rounded. Submandibular nodes measure up to approximately 1.3 cm short axis on the right. Upper chest: Visualized lung apices are clear. Review of the MIP images confirms the above findings CTA HEAD FINDINGS Anterior circulation: Reconstitution of the right ICA at the proximal petrous segment. Bilateral intracranial ICA atherosclerosis with moderate to severe bilateral paraclinoid ICA stenosis. Bilateral MCAs are patent with multifocal mild-to-moderate M1 and M2 MCA stenosis. Small right A1 ACA, probably congenital given prominent left A1 ACA. Otherwise, ACAs are patent without proximal high-grade stenosis Posterior circulation: Bilateral intradural vertebral arteries are patent. Severe stenosis of the distal right intradural vertebral artery. Moderate stenosis of the distal left intradural vertebral artery. Basilar artery is patent with multifocal mild stenosis. Multifocal severe left P1 and P2 PCA stenosis. Moderate right P1 PCA stenosis. Venous sinuses: As permitted by contrast timing, patent. Review of the MIP images confirms the above findings IMPRESSION: CT head: 1. Patchy hypoattenuation in the right frontal white matter and bilateral basal ganglia, which could represent chronic microvascular ischemic disease versus age indeterminate infarcts in the  absence of priors. MRI could provide more sensitive evaluation for acute infarct. 2. No evidence of acute hemorrhage. CTA: 1. 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 2. Moderate to severe bilateral paraclinoid ICA stenosis. 3. Multifocal severe left P1 and P2 PCA stenosis. Moderate right P1 PCA stenosis. 4. Severe right vertebral artery origin stenosis. Also, severe right and moderate left distal intradural vertebral artery stenosis. 5. Approximately 70-80% stenosis of the proximal left ICA in the neck. 6. Multifocal mild-to-moderate bilateral M1 and M2 MCA stenosis 7. Increased number of lymph nodes in the neck bilaterally and in the visualized upper mediastinum with enlarged and somewhat rounded submandibular and upper cervical chain nodes. While nonspecific, findings raise concern for underlying lymphoproliferative disorder. Consider follow-up CT neck and possibly CT chest/abdomen/pelvis for further evaluation. Findings discussed with Ileene Patrick PA via telephone at 7:16 PM Electronically Signed   By: Margaretha Sheffield M.D.   On: 01/09/2022 19:19   MR BRAIN WO CONTRAST  Result Date: 01/10/2022 CLINICAL DATA:  Neuro deficit, acute, stroke suspected EXAM: MRI HEAD WITHOUT CONTRAST TECHNIQUE: Multiplanar, multiecho pulse sequences of the brain and surrounding structures were obtained without intravenous contrast. COMPARISON:  None. FINDINGS: Brain: There is cortical/subcortical reduced diffusion in the right frontal lobe including involvement of lateral precentral gyrus. Patchy and confluent areas of T2 hyperintensity in  the supratentorial white matter nonspecific but probably reflect mild to moderate chronic microvascular ischemic changes. There are chronic right frontoparietal cortical infarcts. Small chronic infarct of the right frontal subcortical white matter. Prominent perivascular spaces and  probable superimposed chronic small vessel infarcts of the central gray nuclei and white matter. Chronic blood products along the right basal ganglia and adjacent white matter. No intracranial mass or mass effect. There is no hydrocephalus or extra-axial fluid collection. Prominence of the ventricles and sulci reflects mild parenchymal volume loss. Vascular: Diminished right ICA flow void. Skull and upper cervical spine: Marrow signal is mildly heterogeneous but otherwise unremarkable. Sinuses/Orbits: Mild mucosal thickening.  Orbits are unremarkable. Other: Sella is unremarkable.  Mastoid air cells are clear. IMPRESSION: Small acute cortical/subcortical right frontal infarcts with involvement of lateral precentral gyrus. 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 Electronically Signed   By: Macy Mis M.D.   On: 01/10/2022 09:41   IR INTRAVSC STENT CERV CAROTID W/EMB-PROT MOD SED  Result Date: 01/13/2022 INDICATION: Daniel Osborne is a 76 year old male with a past medical history significant for hypertension and chronic leukemia who presented to the [facility] ED on Monday with new onset left sided facial droop, drooling and pocketing of food in the left side of his mouth while eating. CT angiogram of the head and neck showed right ICA occlusion with intracranial reconstitution and left ICA severe stenosis. MRI of the brain was positive for small right MCA territory infarcts. He comes to our service today for a diagnostic cerebral angiogram to confirm CT angiogram findings with possible left carotid stenting in case right ICA chronic occlusion is confirmed. EXAM: ULTRASOUND-GUIDED VASCULAR ACCESS DIAGNOSTIC CEREBRAL ANGIOGRAM LEFT CAROTID STENTING WITH CEREBRAL PROTECTION DEVICE COMPARISON:  CT/CT angiogram of the head and neck January 09, 2022. MEDICATIONS: 5000 units of heparin intravenously ANESTHESIA/SEDATION: The procedure was performed under monitored anesthesia care (MAC).  CONTRAST:  150 mL Omnipaque 300 milligram/mL FLUOROSCOPY: Radiation Exposure Index (as provided by the fluoroscopic device): 161.0 mGy Kerma COMPLICATIONS: None immediate. TECHNIQUE: Informed written consent was obtained from the patient after a thorough discussion of the procedural risks, benefits and alternatives. All questions were addressed. Maximal Sterile Barrier Technique was utilized including caps, mask, sterile gowns, sterile gloves, sterile drape, hand hygiene and skin antiseptic. A timeout was performed prior to the initiation of the procedure. The right groin was prepped and draped in the usual sterile fashion. The right groin region was infiltrated with lidocaine 1%. Using a micropuncture kit and the modified Seldinger technique, access was gained to the right common femoral artery and 5 French sheath was placed. Real-time ultrasound guidance was utilized for vascular access including the acquisition of a permanent ultrasound image documenting patency of the accessed vessel. Under fluoroscopy, a 4 [place] Berenstein 2 catheter was navigated over a 0.035" Terumo Glidewire into the aortic arch. The catheter was placed into the right common carotid artery. Frontal, lateral and bilateral oblique angiograms of the neck were obtained followed by frontal and lateral angiograms of the head. The catheter was then placed into the left subclavian artery. Frontal angiograms of the neck were obtained. The catheter was then advanced into the left vertebral artery. Frontal and lateral angiograms of the head were obtained. The catheter was then navigated into the left common carotid artery. Frontal and lateral angiograms of the neck were obtained. However, the catheter retracted backed into the aortic arch during injection due to tortuosity. The catheter was then exchanged over the wire and under fluoroscopy  for a 5 [place] Simmons 2 glide catheter. The catheter tip was reformed in the aortic arch and placed into the  left common carotid artery. Frontal, lateral and bilateral oblique angiograms of the neck were obtained followed by frontal and lateral angiograms of the head. FINDINGS: 1. Right common femoral artery ultrasound: Normal caliber of the right common femoral artery, adequate for vascular access. 2. 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 Anastomosis of the left middle meningeal artery to left anterior cerebral artery in the parietal region is also noted. 3. Left subclavian artery angiogram: Increased tortuosity of the left subclavian artery without hemodynamically significant stenosis. Mild atherosclerotic changes at the proximal right vertebral artery without hemodynamically significant stenosis. 4. Left vertebral artery angiograms: Atherosclerotic changes at the distal left vertebral artery resulting in approximately 60% stenosis near the vertebrobasilar junction. Atherosclerotic changes of the basilar and bilateral posterior cerebral arteries. Degree of stenosis is difficult to assess due to patient's motion. Collateral circulation from the right PCA to the right ACA via posterior pericallosal arcade and to the ACA and MCA territory via leptomeningeal collaterals and diminutive PCOMs. 5. Left common carotid artery angiograms: Increased tortuosity of the proximal left common carotid artery. Atherosclerotic changes in the left carotid bifurcation resulting in approximately 65% stenosis. Contrast opacification of the left MCA and ACA vascular tree seen with faint contrast opacification of the right MCA and ACA vascular tree via anterior communicating artery. Atherosclerotic changes of the left carotid siphon, left M1-M2/MCA segments and left A2-A3/ACA segment without  hemodynamically significant stenosis. PROCEDURE: Frontal and lateral angiograms of the neck were obtained and utilized as biplane roadmap. The 5 [place] Sim 2 glide catheter was advanced into the left external carotid artery. Under fluoroscopy, the catheter and the femoral sheath were removed over a 0.035 inch Terumo Glidewire. Then, a 6 [place] shuttle sheath was advanced into the distal left common carotid artery. Frontal and lateral angiograms of the neck were obtained and utilized as biplane roadmap. Next, a 2.5-4.8 mm Emboshield NAV 6 cerebral protection device was navigated into the distal cervical segment of the left ICA. Then, a 8-6 x 40 mm XACT carotid stent was deployed across the left carotid bifurcation, covering the area of stenosis. Subsequently, a 5 x 30 mm Viatrac balloon was navigated into the recently deployed stent. Angioplasty was performed under fluoroscopy. Patient developed transient bradycardia rapidly resolved with balloon deflation. The cerebral protection device was then recaptured. Frontal and lateral angiograms of the neck showed adequate stent position with resolution of stenosis. Angiograms with frontal and lateral views of the head showed no evidence of thromboembolic complication and modest improvement of the right MCA and ACA perfusion via anterior communicating artery. Delayed angiograms with frontal and lateral views of the neck showed no evidence of in stent clot formation. The shuttle sheath was then retracted into the right common femoral artery. Angiograms were obtained with right anterior oblique and lateral views. The puncture is at the level of the mid right common femoral artery. The artery has normal caliber, adequate for closure device. The shuttle sheath was exchanged over the wire for a Perclose prostyle which was utilized for access closure. Immediate hemostasis was achieved. IMPRESSION: 1. Successful left carotid bifurcation stenting and angioplasty with cerebral  protection device. No evidence of thromboembolic complication.  2. 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 Right anterior circulation is also supplied by posterior circulation via posterior pericallosal arcade, diminutive posterior communicating artery and leptomeningeal collaterals. 3. 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 PLAN: 1. Patient to continue on dual anti-platelet therapy with Brilinta 90 mg b.i.d. and aspirin 81 mg q.d. 2. A carotid duplex will be obtained at three-month postprocedure and dual anti-platelet regimen will be re-evaluated at this point. Electronically Signed   By: [name] M.D.   On: 01/13/2022 09:47   IR US Guide Vasc Access Right  Result Date: 01/13/2022 INDICATION: Daniel Osborne is a 76 year old male with a past medical history significant for hypertension and chronic leukemia who presented to the [facility] ED on Monday with new onset left sided facial droop, drooling and pocketing of food in the left side of his mouth while eating. CT angiogram of the head and neck showed right ICA occlusion with intracranial reconstitution and left ICA severe stenosis. MRI of the brain was positive for small right MCA territory infarcts. He comes to our service today for a diagnostic cerebral angiogram to confirm CT angiogram findings with possible left carotid stenting in case right ICA chronic occlusion is confirmed. EXAM: ULTRASOUND-GUIDED VASCULAR ACCESS DIAGNOSTIC CEREBRAL ANGIOGRAM LEFT CAROTID STENTING WITH CEREBRAL PROTECTION DEVICE COMPARISON:  CT/CT angiogram of the head and neck January 09, 2022. MEDICATIONS: 5000 units of heparin intravenously ANESTHESIA/SEDATION: The procedure was performed under monitored anesthesia care (MAC). CONTRAST:  150 mL Omnipaque 300 milligram/mL FLUOROSCOPY: Radiation Exposure Index (as provided by the  fluoroscopic device): 725.3 mGy Kerma COMPLICATIONS: None immediate. TECHNIQUE: Informed written consent was obtained from the patient after a thorough discussion of the procedural risks, benefits and alternatives. All questions were addressed. Maximal Sterile Barrier Technique was utilized including caps, mask, sterile gowns, sterile gloves, sterile drape, hand hygiene and skin antiseptic. A timeout was performed prior to the initiation of the procedure. The right groin was prepped and draped in the usual sterile fashion. The right groin region was infiltrated with lidocaine 1%. Using a micropuncture kit and the modified Seldinger technique, access was gained to the right common femoral artery and 5 French sheath was placed. Real-time ultrasound guidance was utilized for vascular access including the acquisition of a permanent ultrasound image documenting patency of the accessed vessel. Under fluoroscopy, a 4 [place] Berenstein 2 catheter was navigated over a 0.035" Terumo Glidewire into the aortic arch. The catheter was placed into the right common carotid artery. Frontal, lateral and bilateral oblique angiograms of the neck were obtained followed by frontal and lateral angiograms of the head. The catheter was then placed into the left subclavian artery. Frontal angiograms of the neck were obtained. The catheter was then advanced into the left vertebral artery. Frontal and lateral angiograms of the head were obtained. The catheter was then navigated into the left common carotid artery. Frontal and lateral angiograms of the neck were obtained. However, the catheter retracted backed into the aortic arch during injection due to tortuosity. The catheter was then exchanged over the wire and under fluoroscopy for a 5 [place] Simmons 2 glide catheter. The catheter tip was reformed in the aortic arch and placed into the left common carotid artery. Frontal, lateral and bilateral oblique angiograms of the neck were obtained  followed by frontal and lateral angiograms of the head. FINDINGS: 1. Right common femoral artery ultrasound: Normal caliber  of the right common femoral artery, adequate for vascular access. 2. 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 Anastomosis of the left middle meningeal artery to left anterior cerebral artery in the parietal region is also noted. 3. Left subclavian artery angiogram: Increased tortuosity of the left subclavian artery without hemodynamically significant stenosis. Mild atherosclerotic changes at the proximal right vertebral artery without hemodynamically significant stenosis. 4. Left vertebral artery angiograms: Atherosclerotic changes at the distal left vertebral artery resulting in approximately 60% stenosis near the vertebrobasilar junction. Atherosclerotic changes of the basilar and bilateral posterior cerebral arteries. Degree of stenosis is difficult to assess due to patient's motion. Collateral circulation from the right PCA to the right ACA via posterior pericallosal arcade and to the ACA and MCA territory via leptomeningeal collaterals and diminutive PCOMs. 5. Left common carotid artery angiograms: Increased tortuosity of the proximal left common carotid artery. Atherosclerotic changes in the left carotid bifurcation resulting in approximately 65% stenosis. Contrast opacification of the left MCA and ACA vascular tree seen with faint contrast opacification of the right MCA and ACA vascular tree via anterior communicating artery. Atherosclerotic changes of the left carotid siphon, left M1-M2/MCA segments and left A2-A3/ACA segment without hemodynamically significant stenosis. PROCEDURE: Frontal and lateral angiograms of the neck were obtained  and utilized as biplane roadmap. The 5 [place] Sim 2 glide catheter was advanced into the left external carotid artery. Under fluoroscopy, the catheter and the femoral sheath were removed over a 0.035 inch Terumo Glidewire. Then, a 6 [place] shuttle sheath was advanced into the distal left common carotid artery. Frontal and lateral angiograms of the neck were obtained and utilized as biplane roadmap. Next, a 2.5-4.8 mm Emboshield NAV 6 cerebral protection device was navigated into the distal cervical segment of the left ICA. Then, a 8-6 x 40 mm XACT carotid stent was deployed across the left carotid bifurcation, covering the area of stenosis. Subsequently, a 5 x 30 mm Viatrac balloon was navigated into the recently deployed stent. Angioplasty was performed under fluoroscopy. Patient developed transient bradycardia rapidly resolved with balloon deflation. The cerebral protection device was then recaptured. Frontal and lateral angiograms of the neck showed adequate stent position with resolution of stenosis. Angiograms with frontal and lateral views of the head showed no evidence of thromboembolic complication and modest improvement of the right MCA and ACA perfusion via anterior communicating artery. Delayed angiograms with frontal and lateral views of the neck showed no evidence of in stent clot formation. The shuttle sheath was then retracted into the right common femoral artery. Angiograms were obtained with right anterior oblique and lateral views. The puncture is at the level of the mid right common femoral artery. The artery has normal caliber, adequate for closure device. The shuttle sheath was exchanged over the wire for a Perclose prostyle which was utilized for access closure. Immediate hemostasis was achieved. IMPRESSION: 1. Successful left carotid bifurcation stenting and angioplasty with cerebral protection device. No evidence of thromboembolic complication. 2. Complete occlusion of the right internal  carotid artery at the distal bulb with intracranial reconstitution via vidian artery. Right anterior circulation is also supplied by posterior circulation via posterior pericallosal arcade, diminutive posterior communicating artery and leptomeningeal collaterals. 3. 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 PLAN: 1. Patient to  continue on dual anti-platelet therapy with Brilinta 90 mg b.i.d. and aspirin 81 mg q.d. 2. A carotid duplex will be obtained at three-month postprocedure and dual anti-platelet regimen will be re-evaluated at this point. Electronically Signed   By: [name] M.D.   On: 01/13/2022 09:47   ECHOCARDIOGRAM COMPLETE  Result Date: 01/10/2022    ECHOCARDIOGRAM REPORT   Patient Name:   Daniel Osborne Date of Exam: 01/10/2022 Medical Rec #:  [id number]       Height:       69.0 in Accession #:    [id number]      Weight:       170.2 lb Date of Birth:  [birth year]       BSA:          1.929 m Patient Age:    60 years        BP:           141/84 mmHg Patient Gender: M               HR:           81 bpm. Exam Location:  [facility] Procedure: 2D Echo, Cardiac Doppler and Color Doppler Indications:    Stroke I63.9  History:        Patient has no prior history of Echocardiogram examinations.                 Risk Factors:Hypertension. CVA (cerebral vascular accident)                 ([place]).  Sonographer:    Alvino Chapel RCS Referring Phys: [id number] ASIA B [place]  1. Left ventricular ejection fraction, by estimation, is 60 to 65%. The left ventricle has normal function. The left ventricle has no regional wall motion abnormalities. Left ventricular diastolic parameters are consistent with Grade I diastolic dysfunction (impaired relaxation).  2. Right ventricular systolic function is normal. The right ventricular size is normal. Tricuspid regurgitation signal is inadequate for assessing PA pressure.  3. The mitral valve is degenerative.  Trivial mitral valve regurgitation. No evidence of mitral stenosis.  4. The aortic valve is tricuspid. Aortic valve regurgitation is trivial. Aortic valve sclerosis is present, with no evidence of aortic valve stenosis.  5. The inferior vena cava is normal in size with greater than 50% respiratory variability, suggesting right atrial pressure of 3 mmHg. Comparison(s): No prior Echocardiogram. FINDINGS  Left Ventricle: Left ventricular ejection fraction, by estimation, is 60 to 65%. The left ventricle has normal function. The left ventricle has no regional wall motion abnormalities. The left ventricular internal cavity size was normal in size. There is  no left ventricular hypertrophy. Left ventricular diastolic parameters are consistent with Grade I diastolic dysfunction (impaired relaxation). Right Ventricle: The right ventricular size is normal. No increase in right ventricular wall thickness. Right ventricular systolic function is normal. Tricuspid regurgitation signal is inadequate for assessing PA pressure. Left Atrium: Left atrial size was normal in size. Right Atrium: Right atrial size was normal in size. Pericardium: There is no evidence of pericardial effusion. Mitral Valve: The mitral valve is degenerative in appearance. Trivial mitral valve regurgitation. No evidence of mitral valve stenosis. Tricuspid Valve: The tricuspid valve is normal in structure. Tricuspid valve regurgitation is trivial. No evidence of tricuspid stenosis. Aortic Valve: The aortic valve is tricuspid. Aortic valve regurgitation is trivial. Aortic valve sclerosis is present, with no evidence of aortic valve stenosis. Aortic valve mean gradient measures  12.0 mmHg. Aortic valve peak gradient measures 20.6 mmHg. Aortic valve area, by VTI measures 1.42 cm. Pulmonic Valve: The pulmonic valve was not well visualized. Pulmonic valve regurgitation is not visualized. No evidence of pulmonic stenosis. Aorta: The aortic root is normal in size  and structure. Venous: The inferior vena cava is normal in size with greater than 50% respiratory variability, suggesting right atrial pressure of 3 mmHg. IAS/Shunts: No atrial level shunt detected by color flow Doppler.  LEFT VENTRICLE PLAX 2D LVIDd:         4.20 cm   Diastology LVIDs:         2.80 cm   LV e' medial:    7.94 cm/s LV PW:         1.00 cm   LV E/e' medial:  11.6 LV IVS:        0.90 cm   LV e' lateral:   5.98 cm/s LVOT diam:     1.90 cm   LV E/e' lateral: 15.5 LV SV:         67 LV SV Index:   35 LVOT Area:     2.84 cm  RIGHT VENTRICLE RV S prime:     12.10 cm/s TAPSE (M-mode): 2.1 cm LEFT ATRIUM             Index        RIGHT ATRIUM           Index LA diam:        3.60 cm 1.87 cm/m   RA Area:     15.70 cm LA Vol (A2C):   62.3 ml 32.30 ml/m  RA Volume:   40.20 ml  20.84 ml/m LA Vol (A4C):   45.2 ml 23.43 ml/m LA Biplane Vol: 55.0 ml 28.51 ml/m  AORTIC VALVE AV Area (Vmax):    1.44 cm AV Area (Vmean):   1.28 cm AV Area (VTI):     1.42 cm AV Vmax:           227.00 cm/s AV Vmean:          160.000 cm/s AV VTI:            0.470 m AV Peak Grad:      20.6 mmHg AV Mean Grad:      12.0 mmHg LVOT Vmax:         115.00 cm/s LVOT Vmean:        72.000 cm/s LVOT VTI:          0.235 m LVOT/AV VTI ratio: 0.50  AORTA Ao Root diam: 3.10 cm MITRAL VALVE MV Area (PHT): 3.37 cm     SHUNTS MV Decel Time: 225 msec     Systemic VTI:  0.24 m MV E velocity: 92.50 cm/s   Systemic Diam: 1.90 cm MV A velocity: 111.00 cm/s MV E/A ratio:  0.83 Rudean Haskell MD Electronically signed by Rudean Haskell MD Signature Date/Time: 01/10/2022/12:08:44 PM    Final    IR ANGIO EXTRACRAN SEL COM CAROTID INNOMINATE UNI BILAT MOD SED  Result Date: 01/13/2022 INDICATION: Daniel Osborne is a 76 year old male with a past medical history significant for hypertension and chronic leukemia who presented to the [facility] ED on Monday with new onset left sided facial droop, drooling and pocketing of food in the left side of his  mouth while eating. CT angiogram of the head and neck showed right ICA occlusion with intracranial reconstitution and left ICA severe stenosis. MRI of the brain was positive  for small right MCA territory infarcts. He comes to our service today for a diagnostic cerebral angiogram to confirm CT angiogram findings with possible left carotid stenting in case right ICA chronic occlusion is confirmed. EXAM: ULTRASOUND-GUIDED VASCULAR ACCESS DIAGNOSTIC CEREBRAL ANGIOGRAM LEFT CAROTID STENTING WITH CEREBRAL PROTECTION DEVICE COMPARISON:  CT/CT angiogram of the head and neck January 09, 2022. MEDICATIONS: 5000 units of heparin intravenously ANESTHESIA/SEDATION: The procedure was performed under monitored anesthesia care (MAC). CONTRAST:  150 mL Omnipaque 300 milligram/mL FLUOROSCOPY: Radiation Exposure Index (as provided by the fluoroscopic device): 628.3 mGy Kerma COMPLICATIONS: None immediate. TECHNIQUE: Informed written consent was obtained from the patient after a thorough discussion of the procedural risks, benefits and alternatives. All questions were addressed. Maximal Sterile Barrier Technique was utilized including caps, mask, sterile gowns, sterile gloves, sterile drape, hand hygiene and skin antiseptic. A timeout was performed prior to the initiation of the procedure. The right groin was prepped and draped in the usual sterile fashion. The right groin region was infiltrated with lidocaine 1%. Using a micropuncture kit and the modified Seldinger technique, access was gained to the right common femoral artery and 5 French sheath was placed. Real-time ultrasound guidance was utilized for vascular access including the acquisition of a permanent ultrasound image documenting patency of the accessed vessel. Under fluoroscopy, a 4 [place] Berenstein 2 catheter was navigated over a 0.035" Terumo Glidewire into the aortic arch. The catheter was placed into the right common carotid artery. Frontal, lateral and bilateral  oblique angiograms of the neck were obtained followed by frontal and lateral angiograms of the head. The catheter was then placed into the left subclavian artery. Frontal angiograms of the neck were obtained. The catheter was then advanced into the left vertebral artery. Frontal and lateral angiograms of the head were obtained. The catheter was then navigated into the left common carotid artery. Frontal and lateral angiograms of the neck were obtained. However, the catheter retracted backed into the aortic arch during injection due to tortuosity. The catheter was then exchanged over the wire and under fluoroscopy for a 5 French Simmons 2 glide catheter. The catheter tip was reformed in the aortic arch and placed into the left common carotid artery. Frontal, lateral and bilateral oblique angiograms of the neck were obtained followed by frontal and lateral angiograms of the head. FINDINGS: 1. Right common femoral artery ultrasound: Normal caliber of the right common femoral artery, adequate for vascular access. 2. 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 Anastomosis of the left middle meningeal artery to left anterior cerebral artery in the parietal region is also noted. 3. Left subclavian artery angiogram: Increased tortuosity of the left subclavian artery without hemodynamically significant stenosis. Mild atherosclerotic changes at the proximal right vertebral artery without hemodynamically significant stenosis. 4. Left vertebral artery angiograms: Atherosclerotic changes at the distal left vertebral artery resulting in approximately 60% stenosis near the vertebrobasilar junction. Atherosclerotic changes of the basilar and bilateral posterior cerebral arteries. Degree  of stenosis is difficult to assess due to patient's motion. Collateral circulation from the right PCA to the right ACA via posterior pericallosal arcade and to the ACA and MCA territory via leptomeningeal collaterals and diminutive PCOMs. 5. Left common carotid artery angiograms: Increased tortuosity of the proximal left common carotid artery. Atherosclerotic changes in the left  carotid bifurcation resulting in approximately 65% stenosis. Contrast opacification of the left MCA and ACA vascular tree seen with faint contrast opacification of the right MCA and ACA vascular tree via anterior communicating artery. Atherosclerotic changes of the left carotid siphon, left M1-M2/MCA segments and left A2-A3/ACA segment without hemodynamically significant stenosis. PROCEDURE: Frontal and lateral angiograms of the neck were obtained and utilized as biplane roadmap. The 5 [place] Sim 2 glide catheter was advanced into the left external carotid artery. Under fluoroscopy, the catheter and the femoral sheath were removed over a 0.035 inch Terumo Glidewire. Then, a 6 [place] shuttle sheath was advanced into the distal left common carotid artery. Frontal and lateral angiograms of the neck were obtained and utilized as biplane roadmap. Next, a 2.5-4.8 mm Emboshield NAV 6 cerebral protection device was navigated into the distal cervical segment of the left ICA. Then, a 8-6 x 40 mm XACT carotid stent was deployed across the left carotid bifurcation, covering the area of stenosis. Subsequently, a 5 x 30 mm Viatrac balloon was navigated into the recently deployed stent. Angioplasty was performed under fluoroscopy. Patient developed transient bradycardia rapidly resolved with balloon deflation. The cerebral protection device was then recaptured. Frontal and lateral angiograms of the neck showed adequate stent position with resolution of stenosis. Angiograms with frontal and lateral views of the head showed no evidence of thromboembolic  complication and modest improvement of the right MCA and ACA perfusion via anterior communicating artery. Delayed angiograms with frontal and lateral views of the neck showed no evidence of in stent clot formation. The shuttle sheath was then retracted into the right common femoral artery. Angiograms were obtained with right anterior oblique and lateral views. The puncture is at the level of the mid right common femoral artery. The artery has normal caliber, adequate for closure device. The shuttle sheath was exchanged over the wire for a Perclose prostyle which was utilized for access closure. Immediate hemostasis was achieved. IMPRESSION: 1. Successful left carotid bifurcation stenting and angioplasty with cerebral protection device. No evidence of thromboembolic complication. 2. 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 Right anterior circulation is also supplied by posterior circulation via posterior pericallosal arcade, diminutive posterior communicating artery and leptomeningeal collaterals. 3. 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 PLAN: 1. Patient to continue on dual anti-platelet therapy with Brilinta 90 mg b.i.d. and aspirin 81 mg q.d. 2. A carotid duplex will be obtained at three-month postprocedure and dual anti-platelet regimen will be re-evaluated at this point. Electronically Signed   By: [name] M.D.   On: 01/13/2022 09:47   IR ANGIO VERTEBRAL SEL VERTEBRAL UNI L MOD SED  Result Date: 01/13/2022 INDICATION: Daniel Osborne is a 76 year old male with a past medical history significant for hypertension and chronic leukemia who presented to the [facility] ED on Monday with new onset left sided facial droop, drooling and pocketing of food in the left side of his mouth while eating. CT angiogram of the head and neck showed right ICA occlusion with  intracranial reconstitution and left ICA severe stenosis. MRI of the brain was positive for small right MCA territory infarcts. He comes to our service today for a diagnostic cerebral angiogram to confirm CT angiogram findings with possible left carotid stenting in case right ICA chronic occlusion is confirmed. EXAM: ULTRASOUND-GUIDED VASCULAR ACCESS DIAGNOSTIC CEREBRAL ANGIOGRAM LEFT CAROTID STENTING WITH CEREBRAL PROTECTION DEVICE COMPARISON:  CT/CT  angiogram of the head and neck January 09, 2022. MEDICATIONS: 5000 units of heparin intravenously ANESTHESIA/SEDATION: The procedure was performed under monitored anesthesia care (MAC). CONTRAST:  150 mL Omnipaque 300 milligram/mL FLUOROSCOPY: Radiation Exposure Index (as provided by the fluoroscopic device): 354.6 mGy Kerma COMPLICATIONS: None immediate. TECHNIQUE: Informed written consent was obtained from the patient after a thorough discussion of the procedural risks, benefits and alternatives. All questions were addressed. Maximal Sterile Barrier Technique was utilized including caps, mask, sterile gowns, sterile gloves, sterile drape, hand hygiene and skin antiseptic. A timeout was performed prior to the initiation of the procedure. The right groin was prepped and draped in the usual sterile fashion. The right groin region was infiltrated with lidocaine 1%. Using a micropuncture kit and the modified Seldinger technique, access was gained to the right common femoral artery and 5 French sheath was placed. Real-time ultrasound guidance was utilized for vascular access including the acquisition of a permanent ultrasound image documenting patency of the accessed vessel. Under fluoroscopy, a 4 [place] Berenstein 2 catheter was navigated over a 0.035" Terumo Glidewire into the aortic arch. The catheter was placed into the right common carotid artery. Frontal, lateral and bilateral oblique angiograms of the neck were obtained followed by frontal and lateral angiograms of  the head. The catheter was then placed into the left subclavian artery. Frontal angiograms of the neck were obtained. The catheter was then advanced into the left vertebral artery. Frontal and lateral angiograms of the head were obtained. The catheter was then navigated into the left common carotid artery. Frontal and lateral angiograms of the neck were obtained. However, the catheter retracted backed into the aortic arch during injection due to tortuosity. The catheter was then exchanged over the wire and under fluoroscopy for a 5 French Simmons 2 glide catheter. The catheter tip was reformed in the aortic arch and placed into the left common carotid artery. Frontal, lateral and bilateral oblique angiograms of the neck were obtained followed by frontal and lateral angiograms of the head. FINDINGS: 1. Right common femoral artery ultrasound: Normal caliber of the right common femoral artery, adequate for vascular access. 2. 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 Anastomosis of the left middle meningeal artery to left anterior cerebral artery in the parietal region is also noted. 3. Left subclavian artery angiogram: Increased tortuosity of the left subclavian artery without hemodynamically significant stenosis. Mild atherosclerotic changes at the proximal right vertebral artery without hemodynamically significant stenosis. 4. Left vertebral artery angiograms: Atherosclerotic changes at the distal left vertebral artery resulting in approximately 60% stenosis near the vertebrobasilar junction. Atherosclerotic changes of the basilar and bilateral posterior cerebral arteries. Degree of stenosis is difficult to assess due to patient's motion. Collateral circulation from the  right PCA to the right ACA via posterior pericallosal arcade and to the ACA and MCA territory via leptomeningeal collaterals and diminutive PCOMs. 5. Left common carotid artery angiograms: Increased tortuosity of the proximal left common carotid artery. Atherosclerotic changes in the left carotid bifurcation resulting in approximately 65% stenosis. Contrast opacification of the left MCA and ACA vascular tree seen with faint contrast opacification of the right MCA and ACA vascular tree via anterior communicating artery. Atherosclerotic changes of the left carotid siphon, left M1-M2/MCA segments and left A2-A3/ACA segment without hemodynamically significant stenosis.  PROCEDURE: Frontal and lateral angiograms of the neck were obtained and utilized as biplane roadmap. The 5 [place] Sim 2 glide catheter was advanced into the left external carotid artery. Under fluoroscopy, the catheter and the femoral sheath were removed over a 0.035 inch Terumo Glidewire. Then, a 6 [place] shuttle sheath was advanced into the distal left common carotid artery. Frontal and lateral angiograms of the neck were obtained and utilized as biplane roadmap. Next, a 2.5-4.8 mm Emboshield NAV 6 cerebral protection device was navigated into the distal cervical segment of the left ICA. Then, a 8-6 x 40 mm XACT carotid stent was deployed across the left carotid bifurcation, covering the area of stenosis. Subsequently, a 5 x 30 mm Viatrac balloon was navigated into the recently deployed stent. Angioplasty was performed under fluoroscopy. Patient developed transient bradycardia rapidly resolved with balloon deflation. The cerebral protection device was then recaptured. Frontal and lateral angiograms of the neck showed adequate stent position with resolution of stenosis. Angiograms with frontal and lateral views of the head showed no evidence of thromboembolic complication and modest improvement of the right MCA and ACA perfusion via anterior  communicating artery. Delayed angiograms with frontal and lateral views of the neck showed no evidence of in stent clot formation. The shuttle sheath was then retracted into the right common femoral artery. Angiograms were obtained with right anterior oblique and lateral views. The puncture is at the level of the mid right common femoral artery. The artery has normal caliber, adequate for closure device. The shuttle sheath was exchanged over the wire for a Perclose prostyle which was utilized for access closure. Immediate hemostasis was achieved. IMPRESSION: 1. Successful left carotid bifurcation stenting and angioplasty with cerebral protection device. No evidence of thromboembolic complication. 2. 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 Right anterior circulation is also supplied by posterior circulation via posterior pericallosal arcade, diminutive posterior communicating artery and leptomeningeal collaterals. 3. 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 PLAN: 1. Patient to continue on dual anti-platelet therapy with Brilinta 90 mg b.i.d. and aspirin 81 mg q.d. 2. A carotid duplex will be obtained at three-month postprocedure and dual anti-platelet regimen will be re-evaluated at this point. Electronically Signed   By: [name] M.D.   On: 01/13/2022 09:47     PHYSICAL EXAM  Temp:  [98 F (36.7 C)-98.7 F (37.1 C)] 98.1 F (36.7 C) (02/10 0800) Pulse Rate:  [51-86] 68 (02/10 1100) Resp:  [12-27] 18 (02/10 1100) BP: (97-179)/(52-137) 116/56 (02/10 1100) SpO2:  [89 %-99 %] 96 % (02/10 1100) Weight:  [72.5 kg] 72.5 kg (02/09 1545)  General - Well nourished, well developed, in no apparent distress.  Cardiovascular - Regular rhythm and rate.   NEURO:  Mental Status: AA&Ox3  Speech/Language: speech is with mild dysarthria, most notable on  repetition.  Cranial Nerves:  II: PERRL.  III, IV, VI: EOMI. Eyelids elevate symmetrically.  V: Sensation is intact to light touch and diminished on the left VII: left sided facial droop present VIII: hearing intact to voice. IX, X: Phonation is normal.  XII: tongue is midline without fasciculations. Motor: 5/5 strength to RUE and BLE, very slightly diminished strength in LUE  Tone: is normal and bulk is normal Sensation- Intact to light touch bilaterally. Extinction absent to light touch to DSS.   Coordination: FTN intact bilaterally. No drift.  Gait- deferred    ASSESSMENT/PLAN Mr.  Daniel Osborne is a 76 y.o. male with history of HTN, CLL admitted for left facial droop and slurry speech. No tPA given due to OSW.  He is now s/p left ICA stent placement for 70-80% stenosis.  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 Norma Fredrickson 2/9 2D Echo  EF 60-65% LDL 158 HgbA1c 6.0 Heparin subq for VTE prophylaxis No antithrombotic prior to admission, now on aspirin 81 mg daily and Brilinta (ticagrelor) 90 mg bid after loading dose. Continue on discharge. Patient counseled to be compliant with his antithrombotic medications Ongoing aggressive stroke risk factor management Therapy recommendations:  outpt PT/OT Disposition:  home today  Hypertension Stable Avoid low BP Long term BP goal 130-150 given right ICA occlusion   Hyperlipidemia Home meds:  none  LDL 158, goal < 70 Now on lipitor  80 Continue statin at discharge  Other Stroke Risk Factors Advanced age  Other Active Problems [facility] -  [facility]. Per pt, his baseline WBC is around 40, no specific treatment as long as it is stable. Currently 39.6.   Hospital day # 3  Neurology will sign off. Please call with questions. Pt will follow up with stroke clinic NP at [facility] in about 4 weeks. Thanks for the consult.   Rosalin Hawking, MD PhD Stroke Neurology 01/13/2022 1:53 PM    To contact Stroke Continuity provider, please refer to [email or web address] After hours, contact General Neurology

## 2022-01-13 NOTE — Discharge Summary (Signed)
Physician Discharge Summary  Daniel Osborne XTK:[id number] DOB: [birth year] DOA: 01/09/2022  PCP: [facility]  Admit date: 01/09/2022 Discharge date: 01/13/2022  Admitted From: Home Disposition:  Home  Discharge Condition:Stable CODE STATUS:FULL Diet recommendation: Heart Healthy    Brief/Interim Summary:   76 y.o. male with medical history significant of with history of chronic leukemia and hypertension, and dyslipidemia admitted on 01/09/2022 with at [facility] hospital with complaint of left facial droop, left-sided weakness, slurred speech.  MRI of the brain showed a small acute cortical/subcortical right frontal infarcts with involvement of lateral precentral gyrus.  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  He was transferred to [facility] for further work-up.  Neurology following.  He underwent  cerebral angiogram with  left ICA stent.  Medically stable for discharge to home today.  PT/OT/speech following with no follow-up recommendation .  Following problems were addressed during hospitalization:  CVA (cerebral vascular accident) ([place])- (present on admission) - Acute CVA -MRI Brain showed Small acute cortical/subcortical right frontal infarcts with involvement of lateral precentral gyrus.  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  S/p left ICA  stenting    Leukocytosis- (present on admission) - Has history of chronic leukemia and chronic leukocytosis.Follows up at [facility] -We recommend to follow-up with his oncologist as an outpatient   Essential hypertension- (present on admission) Continue home medication lisinopril   Discharge Diagnoses:  Principal Problem:   CVA (cerebral vascular accident) [facility]) Active Problems:   Hypokalemia   Essential hypertension   Leukocytosis   Acute CVA (cerebrovascular accident) [facility])    Discharge Instructions  Discharge Instructions     Ambulatory referral to Neurology   Complete by: As directed    An appointment is requested in approximately: 4 weeks   Diet - low sodium heart healthy   Complete by: As directed    Discharge instructions   Complete by: As directed    1)Please take prescribed medications as instructed 2)Follow up with your PCP in a week 3)Follow up with neurology and interventional radiology as an outpatient.  Name and number the provider group has been attached   Increase activity slowly   Complete by: As directed    No wound care   Complete by: As directed       Allergies as of 01/13/2022   No Known Allergies      Medication List     STOP taking these medications    amoxicillin-clavulanate 875-125 MG tablet Commonly known as: AUGMENTIN   fluticasone 50 MCG/ACT nasal spray Commonly known as: FLONASE       TAKE these medications    acetaminophen 500 MG tablet Commonly known as: TYLENOL Take 500 mg by mouth every 6 (six) hours as needed.   aspirin 81 MG chewable tablet Chew 1 tablet (81 mg total) by mouth daily. Start taking on: January 14, 2022   atorvastatin 80 MG tablet Commonly known as: LIPITOR Take 1 tablet (  80 mg total) by mouth daily. Start taking on: January 14, 2022   lisinopril 10 MG tablet Commonly known as: ZESTRIL Take 5 mg by mouth daily.   ticagrelor 90 MG Tabs tablet Commonly known as: BRILINTA Take 1 tablet (90 mg  total) by mouth 2 (two) times daily.        Follow-up Information     de Sindy Messing, Erven Colla, MD Follow up in 3 month(s).   Specialties: Radiology, Interventional Radiology Why: 3 months to be coordinated with [place] on the same dat Contact information: [place] [address] [facility]. Schedule an appointment as soon as possible for a visit in 4 week(s).   Specialty: Neurology Contact information: [address] [place] [phone]               No Known Allergies  Consultations: Neurology,IR   Procedures/Studies: CT ANGIO HEAD NECK W WO CM  Result Date: 01/09/2022 CLINICAL DATA:  Neuro deficit, acute, stroke suspected EXAM: CT ANGIOGRAPHY HEAD AND NECK TECHNIQUE: Multidetector CT imaging of the head and neck was performed using the standard protocol during bolus administration of intravenous contrast. Multiplanar CT image reconstructions and MIPs were obtained to evaluate the vascular anatomy. Carotid stenosis measurements (when applicable) are obtained utilizing NASCET criteria, using the distal internal carotid diameter as the denominator. RADIATION DOSE REDUCTION: This exam was performed according to the departmental dose-optimization program which includes automated exposure control, adjustment of the mA and/or kV according to patient size and/or use of iterative reconstruction technique. CONTRAST:  71m OMNIPAQUE IOHEXOL 350 MG/ML SOLN COMPARISON:  None. FINDINGS: CT HEAD FINDINGS Brain: Patchy hypoattenuation in the right frontal white matter and bilateral basal ganglia. No acute hemorrhage, mass lesion, midline shift, hydrocephalus or visible extra-axial fluid collection. Mild atrophy. Vascular: See below. Skull: No acute fracture. Sinuses: Small right maxillary sinus with mild mucosal thickening. Otherwise, clear sinuses. Orbits: No acute finding. Review of the  MIP images confirms the above findings CTA NECK FINDINGS Aortic arch: Great vessel origins are patent. Right carotid system: Mixed calcific and noncalcific atherosclerosis at the carotid bifurcation. 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 The ICA remains non-opacified in the neck. Left carotid system: Mixed calcific and noncalcific atherosclerosis at the carotid bifurcation and involving the proximal ICA. Approximately 70-80% stenosis of the proximal ICA. Vertebral arteries: Co dominant. Severe right vertebral artery origin stenosis. Otherwise, vertebral arteries are patent without significant stenosis. Skeleton: No evidence of acute abnormality on limited assessment. Other neck: Increased number of lymph nodes in the neck bilaterally and in the visualized upper mediastinum with enlarged bilateral submandibular and upper cervical chain nodes. Left level 2 node measures up to 1.5 cm short axis and is rounded. Submandibular nodes measure up to approximately 1.3 cm short axis on the right. Upper chest: Visualized lung apices are clear. Review of the MIP images confirms the above findings CTA HEAD FINDINGS Anterior circulation: Reconstitution of the right ICA at the proximal petrous segment. Bilateral intracranial ICA atherosclerosis with moderate to severe bilateral paraclinoid ICA stenosis. Bilateral MCAs are patent with multifocal mild-to-moderate M1 and M2 MCA stenosis. Small right A1 ACA, probably congenital given prominent left A1 ACA. Otherwise, ACAs are patent without proximal high-grade stenosis Posterior circulation: Bilateral intradural vertebral arteries are patent. Severe stenosis of the distal right intradural vertebral artery. Moderate stenosis of the distal left intradural vertebral artery. Basilar artery is patent with multifocal  mild stenosis. Multifocal severe left P1 and P2 PCA stenosis. Moderate right P1 PCA stenosis. Venous sinuses: As permitted by contrast timing, patent. Review of the MIP images  confirms the above findings IMPRESSION: CT head: 1. Patchy hypoattenuation in the right frontal white matter and bilateral basal ganglia, which could represent chronic microvascular ischemic disease versus age indeterminate infarcts in the absence of priors. MRI could provide more sensitive evaluation for acute infarct. 2. No evidence of acute hemorrhage. CTA: 1. 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 2. Moderate to severe bilateral paraclinoid ICA stenosis. 3. Multifocal severe left P1 and P2 PCA stenosis. Moderate right P1 PCA stenosis. 4. Severe right vertebral artery origin stenosis. Also, severe right and moderate left distal intradural vertebral artery stenosis. 5. Approximately 70-80% stenosis of the proximal left ICA in the neck. 6. Multifocal mild-to-moderate bilateral M1 and M2 MCA stenosis 7. Increased number of lymph nodes in the neck bilaterally and in the visualized upper mediastinum with enlarged and somewhat rounded submandibular and upper cervical chain nodes. While nonspecific, findings raise concern for underlying lymphoproliferative disorder. Consider follow-up CT neck and possibly CT chest/abdomen/pelvis for further evaluation. Findings discussed with Ileene Patrick PA via telephone at 7:16 PM Electronically Signed   By: Margaretha Sheffield M.D.   On: 01/09/2022 19:19   MR BRAIN WO CONTRAST  Result Date: 01/10/2022 CLINICAL DATA:  Neuro deficit, acute, stroke suspected EXAM: MRI HEAD WITHOUT CONTRAST TECHNIQUE: Multiplanar, multiecho pulse sequences of the brain and surrounding structures were obtained without intravenous contrast. COMPARISON:  None. FINDINGS: Brain: There is cortical/subcortical reduced diffusion in the right frontal lobe including involvement of lateral precentral gyrus. Patchy and confluent areas of T2 hyperintensity in the supratentorial white matter nonspecific but  probably reflect mild to moderate chronic microvascular ischemic changes. There are chronic right frontoparietal cortical infarcts. Small chronic infarct of the right frontal subcortical white matter. Prominent perivascular spaces and probable superimposed chronic small vessel infarcts of the central gray nuclei and white matter. Chronic blood products along the right basal ganglia and adjacent white matter. No intracranial mass or mass effect. There is no hydrocephalus or extra-axial fluid collection. Prominence of the ventricles and sulci reflects mild parenchymal volume loss. Vascular: Diminished right ICA flow void. Skull and upper cervical spine: Marrow signal is mildly heterogeneous but otherwise unremarkable. Sinuses/Orbits: Mild mucosal thickening.  Orbits are unremarkable. Other: Sella is unremarkable.  Mastoid air cells are clear. IMPRESSION: Small acute cortical/subcortical right frontal infarcts with involvement of lateral precentral gyrus. 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 Electronically Signed   By: Macy Mis M.D.   On: 01/10/2022 09:41   IR INTRAVSC STENT CERV CAROTID W/EMB-PROT MOD SED  Result Date: 01/13/2022 INDICATION: JHOAN SCHMIEDER is a 76 year old male with a past medical history significant for hypertension and chronic leukemia who presented to the [facility] ED on Monday with new onset left sided facial droop, drooling and pocketing of food in the left side of his mouth while eating. CT angiogram of the head and neck showed right ICA occlusion with intracranial reconstitution and left ICA severe stenosis. MRI of the brain was positive for small right MCA territory infarcts. He comes to our service today for a diagnostic cerebral angiogram to confirm CT angiogram findings with possible left carotid stenting in case right ICA chronic occlusion is confirmed. EXAM: ULTRASOUND-GUIDED VASCULAR ACCESS DIAGNOSTIC CEREBRAL ANGIOGRAM LEFT CAROTID STENTING  WITH  CEREBRAL PROTECTION DEVICE COMPARISON:  CT/CT angiogram of the head and neck January 09, 2022. MEDICATIONS: 5000 units of heparin intravenously ANESTHESIA/SEDATION: The procedure was performed under monitored anesthesia care (MAC). CONTRAST:  150 mL Omnipaque 300 milligram/mL FLUOROSCOPY: Radiation Exposure Index (as provided by the fluoroscopic device): 106.2 mGy Kerma COMPLICATIONS: None immediate. TECHNIQUE: Informed written consent was obtained from the patient after a thorough discussion of the procedural risks, benefits and alternatives. All questions were addressed. Maximal Sterile Barrier Technique was utilized including caps, mask, sterile gowns, sterile gloves, sterile drape, hand hygiene and skin antiseptic. A timeout was performed prior to the initiation of the procedure. The right groin was prepped and draped in the usual sterile fashion. The right groin region was infiltrated with lidocaine 1%. Using a micropuncture kit and the modified Seldinger technique, access was gained to the right common femoral artery and 5 French sheath was placed. Real-time ultrasound guidance was utilized for vascular access including the acquisition of a permanent ultrasound image documenting patency of the accessed vessel. Under fluoroscopy, a 4 [place] Berenstein 2 catheter was navigated over a 0.035" Terumo Glidewire into the aortic arch. The catheter was placed into the right common carotid artery. Frontal, lateral and bilateral oblique angiograms of the neck were obtained followed by frontal and lateral angiograms of the head. The catheter was then placed into the left subclavian artery. Frontal angiograms of the neck were obtained. The catheter was then advanced into the left vertebral artery. Frontal and lateral angiograms of the head were obtained. The catheter was then navigated into the left common carotid artery. Frontal and lateral angiograms of the neck were obtained. However, the catheter retracted backed into  the aortic arch during injection due to tortuosity. The catheter was then exchanged over the wire and under fluoroscopy for a 5 French Simmons 2 glide catheter. The catheter tip was reformed in the aortic arch and placed into the left common carotid artery. Frontal, lateral and bilateral oblique angiograms of the neck were obtained followed by frontal and lateral angiograms of the head. FINDINGS: 1. Right common femoral artery ultrasound: Normal caliber of the right common femoral artery, adequate for vascular access. 2. 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 Anastomosis of the left middle meningeal artery to left anterior cerebral artery in the parietal region is also noted. 3. Left subclavian artery angiogram: Increased tortuosity of the left subclavian artery without hemodynamically significant stenosis. Mild atherosclerotic changes at the proximal right vertebral artery without hemodynamically significant stenosis. 4. Left vertebral artery angiograms: Atherosclerotic changes at the distal left vertebral artery resulting in approximately 60% stenosis near the vertebrobasilar junction. Atherosclerotic changes of the basilar and bilateral posterior cerebral arteries. Degree of stenosis is difficult to assess due to patient's motion. Collateral circulation from the right PCA to the right ACA via posterior pericallosal arcade and to the ACA and MCA territory via leptomeningeal collaterals and diminutive PCOMs. 5. Left common carotid artery angiograms: Increased tortuosity of the proximal left common carotid artery. Atherosclerotic changes in the left carotid bifurcation resulting in approximately 65% stenosis. Contrast opacification of the left MCA and ACA vascular  tree seen with faint contrast opacification of the right MCA and ACA vascular tree via anterior communicating artery. Atherosclerotic changes of the left carotid siphon, left M1-M2/MCA segments  and left A2-A3/ACA segment without hemodynamically significant stenosis. PROCEDURE: Frontal and lateral angiograms of the neck were obtained and utilized as biplane roadmap. The 5 [place] Sim 2 glide catheter was advanced into the left external carotid artery. Under fluoroscopy, the catheter and the femoral sheath were removed over a 0.035 inch Terumo Glidewire. Then, a 6 [place] shuttle sheath was advanced into the distal left common carotid artery. Frontal and lateral angiograms of the neck were obtained and utilized as biplane roadmap. Next, a 2.5-4.8 mm Emboshield NAV 6 cerebral protection device was navigated into the distal cervical segment of the left ICA. Then, a 8-6 x 40 mm XACT carotid stent was deployed across the left carotid bifurcation, covering the area of stenosis. Subsequently, a 5 x 30 mm Viatrac balloon was navigated into the recently deployed stent. Angioplasty was performed under fluoroscopy. Patient developed transient bradycardia rapidly resolved with balloon deflation. The cerebral protection device was then recaptured. Frontal and lateral angiograms of the neck showed adequate stent position with resolution of stenosis. Angiograms with frontal and lateral views of the head showed no evidence of thromboembolic complication and modest improvement of the right MCA and ACA perfusion via anterior communicating artery. Delayed angiograms with frontal and lateral views of the neck showed no evidence of in stent clot formation. The shuttle sheath was then retracted into the right common femoral artery. Angiograms were obtained with right anterior oblique and lateral views. The puncture is at the level of the mid right common femoral artery. The artery has normal caliber, adequate for closure device. The shuttle  sheath was exchanged over the wire for a Perclose prostyle which was utilized for access closure. Immediate hemostasis was achieved. IMPRESSION: 1. Successful left carotid bifurcation stenting and angioplasty with cerebral protection device. No evidence of thromboembolic complication. 2. 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 Right anterior circulation is also supplied by posterior circulation via posterior pericallosal arcade, diminutive posterior communicating artery and leptomeningeal collaterals. 3. 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 PLAN: 1. Patient to continue on dual anti-platelet therapy with Brilinta 90 mg b.i.d. and aspirin 81 mg q.d. 2. A carotid duplex will be obtained at three-month postprocedure and dual anti-platelet regimen will be re-evaluated at this point. Electronically Signed   By: [name] M.D.   On: 01/13/2022 09:47   IR US Guide Vasc Access Right  Result Date: 01/13/2022 INDICATION: TERRYL MOLINELLI is a 76 year old male with a past medical history significant for hypertension and chronic leukemia who presented to the [facility] ED on Monday with new onset left sided facial droop, drooling and pocketing of food in the left side of his mouth while eating. CT angiogram of the head and neck showed right ICA occlusion with intracranial reconstitution and left ICA severe stenosis. MRI of the brain was positive for small right MCA territory infarcts. He comes to our service today for a diagnostic cerebral angiogram to confirm CT angiogram findings with possible left carotid stenting in case right ICA chronic occlusion is confirmed. EXAM: ULTRASOUND-GUIDED VASCULAR ACCESS DIAGNOSTIC CEREBRAL ANGIOGRAM LEFT CAROTID STENTING WITH CEREBRAL PROTECTION DEVICE COMPARISON:  CT/CT angiogram of the head and neck January 09, 2022. MEDICATIONS: 5000 units  of heparin intravenously ANESTHESIA/SEDATION: The procedure was performed under monitored anesthesia care (MAC). CONTRAST:  150 mL Omnipaque 300 milligram/mL FLUOROSCOPY: Radiation Exposure Index (as provided by the fluoroscopic device): 676.1 mGy Kerma COMPLICATIONS: None  immediate. TECHNIQUE: Informed written consent was obtained from the patient after a thorough discussion of the procedural risks, benefits and alternatives. All questions were addressed. Maximal Sterile Barrier Technique was utilized including caps, mask, sterile gowns, sterile gloves, sterile drape, hand hygiene and skin antiseptic. A timeout was performed prior to the initiation of the procedure. The right groin was prepped and draped in the usual sterile fashion. The right groin region was infiltrated with lidocaine 1%. Using a micropuncture kit and the modified Seldinger technique, access was gained to the right common femoral artery and 5 French sheath was placed. Real-time ultrasound guidance was utilized for vascular access including the acquisition of a permanent ultrasound image documenting patency of the accessed vessel. Under fluoroscopy, a 4 [place] Berenstein 2 catheter was navigated over a 0.035" Terumo Glidewire into the aortic arch. The catheter was placed into the right common carotid artery. Frontal, lateral and bilateral oblique angiograms of the neck were obtained followed by frontal and lateral angiograms of the head. The catheter was then placed into the left subclavian artery. Frontal angiograms of the neck were obtained. The catheter was then advanced into the left vertebral artery. Frontal and lateral angiograms of the head were obtained. The catheter was then navigated into the left common carotid artery. Frontal and lateral angiograms of the neck were obtained. However, the catheter retracted backed into the aortic arch during injection due to tortuosity. The catheter was then exchanged over the wire and under fluoroscopy  for a 5 French Simmons 2 glide catheter. The catheter tip was reformed in the aortic arch and placed into the left common carotid artery. Frontal, lateral and bilateral oblique angiograms of the neck were obtained followed by frontal and lateral angiograms of the head. FINDINGS: 1. Right common femoral artery ultrasound: Normal caliber of the right common femoral artery, adequate for vascular access. 2. 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 Anastomosis of the left middle meningeal artery to left anterior cerebral artery in the parietal region is also noted. 3. Left subclavian artery angiogram: Increased tortuosity of the left subclavian artery without hemodynamically significant stenosis. Mild atherosclerotic changes at the proximal right vertebral artery without hemodynamically significant stenosis. 4. Left vertebral artery angiograms: Atherosclerotic changes at the distal left vertebral artery resulting in approximately 60% stenosis near the vertebrobasilar junction. Atherosclerotic changes of the basilar and bilateral posterior cerebral arteries. Degree of stenosis is difficult to assess due to patient's motion. Collateral circulation from the right PCA to the right ACA via posterior pericallosal arcade and to the ACA and MCA territory via leptomeningeal collaterals and diminutive PCOMs. 5. Left common carotid artery angiograms: Increased tortuosity of the proximal left common carotid artery. Atherosclerotic changes in the left carotid bifurcation resulting in approximately 65% stenosis. Contrast opacification of the left MCA and ACA vascular tree seen with faint contrast opacification of the right MCA and ACA vascular tree via anterior communicating artery.  Atherosclerotic changes of the left carotid siphon, left M1-M2/MCA segments and left A2-A3/ACA segment without hemodynamically significant stenosis. PROCEDURE: Frontal and lateral angiograms of the neck were obtained and utilized as biplane roadmap. The 5 [place] Sim 2 glide catheter was advanced into the left external carotid artery. Under fluoroscopy, the catheter and the femoral sheath were removed over a 0.035 inch Terumo Glidewire. Then,  a 6 [place] shuttle sheath was advanced into the distal left common carotid artery. Frontal and lateral angiograms of the neck were obtained and utilized as biplane roadmap. Next, a 2.5-4.8 mm Emboshield NAV 6 cerebral protection device was navigated into the distal cervical segment of the left ICA. Then, a 8-6 x 40 mm XACT carotid stent was deployed across the left carotid bifurcation, covering the area of stenosis. Subsequently, a 5 x 30 mm Viatrac balloon was navigated into the recently deployed stent. Angioplasty was performed under fluoroscopy. Patient developed transient bradycardia rapidly resolved with balloon deflation. The cerebral protection device was then recaptured. Frontal and lateral angiograms of the neck showed adequate stent position with resolution of stenosis. Angiograms with frontal and lateral views of the head showed no evidence of thromboembolic complication and modest improvement of the right MCA and ACA perfusion via anterior communicating artery. Delayed angiograms with frontal and lateral views of the neck showed no evidence of in stent clot formation. The shuttle sheath was then retracted into the right common femoral artery. Angiograms were obtained with right anterior oblique and lateral views. The puncture is at the level of the mid right common femoral artery. The artery has normal caliber, adequate for closure device. The shuttle sheath was exchanged over the wire for a Perclose prostyle which was utilized for access closure. Immediate  hemostasis was achieved. IMPRESSION: 1. Successful left carotid bifurcation stenting and angioplasty with cerebral protection device. No evidence of thromboembolic complication. 2. 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 Right anterior circulation is also supplied by posterior circulation via posterior pericallosal arcade, diminutive posterior communicating artery and leptomeningeal collaterals. 3. 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 PLAN: 1. Patient to continue on dual anti-platelet therapy with Brilinta 90 mg b.i.d. and aspirin 81 mg q.d. 2. A carotid duplex will be obtained at three-month postprocedure and dual anti-platelet regimen will be re-evaluated at this point. Electronically Signed   By: [name] M.D.   On: 01/13/2022 09:47   ECHOCARDIOGRAM COMPLETE  Result Date: 01/10/2022    ECHOCARDIOGRAM REPORT   Patient Name:   AZAR SOUTH Date of Exam: 01/10/2022 Medical Rec #:  [id number]       Height:       69.0 in Accession #:    [id number]      Weight:       170.2 lb Date of Birth:  1946/03/04       BSA:          1.929 m Patient Age:    88 years        BP:           141/84 mmHg Patient Gender: M               HR:           81 bpm. Exam Location:  [facility] Procedure: 2D Echo, Cardiac Doppler and Color Doppler Indications:    Stroke I63.9  History:        Patient has no prior history of Echocardiogram examinations.                 Risk Factors:Hypertension. CVA (cerebral vascular accident)                 ([place]).  Sonographer:    Alvino Chapel RCS Referring Phys: [id number] ASIA B [place]  1. Left ventricular ejection  fraction, by estimation, is 60 to 65%. The left ventricle has normal function. The left ventricle has no regional wall motion abnormalities. Left ventricular diastolic parameters are consistent with Grade I diastolic dysfunction  (impaired relaxation).  2. Right ventricular systolic function is normal. The right ventricular size is normal. Tricuspid regurgitation signal is inadequate for assessing PA pressure.  3. The mitral valve is degenerative. Trivial mitral valve regurgitation. No evidence of mitral stenosis.  4. The aortic valve is tricuspid. Aortic valve regurgitation is trivial. Aortic valve sclerosis is present, with no evidence of aortic valve stenosis.  5. The inferior vena cava is normal in size with greater than 50% respiratory variability, suggesting right atrial pressure of 3 mmHg. Comparison(s): No prior Echocardiogram. FINDINGS  Left Ventricle: Left ventricular ejection fraction, by estimation, is 60 to 65%. The left ventricle has normal function. The left ventricle has no regional wall motion abnormalities. The left ventricular internal cavity size was normal in size. There is  no left ventricular hypertrophy. Left ventricular diastolic parameters are consistent with Grade I diastolic dysfunction (impaired relaxation). Right Ventricle: The right ventricular size is normal. No increase in right ventricular wall thickness. Right ventricular systolic function is normal. Tricuspid regurgitation signal is inadequate for assessing PA pressure. Left Atrium: Left atrial size was normal in size. Right Atrium: Right atrial size was normal in size. Pericardium: There is no evidence of pericardial effusion. Mitral Valve: The mitral valve is degenerative in appearance. Trivial mitral valve regurgitation. No evidence of mitral valve stenosis. Tricuspid Valve: The tricuspid valve is normal in structure. Tricuspid valve regurgitation is trivial. No evidence of tricuspid stenosis. Aortic Valve: The aortic valve is tricuspid. Aortic valve regurgitation is trivial. Aortic valve sclerosis is present, with no evidence of aortic valve stenosis. Aortic valve mean gradient measures 12.0 mmHg. Aortic valve peak gradient measures 20.6 mmHg. Aortic  valve area, by VTI measures 1.42 cm. Pulmonic Valve: The pulmonic valve was not well visualized. Pulmonic valve regurgitation is not visualized. No evidence of pulmonic stenosis. Aorta: The aortic root is normal in size and structure. Venous: The inferior vena cava is normal in size with greater than 50% respiratory variability, suggesting right atrial pressure of 3 mmHg. IAS/Shunts: No atrial level shunt detected by color flow Doppler.  LEFT VENTRICLE PLAX 2D LVIDd:         4.20 cm   Diastology LVIDs:         2.80 cm   LV e' medial:    7.94 cm/s LV PW:         1.00 cm   LV E/e' medial:  11.6 LV IVS:        0.90 cm   LV e' lateral:   5.98 cm/s LVOT diam:     1.90 cm   LV E/e' lateral: 15.5 LV SV:         67 LV SV Index:   35 LVOT Area:     2.84 cm  RIGHT VENTRICLE RV S prime:     12.10 cm/s TAPSE (M-mode): 2.1 cm LEFT ATRIUM             Index        RIGHT ATRIUM           Index LA diam:        3.60 cm 1.87 cm/m   RA Area:     15.70 cm LA Vol (A2C):   62.3 ml 32.30 ml/m  RA Volume:   40.20 ml  20.84 ml/m LA  Vol (A4C):   45.2 ml 23.43 ml/m LA Biplane Vol: 55.0 ml 28.51 ml/m  AORTIC VALVE AV Area (Vmax):    1.44 cm AV Area (Vmean):   1.28 cm AV Area (VTI):     1.42 cm AV Vmax:           227.00 cm/s AV Vmean:          160.000 cm/s AV VTI:            0.470 m AV Peak Grad:      20.6 mmHg AV Mean Grad:      12.0 mmHg LVOT Vmax:         115.00 cm/s LVOT Vmean:        72.000 cm/s LVOT VTI:          0.235 m LVOT/AV VTI ratio: 0.50  AORTA Ao Root diam: 3.10 cm MITRAL VALVE MV Area (PHT): 3.37 cm     SHUNTS MV Decel Time: 225 msec     Systemic VTI:  0.24 m MV E velocity: 92.50 cm/s   Systemic Diam: 1.90 cm MV A velocity: 111.00 cm/s MV E/A ratio:  0.83 Rudean Haskell MD Electronically signed by Rudean Haskell MD Signature Date/Time: 01/10/2022/12:08:44 PM    Final    IR ANGIO EXTRACRAN SEL COM CAROTID INNOMINATE UNI BILAT MOD SED  Result Date: 01/13/2022 INDICATION: DAIRE OKIMOTO is a 76 year old  male with a past medical history significant for hypertension and chronic leukemia who presented to the [facility] ED on Monday with new onset left sided facial droop, drooling and pocketing of food in the left side of his mouth while eating. CT angiogram of the head and neck showed right ICA occlusion with intracranial reconstitution and left ICA severe stenosis. MRI of the brain was positive for small right MCA territory infarcts. He comes to our service today for a diagnostic cerebral angiogram to confirm CT angiogram findings with possible left carotid stenting in case right ICA chronic occlusion is confirmed. EXAM: ULTRASOUND-GUIDED VASCULAR ACCESS DIAGNOSTIC CEREBRAL ANGIOGRAM LEFT CAROTID STENTING WITH CEREBRAL PROTECTION DEVICE COMPARISON:  CT/CT angiogram of the head and neck January 09, 2022. MEDICATIONS: 5000 units of heparin intravenously ANESTHESIA/SEDATION: The procedure was performed under monitored anesthesia care (MAC). CONTRAST:  150 mL Omnipaque 300 milligram/mL FLUOROSCOPY: Radiation Exposure Index (as provided by the fluoroscopic device): 093.2 mGy Kerma COMPLICATIONS: None immediate. TECHNIQUE: Informed written consent was obtained from the patient after a thorough discussion of the procedural risks, benefits and alternatives. All questions were addressed. Maximal Sterile Barrier Technique was utilized including caps, mask, sterile gowns, sterile gloves, sterile drape, hand hygiene and skin antiseptic. A timeout was performed prior to the initiation of the procedure. The right groin was prepped and draped in the usual sterile fashion. The right groin region was infiltrated with lidocaine 1%. Using a micropuncture kit and the modified Seldinger technique, access was gained to the right common femoral artery and 5 French sheath was placed. Real-time ultrasound guidance was utilized for vascular access including the acquisition of a permanent ultrasound image documenting patency of the accessed  vessel. Under fluoroscopy, a 4 [place] Berenstein 2 catheter was navigated over a 0.035" Terumo Glidewire into the aortic arch. The catheter was placed into the right common carotid artery. Frontal, lateral and bilateral oblique angiograms of the neck were obtained followed by frontal and lateral angiograms of the head. The catheter was then placed into the left subclavian artery. Frontal angiograms of the neck were obtained. The catheter was  then advanced into the left vertebral artery. Frontal and lateral angiograms of the head were obtained. The catheter was then navigated into the left common carotid artery. Frontal and lateral angiograms of the neck were obtained. However, the catheter retracted backed into the aortic arch during injection due to tortuosity. The catheter was then exchanged over the wire and under fluoroscopy for a 5 French Simmons 2 glide catheter. The catheter tip was reformed in the aortic arch and placed into the left common carotid artery. Frontal, lateral and bilateral oblique angiograms of the neck were obtained followed by frontal and lateral angiograms of the head. FINDINGS: 1. Right common femoral artery ultrasound: Normal caliber of the right common femoral artery, adequate for vascular access. 2. 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 Anastomosis of the left middle meningeal artery to left anterior cerebral artery in the parietal region is also noted. 3. Left subclavian artery angiogram: Increased tortuosity of the left subclavian artery without hemodynamically significant stenosis. Mild atherosclerotic changes at the proximal right vertebral artery without hemodynamically significant stenosis. 4. Left vertebral artery angiograms:  Atherosclerotic changes at the distal left vertebral artery resulting in approximately 60% stenosis near the vertebrobasilar junction. Atherosclerotic changes of the basilar and bilateral posterior cerebral arteries. Degree of stenosis is difficult to assess due to patient's motion. Collateral circulation from the right PCA to the right ACA via posterior pericallosal arcade and to the ACA and MCA territory via leptomeningeal collaterals and diminutive PCOMs. 5. Left common carotid artery angiograms: Increased tortuosity of the proximal left common carotid artery. Atherosclerotic changes in the left carotid bifurcation resulting in approximately 65% stenosis. Contrast opacification of the left MCA and ACA vascular tree seen with faint contrast opacification of the right MCA and ACA vascular tree via anterior communicating artery. Atherosclerotic changes of the left carotid siphon, left M1-M2/MCA segments and left A2-A3/ACA segment without hemodynamically significant stenosis. PROCEDURE: Frontal and lateral angiograms of the neck were obtained and utilized as biplane roadmap. The 5 [place] Sim 2 glide catheter was advanced into the left external carotid artery. Under fluoroscopy, the catheter and the femoral sheath were removed over a 0.035 inch Terumo Glidewire. Then, a 6 [place] shuttle sheath was advanced into the distal left common carotid artery. Frontal and lateral angiograms of the neck were obtained and utilized as biplane roadmap. Next, a 2.5-4.8 mm Emboshield NAV 6 cerebral protection device was navigated into the distal cervical segment of the left ICA. Then, a 8-6 x 40 mm XACT carotid stent was deployed across the left carotid bifurcation, covering the area of stenosis. Subsequently, a 5 x 30 mm Viatrac balloon was navigated into the recently deployed stent. Angioplasty was performed under fluoroscopy. Patient developed transient bradycardia rapidly resolved with balloon deflation. The cerebral protection  device was then recaptured. Frontal and lateral angiograms of the neck showed adequate stent position with resolution of stenosis. Angiograms with frontal and lateral views of the head showed no evidence of thromboembolic complication and modest improvement of the right MCA and ACA perfusion via anterior communicating artery. Delayed angiograms with frontal and lateral views of the neck showed no evidence of in stent clot formation. The shuttle sheath was then retracted into the right common femoral artery. Angiograms were obtained with right anterior oblique and lateral views. The puncture  is at the level of the mid right common femoral artery. The artery has normal caliber, adequate for closure device. The shuttle sheath was exchanged over the wire for a Perclose prostyle which was utilized for access closure. Immediate hemostasis was achieved. IMPRESSION: 1. Successful left carotid bifurcation stenting and angioplasty with cerebral protection device. No evidence of thromboembolic complication. 2. 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 Right anterior circulation is also supplied by posterior circulation via posterior pericallosal arcade, diminutive posterior communicating artery and leptomeningeal collaterals. 3. 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 PLAN: 1. Patient to continue on dual anti-platelet therapy with Brilinta 90 mg b.i.d. and aspirin 81 mg q.d. 2. A carotid duplex will be obtained at three-month postprocedure and dual anti-platelet regimen will be re-evaluated at this point. Electronically Signed   By: [name] M.D.   On: 01/13/2022 09:47   IR ANGIO VERTEBRAL SEL VERTEBRAL UNI L MOD SED  Result Date: 01/13/2022 INDICATION: JOSHIA KITCHINGS is a 76 year old male with a past medical history significant for hypertension and chronic leukemia who  presented to the [facility] ED on Monday with new onset left sided facial droop, drooling and pocketing of food in the left side of his mouth while eating. CT angiogram of the head and neck showed right ICA occlusion with intracranial reconstitution and left ICA severe stenosis. MRI of the brain was positive for small right MCA territory infarcts. He comes to our service today for a diagnostic cerebral angiogram to confirm CT angiogram findings with possible left carotid stenting in case right ICA chronic occlusion is confirmed. EXAM: ULTRASOUND-GUIDED VASCULAR ACCESS DIAGNOSTIC CEREBRAL ANGIOGRAM LEFT CAROTID STENTING WITH CEREBRAL PROTECTION DEVICE COMPARISON:  CT/CT angiogram of the head and neck January 09, 2022. MEDICATIONS: 5000 units of heparin intravenously ANESTHESIA/SEDATION: The procedure was performed under monitored anesthesia care (MAC). CONTRAST:  150 mL Omnipaque 300 milligram/mL FLUOROSCOPY: Radiation Exposure Index (as provided by the fluoroscopic device): 001.7 mGy Kerma COMPLICATIONS: None immediate. TECHNIQUE: Informed written consent was obtained from the patient after a thorough discussion of the procedural risks, benefits and alternatives. All questions were addressed. Maximal Sterile Barrier Technique was utilized including caps, mask, sterile gowns, sterile gloves, sterile drape, hand hygiene and skin antiseptic. A timeout was performed prior to the initiation of the procedure. The right groin was prepped and draped in the usual sterile fashion. The right groin region was infiltrated with lidocaine 1%. Using a micropuncture kit and the modified Seldinger technique, access was gained to the right common femoral artery and 5 French sheath was placed. Real-time ultrasound guidance was utilized for vascular access including the acquisition of a permanent ultrasound image documenting patency of the accessed vessel. Under fluoroscopy, a 4 [place] Berenstein 2 catheter was navigated over a 0.035"  Terumo Glidewire into the aortic arch. The catheter was placed into the right common carotid artery. Frontal, lateral and bilateral oblique angiograms of the neck were obtained followed by frontal and lateral angiograms of the head. The catheter was then placed into the left subclavian artery. Frontal angiograms of the neck were obtained. The catheter was then advanced into the left vertebral artery. Frontal and lateral angiograms of the head were obtained. The catheter was then navigated into the left common carotid artery. Frontal and lateral angiograms of the neck were obtained. However, the catheter retracted backed into the aortic arch during injection due to tortuosity. The catheter  was then exchanged over the wire and under fluoroscopy for a 5 French Simmons 2 glide catheter. The catheter tip was reformed in the aortic arch and placed into the left common carotid artery. Frontal, lateral and bilateral oblique angiograms of the neck were obtained followed by frontal and lateral angiograms of the head. FINDINGS: 1. Right common femoral artery ultrasound: Normal caliber of the right common femoral artery, adequate for vascular access. 2. 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 Anastomosis of the left middle meningeal artery to left anterior cerebral artery in the parietal region is also noted. 3. Left subclavian artery angiogram: Increased tortuosity of the left subclavian artery without hemodynamically significant stenosis. Mild atherosclerotic changes at the proximal right vertebral artery without hemodynamically significant stenosis. 4. Left vertebral artery angiograms: Atherosclerotic changes at the distal left vertebral artery resulting in approximately 60%  stenosis near the vertebrobasilar junction. Atherosclerotic changes of the basilar and bilateral posterior cerebral arteries. Degree of stenosis is difficult to assess due to patient's motion. Collateral circulation from the right PCA to the right ACA via posterior pericallosal arcade and to the ACA and MCA territory via leptomeningeal collaterals and diminutive PCOMs. 5. Left common carotid artery angiograms: Increased tortuosity of the proximal left common carotid artery. Atherosclerotic changes in the left carotid bifurcation resulting in approximately 65% stenosis. Contrast opacification of the left MCA and ACA vascular tree seen with faint contrast opacification of the right MCA and ACA vascular tree via anterior communicating artery. Atherosclerotic changes of the left carotid siphon, left M1-M2/MCA segments and left A2-A3/ACA segment without hemodynamically significant stenosis. PROCEDURE: Frontal and lateral angiograms of the neck were obtained and utilized as biplane roadmap. The 5 [place] Sim 2 glide catheter was advanced into the left external carotid artery. Under fluoroscopy, the catheter and the femoral sheath were removed over a 0.035 inch Terumo Glidewire. Then, a 6 [place] shuttle sheath was advanced into the distal left common carotid artery. Frontal and lateral angiograms of the neck were obtained and utilized as biplane roadmap. Next, a 2.5-4.8 mm Emboshield NAV 6 cerebral protection device was navigated into the distal cervical segment of the left ICA. Then, a 8-6 x 40 mm XACT carotid stent was deployed across the left carotid bifurcation, covering the area of stenosis. Subsequently, a 5 x 30 mm Viatrac balloon was navigated into the recently deployed stent. Angioplasty was performed under fluoroscopy. Patient developed transient bradycardia rapidly resolved with balloon deflation. The cerebral protection device was then recaptured. Frontal and lateral angiograms of the neck showed adequate stent  position with resolution of stenosis. Angiograms with frontal and lateral views of the head showed no evidence of thromboembolic complication and modest improvement of the right MCA and ACA perfusion via anterior communicating artery. Delayed angiograms with frontal and lateral views of the neck showed no evidence of in stent clot formation. The shuttle sheath was then retracted into the right common femoral artery. Angiograms were obtained with right anterior oblique and lateral views. The puncture is at the level of the mid right common femoral artery. The artery has normal caliber, adequate for closure device. The shuttle sheath was exchanged over the wire for a Perclose prostyle which was utilized for access closure. Immediate hemostasis was achieved. IMPRESSION: 1. Successful left carotid bifurcation stenting and angioplasty with  cerebral protection device. No evidence of thromboembolic complication. 2. 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 Right anterior circulation is also supplied by posterior circulation via posterior pericallosal arcade, diminutive posterior communicating artery and leptomeningeal collaterals. 3. 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 PLAN: 1. Patient to continue on dual anti-platelet therapy with Brilinta 90 mg b.i.d. and aspirin 81 mg q.d. 2. A carotid duplex will be obtained at three-month postprocedure and dual anti-platelet regimen will be re-evaluated at this point. Electronically Signed   By: [name] M.D.   On: 01/13/2022 09:47      Subjective: Patient seen and examined at the bedside this morning.  Hemodynamically stable for discharge today  Discharge Exam: Vitals:   01/13/22 0700 01/13/22 0800  BP: 118/63   Pulse: (!) 58   Resp: 20   Temp:  98.1 F (36.7 C)  SpO2: 97%    Vitals:   01/13/22 0630 01/13/22 0645  01/13/22 0700 01/13/22 0800  BP: (!) 174/73 115/79 118/63   Pulse: 69 (!) 58 (!) 58   Resp: _0 Temp:    98.1 F (36.7 C)  TempSrc:    Oral  SpO2: 98% 95% 97%   Weight:      Height:        General: Pt is alert, awake, not in acute distress Cardiovascular: RRR, S1/S2 +, no rubs, no gallops Respiratory: CTA bilaterally, no wheezing, no rhonchi Abdominal: Soft, NT, ND, bowel sounds + Extremities: no edema, no cyanosis    The results of significant diagnostics from this hospitalization (including imaging, microbiology, ancillary and laboratory) are listed below for reference.     Microbiology: Recent Results (from the past 240 hour(s))  Resp Panel by RT-PCR (Flu A&B, Covid) Nasopharyngeal Swab     Status: None   Collection Time: 01/09/22  4:40 PM   Specimen: Nasopharyngeal Swab; Nasopharyngeal(NP) swabs in vial transport medium  Result Value Ref Range Status   SARS Coronavirus 2 by RT PCR NEGATIVE NEGATIVE Final    Comment: (NOTE) SARS-CoV-2 target nucleic acids are NOT DETECTED.  The SARS-CoV-2 RNA is generally detectable in upper respiratory specimens during the acute phase of infection. The lowest concentration of SARS-CoV-2 viral copies this assay can detect is 138 copies/mL. A negative result does not preclude SARS-Cov-2 infection and should not be used as the sole basis for treatment or other patient management decisions. A negative result may occur with  improper specimen collection/handling, submission of specimen other than nasopharyngeal swab, presence of viral mutation(s) within the areas targeted by this assay, and inadequate number of viral copies(<138 copies/mL). A negative result must be combined with clinical observations, patient history, and epidemiological information. The expected result is Negative.  Fact Sheet for Patients:  EntrepreneurPulse.com.au  Fact Sheet for Healthcare Providers:   IncredibleEmployment.be  This test is no t yet approved or cleared by the [place] FDA and  has been authorized for detection and/or diagnosis of SARS-CoV-2 by FDA under an Emergency Use Authorization (EUA). This EUA will remain  in effect (meaning this test can be used) for the duration of the COVID-19 declaration under Section 564(b)(1) of the Act, 21 U.S.C.section 360bbb-3(b)(1), unless the authorization is terminated  or revoked sooner.       Influenza A by PCR NEGATIVE NEGATIVE Final   Influenza B by PCR NEGATIVE NEGATIVE Final    Comment: (NOTE) The Xpert  Xpress SARS-CoV-2/FLU/RSV plus assay is intended as an aid in the diagnosis of influenza from Nasopharyngeal swab specimens and should not be used as a sole basis for treatment. Nasal washings and aspirates are unacceptable for Xpert Xpress SARS-CoV-2/FLU/RSV testing.  Fact Sheet for Patients: EntrepreneurPulse.com.au  Fact Sheet for Healthcare Providers: IncredibleEmployment.be  This test is not yet approved or cleared by the [place] FDA and has been authorized for detection and/or diagnosis of SARS-CoV-2 by FDA under an Emergency Use Authorization (EUA). This EUA will remain in effect (meaning this test can be used) for the duration of the COVID-19 declaration under Section 564(b)(1) of the Act, 21 U.S.C. section 360bbb-3(b)(1), unless the authorization is terminated or revoked.  Performed at [facility], [address], [place], [place] [address]   Surgical pcr screen     Status: Abnormal   Collection Time: 01/12/22  9:37 AM   Specimen: Nasal Mucosa; Nasal Swab  Result Value Ref Range Status   MRSA, PCR NEGATIVE NEGATIVE Final   Staphylococcus aureus POSITIVE (A) NEGATIVE Final    Comment: (NOTE) The Xpert SA Assay (FDA approved for NASAL specimens in patients 73 years of age and older), is one component of a comprehensive surveillance program.  It is not intended to diagnose infection nor to guide or monitor treatment. Performed at [facility], [place] [address], [place], [place] [address]      Labs: BNP (last 3 results) No results for input(s): BNP in the last 8760 hours. Basic Metabolic Panel: Recent Labs  Lab 01/09/22 1620 01/10/22 0410 01/12/22 0201 01/13/22 0352  NA 140 141 138 137  K 3.4* 4.0 4.2 4.1  CL 104 108 106 107  CO2 _0 GLUCOSE 88 90 93 92  BUN _1 CREATININE 1.28* 1.17 1.28* 1.33*  CALCIUM 9.4 9.0 9.5 8.9  MG  --  1.9  --   --    Liver Function Tests: Recent Labs  Lab 01/10/22 0410  AST 17  ALT 13  ALKPHOS 60  BILITOT 0.4  PROT 6.5  ALBUMIN 3.5   No results for input(s): LIPASE, AMYLASE in the last 168 hours. No results for input(s): AMMONIA in the last 168 hours. CBC: Recent Labs  Lab 01/09/22 1620 01/10/22 0410 01/12/22 0201 01/13/22 0352  WBC 40.0* 40.4* 39.6* 49.5*  NEUTROABS 3.9  --   --   --   HGB 13.0 12.7* 12.8* 11.8*  HCT 41.5 41.7 39.9 37.4*  MCV 97.2 97.9 93.0 94.9  PLT 182 150 168 198   Cardiac Enzymes: No results for input(s): CKTOTAL, CKMB, CKMBINDEX, TROPONINI in the last 168 hours. BNP: Invalid input(s): POCBNP CBG: No results for input(s): GLUCAP in the last 168 hours. D-Dimer No results for input(s): DDIMER in the last 72 hours. Hgb A1c No results for input(s): HGBA1C in the last 72 hours. Lipid Profile No results for input(s): CHOL, HDL, LDLCALC, TRIG, CHOLHDL, LDLDIRECT in the last 72 hours. Thyroid function studies Recent Labs    01/11/22 0222  TSH 2.632   Anemia work up No results for input(s): VITAMINB12, FOLATE, FERRITIN, TIBC, IRON, RETICCTPCT in the last 72 hours. Urinalysis No results found for: COLORURINE, APPEARANCEUR, [place], [place], GLUCOSEU, [place], [place], [place], PROTEINUR, UROBILINOGEN, NITRITE, LEUKOCYTESUR Sepsis Labs Invalid input(s): PROCALCITONIN,  WBC,  LACTICIDVEN Microbiology Recent Results  (from the past 240 hour(s))  Resp Panel by RT-PCR (Flu A&B, Covid) Nasopharyngeal Swab     Status: None   Collection Time: 01/09/22  4:40 PM   Specimen: Nasopharyngeal Swab; Nasopharyngeal(NP) swabs in vial transport medium  Result Value Ref Range Status   SARS Coronavirus 2 by RT PCR NEGATIVE NEGATIVE Final    Comment: (NOTE) SARS-CoV-2 target nucleic acids are NOT DETECTED.  The SARS-CoV-2 RNA is generally detectable in upper respiratory specimens during the acute phase of infection. The lowest concentration of SARS-CoV-2 viral copies this assay can detect is 138 copies/mL. A negative result does not preclude SARS-Cov-2 infection and should not be used as the sole basis for treatment or other patient management decisions. A negative result may occur with  improper specimen collection/handling, submission of specimen other than nasopharyngeal swab, presence of viral mutation(s) within the areas targeted by this assay, and inadequate number of viral copies(<138 copies/mL). A negative result must be combined with clinical observations, patient history, and epidemiological information. The expected result is Negative.  Fact Sheet for Patients:  EntrepreneurPulse.com.au  Fact Sheet for Healthcare Providers:  IncredibleEmployment.be  This test is no t yet approved or cleared by the [place] FDA and  has been authorized for detection and/or diagnosis of SARS-CoV-2 by FDA under an Emergency Use Authorization (EUA). This EUA will remain  in effect (meaning this test can be used) for the duration of the COVID-19 declaration under Section 564(b)(1) of the Act, 21 U.S.C.section 360bbb-3(b)(1), unless the authorization is terminated  or revoked sooner.       Influenza A by PCR NEGATIVE NEGATIVE Final   Influenza B by PCR NEGATIVE NEGATIVE Final    Comment: (NOTE) The Xpert Xpress SARS-CoV-2/FLU/RSV plus assay is intended as an aid in the  diagnosis of influenza from Nasopharyngeal swab specimens and should not be used as a sole basis for treatment. Nasal washings and aspirates are unacceptable for Xpert Xpress SARS-CoV-2/FLU/RSV testing.  Fact Sheet for Patients: EntrepreneurPulse.com.au  Fact Sheet for Healthcare Providers: IncredibleEmployment.be  This test is not yet approved or cleared by the [place] FDA and has been authorized for detection and/or diagnosis of SARS-CoV-2 by FDA under an Emergency Use Authorization (EUA). This EUA will remain in effect (meaning this test can be used) for the duration of the COVID-19 declaration under Section 564(b)(1) of the Act, 21 U.S.C. section 360bbb-3(b)(1), unless the authorization is terminated or revoked.  Performed at [facility], [address] [place], [place] [address]   Surgical pcr screen     Status: Abnormal   Collection Time: 01/12/22  9:37 AM   Specimen: Nasal Mucosa; Nasal Swab  Result Value Ref Range Status   MRSA, PCR NEGATIVE NEGATIVE Final   Staphylococcus aureus POSITIVE (A) NEGATIVE Final    Comment: (NOTE) The Xpert SA Assay (FDA approved for NASAL specimens in patients 61 years of age and older), is one component of a comprehensive surveillance program. It is not intended to diagnose infection nor to guide or monitor treatment. Performed at [facility], [place] [address], [place], [place] [address]     Please note: You were cared for by a hospitalist during your hospital stay. Once you are discharged, your primary care physician will handle any further medical issues. Please note that NO REFILLS for any discharge medications will be authorized once you are discharged, as it is imperative that you return to your primary care physician (or establish a relationship with a primary care physician if you do not have one) for your post hospital discharge needs so that they can reassess your need for medications  and monitor your lab values.  Time coordinating discharge: 40 minutes  SIGNED:   Shelly Coss, MD  [facility] 01/13/2022, 11:46 AM Pager [phone]  If 7PM-7AM, please contact night-coverage [email or web address] Password [email or web address]

## 2022-01-13 NOTE — Discharge Instructions (Signed)
Femoral Site Care This sheet gives you information about how to care for yourself after your procedure. Your health care provider may also give you more specific instructions. If you have problems or questions, contact your health care provider. What can I expect after the procedure? After the procedure, it is common to have: Bruising that usually fades within 1-2 weeks. Tenderness at the site. Follow these instructions at home: Wound care Follow instructions from your health care provider about how to take care of your insertion site. Make sure you: Wash your hands with soap and water before you change your bandage (dressing). If soap and water are not available, use hand sanitizer. Change your dressing as directed- pressure dressing removed 24 hours post-procedure (and switch for bandaid), bandaid removed 72 hours post-procedure Do not take baths, swim, or use a hot tub for 7 days post-procedure. You may shower 48 hours after the procedure or as told by your health care provider. Gently wash the site with plain soap and water. Pat the area dry with a clean towel. Do not rub the site. This may cause bleeding. Check your site every day for signs of infection. Check for: Redness, swelling, or pain. Fluid or blood. Warmth. Pus or a bad smell. Activity Do not stoop, bend, or lift anything that is heavier than 10 lb (4.5 kg) for 2 weeks post-procedure. Do not drive self for 2 weeks post-procedure. Contact a health care provider if you have: A fever or chills. You have redness, swelling, or pain around your insertion site. Get help right away if: The catheter insertion area swells very fast. You pass out. You suddenly start to sweat or your skin gets clammy. The catheter insertion area is bleeding, and the bleeding does not stop when you hold steady pressure on the area. The area near or just beyond the catheter insertion site becomes pale, cool, tingly, or numb. These symptoms may  represent a serious problem that is an emergency. Do not wait to see if the symptoms will go away. Get medical help right away. Call your local emergency services (911 in the [place] Do not drive yourself to the hospital.  This information is not intended to replace advice given to you by your health care provider. Make sure you discuss any questions you have with your health care provider. Document Revised: 12/03/2017 Document Reviewed: 12/03/2017 Elsevier Patient Education  2020 Elsevier Inc.  

## 2022-01-13 NOTE — Progress Notes (Signed)
Occupational Therapy Treatment Patient Details Name: Daniel Osborne MRN: [id number] DOB: 1946/11/06 Today's Date: 01/13/2022   History of present illness 76 y.o. 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 PMHx: chronic leukemia and hypertension, and dyslipidemia.  2/10: left carotid stent placed 2/9.   OT comments  Patient s/p stenting on 2/9, no functional changes to ADL or in room mobility status.  Patient believes his arm weakness and incoordination are improving.  Able to complete sit/stand lower body ADL, grooming task and in room mobility with setup and generalized supervision.  OT can continue efforts in the acute setting, and outpatient OT recommended.     Recommendations for follow up therapy are one component of a multi-disciplinary discharge planning process, led by the attending physician.  Recommendations may be updated based on patient status, additional functional criteria and insurance authorization.    Follow Up Recommendations  Outpatient OT    Assistance Recommended at Discharge    Patient can return home with the following  Assist for transportation;Assistance with cooking/housework   Equipment Recommendations  None recommended by OT    Recommendations for Other Services      Precautions / Restrictions Precautions Precautions: Fall Restrictions Weight Bearing Restrictions: No       Mobility Bed Mobility Overal bed mobility: Modified Independent                  Transfers Overall transfer level: Modified independent                 General transfer comment: first time seeing patient - no LOB noted, but did reach for objects in his environment for stability     Balance Overall balance assessment: Mild deficits observed, not formally  tested                                         ADL either performed or assessed with clinical judgement   ADL       Grooming: Set up;Sitting;Wash/dry hands;Wash/dry face           Upper Body Dressing : Set up;Sitting   Lower Body Dressing: Set up;Sit to/from stand   Toilet Transfer: Supervision/safety;Ambulation;Regular Toilet   Toileting- Clothing Manipulation and Hygiene: Modified independent;Sitting/lateral lean              Extremity/Trunk Assessment Upper Extremity Assessment LUE Deficits / Details: generally weaker than R. Decreased coordination. ROM is WFL. sensation is WNL.  Improving per patient.   Lower Extremity Assessment Lower Extremity Assessment: Defer to PT evaluation        Vision Baseline Vision/History: 0 No visual deficits;1 Wears glasses     Perception Perception Perception: Within Functional Limits   Praxis Praxis Praxis: Intact    Cognition Arousal/Alertness: Awake/alert Behavior During Therapy: WFL for tasks assessed/performed Overall Cognitive Status: Within Functional Limits for tasks assessed                                                       General Comments  VSS on RA  Pertinent Vitals/ Pain       Pain Assessment Pain Assessment: No/denies pain                                                          Frequency  Min 2X/week        Progress Toward Goals  OT Goals(current goals can now be found in the care plan section)  Progress towards OT goals: Progressing toward goals  Acute Rehab OT Goals Patient Stated Goal: home and go outside OT Goal Formulation: With patient Time For Goal Achievement: 01/25/22 Potential to Achieve Goals: Good  Plan Discharge plan remains appropriate    Co-evaluation                 AM-PAC OT "6 Clicks" Daily Activity     Outcome Measure   Help from another person eating meals?: None Help from another  person taking care of personal grooming?: None Help from another person toileting, which includes using toliet, bedpan, or urinal?: None Help from another person bathing (including washing, rinsing, drying)?: None Help from another person to put on and taking off regular upper body clothing?: None Help from another person to put on and taking off regular lower body clothing?: None 6 Click Score: 24    End of Session    OT Visit Diagnosis: Hemiplegia and hemiparesis   Activity Tolerance Patient tolerated treatment well   Patient Left in chair;with call bell/phone within reach   Nurse Communication          Time: 4818-5631 OT Time Calculation (min): 22 min  Charges: OT General Charges $OT Visit: 1 Visit OT Treatments $Self Care/Home Management : 8-22 mins  01/13/2022  RP, OTR/L  Acute Rehabilitation Services  Office:  [phone]   Metta Clines 01/13/2022, 8:44 AM

## 2022-01-13 NOTE — Progress Notes (Signed)
LDAs removed in preparation for discharge. An After Visit Summary was printed and given to the patient. Instructions reviewed with patient and family at bedside and all questions answered.  Lanney Gins, RN   Allergies as of 01/13/2022   No Known Allergies      Medication List     STOP taking these medications    amoxicillin-clavulanate 875-125 MG tablet Commonly known as: AUGMENTIN   fluticasone 50 MCG/ACT nasal spray Commonly known as: FLONASE       TAKE these medications    acetaminophen 500 MG tablet Commonly known as: TYLENOL Take 500 mg by mouth every 6 (six) hours as needed.   aspirin 81 MG chewable tablet Chew 1 tablet (81 mg total) by mouth daily. Start taking on: January 14, 2022   atorvastatin 80 MG tablet Commonly known as: LIPITOR Take 1 tablet (80 mg total) by mouth daily. Start taking on: January 14, 2022   lisinopril 10 MG tablet Commonly known as: ZESTRIL Take 5 mg by mouth daily.   ticagrelor 90 MG Tabs tablet Commonly known as: BRILINTA Take 1 tablet (90 mg total) by mouth 2 (two) times daily.           Discharge Instructions      Femoral Site Care This sheet gives you information about how to care for yourself after your procedure. Your health care provider may also give you more specific instructions. If you have problems or questions, contact your health care provider. What can I expect after the procedure? After the procedure, it is common to have: Bruising that usually fades within 1-2 weeks. Tenderness at the site. Follow these instructions at home: Wound care Follow instructions from your health care provider about how to take care of your insertion site. Make sure you: Wash your hands with soap and water before you change your bandage (dressing). If soap and water are not available, use hand sanitizer. Change your dressing as directed- pressure dressing removed 24 hours post-procedure (and switch for bandaid), bandaid  removed 72 hours post-procedure Do not take baths, swim, or use a hot tub for 7 days post-procedure. You may shower 48 hours after the procedure or as told by your health care provider. Gently wash the site with plain soap and water. Pat the area dry with a clean towel. Do not rub the site. This may cause bleeding. Check your site every day for signs of infection. Check for: Redness, swelling, or pain. Fluid or blood. Warmth. Pus or a bad smell. Activity Do not stoop, bend, or lift anything that is heavier than 10 lb (4.5 kg) for 2 weeks post-procedure. Do not drive self for 2 weeks post-procedure. Contact a health care provider if you have: A fever or chills. You have redness, swelling, or pain around your insertion site. Get help right away if: The catheter insertion area swells very fast. You pass out. You suddenly start to sweat or your skin gets clammy. The catheter insertion area is bleeding, and the bleeding does not stop when you hold steady pressure on the area. The area near or just beyond the catheter insertion site becomes pale, cool, tingly, or numb. These symptoms may represent a serious problem that is an emergency. Do not wait to see if the symptoms will go away. Get medical help right away. Call your local emergency services (911 in the [place] Do not drive yourself to the hospital.  This information is not intended to replace advice given to you by your health  care provider. Make sure you discuss any questions you have with your health care provider. Document Revised: 12/03/2017 Document Reviewed: 12/03/2017 Elsevier Patient Education  2020 [employer].

## 2022-01-15 ENCOUNTER — Encounter (HOSPITAL_COMMUNITY): Payer: Self-pay | Admitting: Internal Medicine

## 2022-01-15 ENCOUNTER — Inpatient Hospital Stay (HOSPITAL_COMMUNITY)
Admission: EM | Admit: 2022-01-15 | Discharge: 2022-01-18 | DRG: 057 | Disposition: A | Discharge status: Home Health | Payer: Medicare Other | Attending: Internal Medicine | Admitting: Internal Medicine

## 2022-01-15 ENCOUNTER — Emergency Department (HOSPITAL_COMMUNITY): Payer: Medicare Other

## 2022-01-15 ENCOUNTER — Other Ambulatory Visit: Payer: Self-pay

## 2022-01-15 DIAGNOSIS — R471 Dysarthria and anarthria: Secondary | ICD-10-CM | POA: Diagnosis not present

## 2022-01-15 DIAGNOSIS — I10 Essential (primary) hypertension: Secondary | ICD-10-CM | POA: Diagnosis present

## 2022-01-15 DIAGNOSIS — C9110 Chronic lymphocytic leukemia of B-cell type not having achieved remission: Secondary | ICD-10-CM | POA: Diagnosis present

## 2022-01-15 DIAGNOSIS — Z20822 Contact with and (suspected) exposure to covid-19: Secondary | ICD-10-CM | POA: Diagnosis present

## 2022-01-15 DIAGNOSIS — R4781 Slurred speech: Secondary | ICD-10-CM | POA: Diagnosis not present

## 2022-01-15 DIAGNOSIS — R29810 Facial weakness: Secondary | ICD-10-CM | POA: Diagnosis not present

## 2022-01-15 DIAGNOSIS — R29711 NIHSS score 11: Secondary | ICD-10-CM | POA: Diagnosis not present

## 2022-01-15 DIAGNOSIS — Z7982 Long term (current) use of aspirin: Secondary | ICD-10-CM

## 2022-01-15 DIAGNOSIS — I63231 Cerebral infarction due to unspecified occlusion or stenosis of right carotid arteries: Secondary | ICD-10-CM | POA: Diagnosis not present

## 2022-01-15 DIAGNOSIS — E785 Hyperlipidemia, unspecified: Secondary | ICD-10-CM | POA: Diagnosis not present

## 2022-01-15 DIAGNOSIS — Z79899 Other long term (current) drug therapy: Secondary | ICD-10-CM

## 2022-01-15 DIAGNOSIS — E876 Hypokalemia: Secondary | ICD-10-CM | POA: Diagnosis present

## 2022-01-15 DIAGNOSIS — Z743 Need for continuous supervision: Secondary | ICD-10-CM | POA: Diagnosis not present

## 2022-01-15 DIAGNOSIS — R29898 Other symptoms and signs involving the musculoskeletal system: Secondary | ICD-10-CM | POA: Diagnosis not present

## 2022-01-15 DIAGNOSIS — R531 Weakness: Secondary | ICD-10-CM | POA: Diagnosis not present

## 2022-01-15 DIAGNOSIS — I69354 Hemiplegia and hemiparesis following cerebral infarction affecting left non-dominant side: Secondary | ICD-10-CM | POA: Diagnosis not present

## 2022-01-15 DIAGNOSIS — I4510 Unspecified right bundle-branch block: Secondary | ICD-10-CM | POA: Diagnosis not present

## 2022-01-15 DIAGNOSIS — I63232 Cerebral infarction due to unspecified occlusion or stenosis of left carotid arteries: Secondary | ICD-10-CM | POA: Diagnosis not present

## 2022-01-15 DIAGNOSIS — I639 Cerebral infarction, unspecified: Secondary | ICD-10-CM | POA: Diagnosis not present

## 2022-01-15 DIAGNOSIS — R0902 Hypoxemia: Secondary | ICD-10-CM | POA: Diagnosis not present

## 2022-01-15 DIAGNOSIS — I6501 Occlusion and stenosis of right vertebral artery: Secondary | ICD-10-CM | POA: Diagnosis not present

## 2022-01-15 LAB — I-STAT CHEM 8, ED
BUN: 30 mg/dL — ABNORMAL HIGH (ref 8–23)
Calcium, Ion: 1.06 mmol/L — ABNORMAL LOW (ref 1.15–1.40)
Chloride: 108 mmol/L (ref 98–111)
Creatinine, Ser: 1.5 mg/dL — ABNORMAL HIGH (ref 0.61–1.24)
Glucose, Bld: 110 mg/dL — ABNORMAL HIGH (ref 70–99)
HCT: 33 % — ABNORMAL LOW (ref 39.0–52.0)
Hemoglobin: 11.2 g/dL — ABNORMAL LOW (ref 13.0–17.0)
Potassium: 3.4 mmol/L — ABNORMAL LOW (ref 3.5–5.1)
Sodium: 141 mmol/L (ref 135–145)
TCO2: 24 mmol/L (ref 22–32)

## 2022-01-15 LAB — CBC
HCT: 35.9 % — ABNORMAL LOW (ref 39.0–52.0)
Hemoglobin: 11.2 g/dL — ABNORMAL LOW (ref 13.0–17.0)
MCH: 30.9 pg (ref 26.0–34.0)
MCHC: 31.2 g/dL (ref 30.0–36.0)
MCV: 99.2 fL (ref 80.0–100.0)
Platelets: 164 10*3/uL (ref 150–400)
RBC: 3.62 MIL/uL — ABNORMAL LOW (ref 4.22–5.81)
RDW: 15.3 % (ref 11.5–15.5)
WBC: 25 10*3/uL — ABNORMAL HIGH (ref 4.0–10.5)
nRBC: 0 % (ref 0.0–0.2)

## 2022-01-15 LAB — COMPREHENSIVE METABOLIC PANEL
ALT: 24 U/L (ref 0–44)
AST: 25 U/L (ref 15–41)
Albumin: 3.5 g/dL (ref 3.5–5.0)
Alkaline Phosphatase: 61 U/L (ref 38–126)
Anion gap: 7 (ref 5–15)
BUN: 33 mg/dL — ABNORMAL HIGH (ref 8–23)
CO2: 25 mmol/L (ref 22–32)
Calcium: 8.4 mg/dL — ABNORMAL LOW (ref 8.9–10.3)
Chloride: 106 mmol/L (ref 98–111)
Creatinine, Ser: 1.46 mg/dL — ABNORMAL HIGH (ref 0.61–1.24)
GFR, Estimated: 50 mL/min — ABNORMAL LOW (ref >60)
Glucose, Bld: 113 mg/dL — ABNORMAL HIGH (ref 70–99)
Potassium: 3.3 mmol/L — ABNORMAL LOW (ref 3.5–5.1)
Sodium: 138 mmol/L (ref 135–145)
Total Bilirubin: 0.2 mg/dL — ABNORMAL LOW (ref 0.3–1.2)
Total Protein: 6.3 g/dL — ABNORMAL LOW (ref 6.5–8.1)

## 2022-01-15 LAB — RAPID URINE DRUG SCREEN, HOSP PERFORMED
Amphetamines: NOT DETECTED
Barbiturates: NOT DETECTED
Benzodiazepines: NOT DETECTED
Cocaine: NOT DETECTED
Opiates: NOT DETECTED
Tetrahydrocannabinol: NOT DETECTED

## 2022-01-15 LAB — DIFFERENTIAL
Basophils Absolute: 0 10*3/uL (ref 0.0–0.1)
Basophils Relative: 0 %
Eosinophils Absolute: 0.3 10*3/uL (ref 0.0–0.5)
Eosinophils Relative: 1 %
Lymphocytes Relative: 81 %
Lymphs Abs: 20.3 10*3/uL — ABNORMAL HIGH (ref 0.7–4.0)
Monocytes Absolute: 0 10*3/uL — ABNORMAL LOW (ref 0.1–1.0)
Monocytes Relative: 0 %
Neutro Abs: 4.5 10*3/uL (ref 1.7–7.7)
Neutrophils Relative %: 18 %

## 2022-01-15 LAB — URINALYSIS, ROUTINE W REFLEX MICROSCOPIC
Bilirubin Urine: NEGATIVE
Glucose, UA: NEGATIVE mg/dL
Hgb urine dipstick: NEGATIVE
Ketones, ur: NEGATIVE mg/dL
Leukocytes,Ua: NEGATIVE
Nitrite: NEGATIVE
Protein, ur: NEGATIVE mg/dL
Specific Gravity, Urine: >1.046 — ABNORMAL HIGH (ref 1.005–1.030)
pH: 7 (ref 5.0–8.0)

## 2022-01-15 LAB — RESP PANEL BY RT-PCR (FLU A&B, COVID) ARPGX2
Influenza A by PCR: NEGATIVE
Influenza B by PCR: NEGATIVE
SARS Coronavirus 2 by RT PCR: NEGATIVE

## 2022-01-15 LAB — PROTIME-INR
INR: 1 (ref 0.8–1.2)
Prothrombin Time: 13.2 seconds (ref 11.4–15.2)

## 2022-01-15 LAB — ETHANOL: Alcohol, Ethyl (B): <10 mg/dL (ref <10)

## 2022-01-15 LAB — APTT: aPTT: 24 seconds (ref 24–36)

## 2022-01-15 LAB — CBG MONITORING, ED: Glucose-Capillary: 104 mg/dL — ABNORMAL HIGH (ref 70–99)

## 2022-01-15 MED ORDER — ASPIRIN 81 MG PO CHEW
81.0000 mg | CHEWABLE_TABLET | Freq: Every day | ORAL | Status: DC
  Administered 2022-01-16 – 2022-01-18 (×3): 81 mg via ORAL
  Filled 2022-01-15 (×3): qty 1

## 2022-01-15 MED ORDER — SODIUM CHLORIDE 0.9 % IV SOLN
INTRAVENOUS | Status: DC
Start: 2022-01-15 — End: 2022-01-17

## 2022-01-15 MED ORDER — PSYLLIUM 95 % PO PACK
1.0000 | PACK | Freq: Every day | ORAL | Status: DC
  Administered 2022-01-16 – 2022-01-18 (×3): 1 via ORAL
  Filled 2022-01-15 (×5): qty 1

## 2022-01-15 MED ORDER — SODIUM CHLORIDE 0.9 % IV BOLUS
1000.0000 mL | Freq: Once | INTRAVENOUS | Status: AC
  Administered 2022-01-15: 1000 mL via INTRAVENOUS

## 2022-01-15 MED ORDER — TICAGRELOR 90 MG PO TABS
90.0000 mg | ORAL_TABLET | Freq: Two times a day (BID) | ORAL | Status: DC
  Administered 2022-01-15 – 2022-01-18 (×6): 90 mg via ORAL
  Filled 2022-01-15 (×6): qty 1

## 2022-01-15 MED ORDER — CHLORHEXIDINE GLUCONATE CLOTH 2 % EX PADS
6.0000 | MEDICATED_PAD | Freq: Every day | CUTANEOUS | Status: DC
  Administered 2022-01-16 – 2022-01-18 (×3): 6 via TOPICAL

## 2022-01-15 MED ORDER — IOHEXOL 350 MG/ML SOLN
100.0000 mL | Freq: Once | INTRAVENOUS | Status: AC | PRN
  Administered 2022-01-15: 100 mL via INTRAVENOUS

## 2022-01-15 MED ORDER — ACETAMINOPHEN 500 MG PO TABS
500.0000 mg | ORAL_TABLET | Freq: Four times a day (QID) | ORAL | Status: DC | PRN
  Administered 2022-01-15 – 2022-01-18 (×4): 500 mg via ORAL
  Filled 2022-01-15 (×5): qty 1

## 2022-01-15 MED ORDER — HEPARIN SODIUM (PORCINE) 5000 UNIT/ML IJ SOLN
5000.0000 [IU] | Freq: Three times a day (TID) | INTRAMUSCULAR | Status: DC
  Administered 2022-01-15 – 2022-01-18 (×9): 5000 [IU] via SUBCUTANEOUS
  Filled 2022-01-15 (×9): qty 1

## 2022-01-15 MED ORDER — ATORVASTATIN CALCIUM 40 MG PO TABS
80.0000 mg | ORAL_TABLET | Freq: Every day | ORAL | Status: DC
  Administered 2022-01-16 – 2022-01-18 (×3): 80 mg via ORAL
  Filled 2022-01-15 (×3): qty 2

## 2022-01-15 NOTE — H&P (Signed)
History and Physical    Patient: Daniel Osborne DXA:[id number] DOB: 1946/05/02 DOA: 01/15/2022 DOS: the patient was seen and examined on 01/15/2022 PCP: [facility]  Patient coming from: Home  Chief Complaint: left sided weakness Chief Complaint  Patient presents with   Extremity Weakness    S/p cva    HPI: Daniel Osborne is a 76 y.o. male with medical history significant of  leukemia, hypertension, CVA, hypokalemia, hypertension, who presents to the emergency department today for evaluation of stroke symptoms.  Last known well was 1030 last night when the patient went to bed.  He woke up around 1 AM and noticed that his left arm is weaker than normal.  He went back to bed and when he woke up at this morning he noted that his left arm was even weaker than before and his left leg is also weak.  He denies any visual changes.  Of note, he recently had a stroke last week and was admitted to the hospital from 2/6 - 2/10.  He underwent stenting of the left ICA at that time.  He was noted to have residual left facial droop, some dysarthria and some mild difficulty with dexterity of the left hand.  He notes his symptoms are much worse than when he was discharged from the hospital. CAVEAT - this information documented by Dr.Miller, reviewed with the patient and agree.    Review of Systems: As mentioned in the history of present illness. All other systems reviewed and are negative.   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 He has never used smokeless tobacco. He reports that he does not drink alcohol and does not use drugs.  Married 50 years. Has one son. Worked as a Conservation officer, nature. Lives with his wife, who has recently had MI. Son lives very nearby  No Known Allergies  Family History  Problem Relation Age of Onset   Stroke Mother    Cancer Father        leukemia   Cancer Sister        breast    Prior to Admission medications   Medication Sig Start Date End Date Taking? Authorizing Provider  acetaminophen (TYLENOL) 500 MG tablet Take 500 mg by mouth every 6 (six) hours as needed.   Yes [provider]  aspirin 81 MG chewable tablet Chew 1 tablet (81 mg total) by mouth daily. 01/14/22  Yes Jacqualine Mau, NP  atorvastatin (LIPITOR) 80 MG tablet Take 1 tablet (80 mg total) by mouth daily. 01/14/22  Yes Shelly Coss, MD  Calcium Carbonate Antacid (ALKA-SELTZER ANTACID PO) Take 2 tablets by mouth daily as needed. Mix with water   Yes [provider]  lisinopril (ZESTRIL) 10 MG tablet Take 5 mg by mouth daily.   Yes  [provider]  Multiple Vitamin (MULTIVITAMIN) tablet Take 1 tablet by mouth daily.   Yes [provider]  psyllium (METAMUCIL) 58.6 % packet Take 1 packet by mouth daily.   Yes [provider]  ticagrelor (BRILINTA) 90 MG TABS tablet Take 1 tablet (90 mg total) by mouth 2 (two) times daily. 01/13/22  Yes Jacqualine Mau, NP  Vitamin D3 (VITAMIN D) 25 MCG tablet Take 2,000 Units by mouth daily.   Yes [provider]    Physical Exam: Vitals:   01/15/22 1730 01/15/22 1800 01/15/22 1830 01/15/22 1900  BP: 122/67 120/63 115/66 139/64  Pulse: 81 74 77 76  Resp: (!) 25 18 (!) 24 (!) 26  SpO2: 94% 97% 97% 98%   General: WNWD man in no distress, in  good humor HEENT - [place]/At, nl edention Neck - supply, no thyromegaly Pul - nl respirations, Lungs CTAP Cor- 2+ radial and DP pulses, RRR Abd- BS+, soft, w/o HSM, no tenderness Ext - no deformities Neuro: - CN II-XII - mild left facial droop, nl movement of brow, PERRLA, EOMI, no deviation of tongue no fasciculations, absent shoulder shrug left. MS - right grip, UE, LE strength nl; 0 grip and UE movement left, minimal movement LE. DTR- 2+ right biceps and radial tendons, 1+ knee; left DTRs negative.   Data Reviewed:  K 3.4, Glucose 110, BUN 30 (baseline 19), Cr 1.5 (baseline 1.28), T. Protein 6.3, Hgb 11.2, WBC 22k 18/81, INR 1.0, Lipid panel 01/10/22 w/ LDL 158, A1C 6.0%. ECHO 01/10/22 with nl EF, grade I diastolic dysfxn.  CT head with extension of corical right posterior frontal CVA. CTA no LVO, unchanged stenosis (tight) R-ICA, patent L-ICA stent, unchanged severe R-VBA stenosis.  EKG Sinus Rhythm, RBBB, LAFB, RVH, no acute changes.  Assessment and Plan: No notes have been filed under this hospital service. Service: Hospitalist Neuro - patient with recent stroke 01/10/22 with full work up including lipids, A1C, ECHO. Now with new left sided weakness, fairly profound. CT and CTA as above. Dr. Leonie Man performed tele-neuro eval: he opines that patient has suffered ischemic injury w/w hypotension and poor perfusion via R-ICA. He has recommended course of tx as below: Plan Step-down admit - tele monitoring and q4 hour neuro checks  Permissive hypertension with goal of SBP 130-150, IVF with NS @ 75 and for persistent hypotension add pressor agent  Neurology will follow.  Question of delayed endarterectomy when patient more stable  Stroke protocols  2. HLD - continue statin therapy  3. CLL - stable  4. HTN- per #1 will be permissive re control - hold ACE-I     Advance Care Planning:   Code Status: Full Code    Consults: Neurology - Dr. Leonie Man involved   Family Communication: son present  during exam. Understands tx plan  Severity of Illness: The appropriate patient status for this patient is INPATIENT. Inpatient status is judged to be reasonable and necessary in order to provide the required intensity of service to ensure the patient's safety. The patient's presenting symptoms, physical exam findings, and initial radiographic and laboratory data in the context of their chronic comorbidities is felt to place them at high risk for further clinical deterioration. Furthermore, it is not anticipated that the patient will be medically stable for discharge from the hospital within 2 midnights of admission.   * I certify that at the point of admission it is my clinical judgment that the patient will require inpatient hospital care spanning beyond 2  midnights from the point of admission due to high intensity of service, high risk for further deterioration and high frequency of surveillance required.*  Author: Adella Hare, MD 01/15/2022 7:43 PM  For on call review [email or web address].

## 2022-01-15 NOTE — ED Notes (Signed)
ED Provider at bedside. 

## 2022-01-15 NOTE — ED Triage Notes (Signed)
[facility] admitted [facility], carotid stent place, weakness left arm and leg .  Last normal 10pm last night,  VSS. CBG 141, ekg rbbb. 18g rt AC placed by EMS  Denies chest pain/sob. Mild left facial droop since stroke.  A&O x4, answering questions appropriately and follows commands.  PA at bedside on arrival

## 2022-01-15 NOTE — ED Provider Notes (Signed)
[facility] EMERGENCY DEPARTMENT Provider Note   CSN: [id number] Arrival date & time: 01/15/22  1557  An emergency department physician performed an initial assessment on this suspected stroke patient at 1620.  History  Chief Complaint  Patient presents with   Extremity Weakness    S/p cva    Daniel Osborne is a 76 y.o. male.  HPI  76 year old male with a history of leukemia, hypertension, CVA, hypokalemia, hypertension, who presents to the emergency department today for evaluation of stroke symptoms.  Last known well was 1030 last night when the patient went to bed.  He woke up around 1 AM and noticed that his left arm is weaker than normal.  He went back to bed and when he woke up at this morning he noted that his left arm was even weaker than before and his left leg is also weak.  He denies any visual changes.  Of note, he recently had a stroke last week and was admitted to the hospital from 2/6 - 2/10.  He underwent stenting of the left ICA at that time.  He was noted to have residual left facial droop, some dysarthria and some mild difficulty with dexterity of the left hand.  He notes his symptoms are much worse than when he was discharged from the hospital.  He denies any recent illnesses or infectious symptoms.  He denies any chest pain or shortness of breath.  Home Medications Prior to Admission medications   Medication Sig Start Date End Date Taking? Authorizing Provider  acetaminophen (TYLENOL) 500 MG tablet Take 500 mg by mouth every 6 (six) hours as needed.    [provider]  aspirin 81 MG chewable tablet Chew 1 tablet (81 mg total) by mouth daily. 01/14/22   Jacqualine Mau, NP  atorvastatin (LIPITOR) 80 MG tablet Take 1 tablet (80 mg total) by mouth daily. 01/14/22   Shelly Coss, MD  lisinopril (ZESTRIL) 10 MG tablet Take 5 mg by mouth daily.    [provider]  ticagrelor (BRILINTA) 90 MG TABS tablet Take 1 tablet (90 mg total) by mouth 2 (two)  times daily. 01/13/22   Jacqualine Mau, NP      Allergies    Patient has no known allergies.    Review of Systems   Review of Systems See HPI for pertinent positives or negatives.   Physical Exam Updated Vital Signs BP 122/67    Pulse 81    Resp (!) 25    SpO2 94%  Physical Exam Vitals and nursing note reviewed.  Constitutional:      General: He is not in acute distress.    Appearance: He is well-developed.  HENT:     Head: Normocephalic and atraumatic.  Eyes:     Conjunctiva/sclera: Conjunctivae normal.  Cardiovascular:     Rate and Rhythm: Normal rate and regular rhythm.     Heart sounds: No murmur heard. Pulmonary:     Effort: Pulmonary effort is normal. No respiratory distress.     Breath sounds: Normal breath sounds.  Abdominal:     Palpations: Abdomen is soft.     Tenderness: There is no abdominal tenderness.  Musculoskeletal:        General: No swelling.     Cervical back: Neck supple.  Skin:    General: Skin is warm and dry.     Capillary Refill: Capillary refill takes less than 2 seconds.  Neurological:     Mental Status: He is alert.  Comments: Mental Status:  Alert, thought content appropriate, able to give a coherent history. Speech fluent without evidence of aphasia. Able to follow 2 step commands without difficulty.  Cranial Nerves:  II:  Peripheral visual fields grossly normal, pupils equal, round, reactive to light III,IV, VI: ptosis not present, extra-ocular motions intact bilaterally  V,VII: left facial droop  VIII: hearing grossly normal to voice  X: uvula elevates symmetrically  XI: bilateral shoulder decreased on the left XII: midline tongue extension without fassiculations Motor:  Normal tone. Complete paralysis of the LUE. 1/5 strength to the LLE.  Sensory: decreased sensation to the LUE/LLE Neglect of the LUE/LLE    Psychiatric:        Mood and Affect: Mood normal.    ED Results / Procedures / Treatments   Labs (all labs  ordered are listed, but only abnormal results are displayed) Labs Reviewed  CBC - Abnormal; Notable for the following components:      Result Value   WBC 25.0 (*)    RBC 3.62 (*)    Hemoglobin 11.2 (*)    HCT 35.9 (*)    All other components within normal limits  DIFFERENTIAL - Abnormal; Notable for the following components:   Lymphs Abs 20.3 (*)    Monocytes Absolute 0.0 (*)    All other components within normal limits  COMPREHENSIVE METABOLIC PANEL - Abnormal; Notable for the following components:   Potassium 3.3 (*)    Glucose, Bld 113 (*)    BUN 33 (*)    Creatinine, Ser 1.46 (*)    Calcium 8.4 (*)    Total Protein 6.3 (*)    Total Bilirubin 0.2 (*)    GFR, Estimated 50 (*)    All other components within normal limits  I-STAT CHEM 8, ED - Abnormal; Notable for the following components:   Potassium 3.4 (*)    BUN 30 (*)    Creatinine, Ser 1.50 (*)    Glucose, Bld 110 (*)    Calcium, Ion 1.06 (*)    Hemoglobin 11.2 (*)    HCT 33.0 (*)    All other components within normal limits  CBG MONITORING, ED - Abnormal; Notable for the following components:   Glucose-Capillary 104 (*)    All other components within normal limits  RESP PANEL BY RT-PCR (FLU A&B, COVID) ARPGX2  ETHANOL  PROTIME-INR  APTT  RAPID URINE DRUG SCREEN, HOSP PERFORMED  URINALYSIS, ROUTINE W REFLEX MICROSCOPIC    EKG EKG Interpretation  Date/Time:  Sunday January 15 2022 16:19:33 EST Ventricular Rate:  72 PR Interval:  190 QRS Duration: 110 QT Interval:  400 QTC Calculation: 438 R Axis:   -24 Text Interpretation: Sinus rhythm Incomplete RBBB and LAFB Right ventricular hypertrophy since last tracing no significant change Confirmed by Noemi Chapel [phone]) on 01/15/2022 4:23:35 PM  Radiology CT HEAD CODE STROKE WO CONTRAST  Result Date: 01/15/2022 CLINICAL DATA:  Code stroke. Neuro deficit, acute, stroke suspected. Left-sided weakness and facial droop. History of previous stroke. EXAM: CT HEAD  WITHOUT CONTRAST TECHNIQUE: Contiguous axial images were obtained from the base of the skull through the vertex without intravenous contrast. RADIATION DOSE REDUCTION: This exam was performed according to the departmental dose-optimization program which includes automated exposure control, adjustment of the mA and/or kV according to patient size and/or use of iterative reconstruction technique. COMPARISON:  MRI 01/10/2022 and CT 01/09/2022 FINDINGS: Brain: No abnormality seen affecting the brainstem or cerebellum. Left cerebral hemisphere shows chronic small-vessel ischemic changes of  the white matter. On the right, low-density affecting the cortex in the right posterior frontal region is seen as expected in the location of the previous infarction shown by MRI. However, there appears to be extension with involvement of adjacent cortical and subcortical brain towards the vertex. No evidence of mass effect or hemorrhage. Chronic small-vessel ischemic changes present on the right as well. Vascular: There is atherosclerotic calcification of the major vessels at the base of the brain. Skull: Negative Sinuses/Orbits: No active sinus inflammatory disease. Orbits negative. Other: None ASPECTS ([place] Stroke Program Early CT Score) - Ganglionic level infarction (caudate, lentiform nuclei, internal capsule, insula, M1-M3 cortex): 7 - Supraganglionic infarction (M4-M6 cortex): 2 Total score (0-10 with 10 being normal): 9 IMPRESSION: 1. Low-density evident at the site of previously seen cortical infarction in the right posterior frontal lobe. Extension of infarction since the initial evaluation, with a new area of cortical involvement towards the right frontal vertex. No evidence of mass effect or hemorrhage. 2. ASPECTS is 9 3. These results were called by telephone at the time of interpretation on 01/15/2022 at 4:42 pm to provider Dr. Sabra Heck, who verbally acknowledged these results. Electronically Signed   By: Nelson Chimes  M.D.   On: 01/15/2022 16:45   CT ANGIO HEAD NECK W WO CM W PERF (CODE STROKE)  Result Date: 01/15/2022 CLINICAL DATA:  Neuro deficit, acute, stroke suspected. Left-sided weakness and facial droop. History of a recent stroke and left carotid bifurcation stenting. EXAM: CT ANGIOGRAPHY HEAD AND NECK CT PERFUSION BRAIN TECHNIQUE: Multidetector CT imaging of the head and neck was performed using the standard protocol during bolus administration of intravenous contrast. Multiplanar CT image reconstructions and MIPs were obtained to evaluate the vascular anatomy. Carotid stenosis measurements (when applicable) are obtained utilizing NASCET criteria, using the distal internal carotid diameter as the denominator. Multiphase CT imaging of the brain was performed following IV bolus contrast injection. Subsequent parametric perfusion maps were calculated using RAPID software. RADIATION DOSE REDUCTION: This exam was performed according to the departmental dose-optimization program which includes automated exposure control, adjustment of the mA and/or kV according to patient size and/or use of iterative reconstruction technique. CONTRAST:  148mL OMNIPAQUE IOHEXOL 350 MG/ML SOLN COMPARISON:  Head and neck CTA 01/09/2022 FINDINGS: CTA NECK FINDINGS Aortic arch: Standard 3 vessel aortic arch with mild atherosclerotic plaque. No significant arch vessel origin stenosis. Right carotid system: Patent common carotid artery. 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 Left carotid system: Interval stenting of the left carotid bifurcation with the stent being patent and without a significant common carotid stenosis more proximally or ICA stenosis more distally. Vertebral arteries: Patent and codominant with unchanged severe right and at most mild left vertebral artery origin stenoses. Skeleton: No acute osseous abnormality or suspicious osseous lesion. Other neck: Slightly decreased size mildly  enlarged cervical lymph nodes since the recent prior CT, for example a left level II lymph node now measures 1.2 cm in short axis (previously 1.5 cm). Upper chest: No apical lung consolidation or mass. Review of the MIP images confirms the above findings CTA HEAD FINDINGS Anterior circulation: There is reconstitution of the right ICA beginning in the proximal petrous segment. Moderate to severe bilateral paraclinoid ICA stenoses are similar to the prior CTA. ACAs and MCAs are patent with unchanged moderate bilateral M1 and M2 stenoses. The right A1 segment remains diminutive, likely congenitally hypoplastic. No aneurysm is identified. Posterior circulation: The intracranial vertebral arteries are patent to  the basilar with unchanged severe right greater than left distal V4 stenoses. Patent PICA and SCA origins are seen bilaterally. The basilar artery is patent with atherosclerotic irregularity but no significant stenosis. Posterior communicating arteries are diminutive or absent. Both PCAs are patent with unchanged moderate right P1 and moderate to severe left P1 and P2 stenoses. No aneurysm is identified. Venous sinuses: As permitted by contrast timing, patent. Anatomic variants: None. Review of the MIP images confirms the above findings CT Brain Perfusion Findings: ASPECTS: 9 CBF (<30%) Volume: 6 mL Perfusion (Tmax>6.0s) volume: 232 mL Mismatch Volume: 226 mL Infarction Location: High right frontal lobe IMPRESSION: CTA HEAD AND NECK: 1. No acute large vessel occlusion. 2. 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 3. Patent left carotid bifurcation stent. 4. Unchanged advanced intracranial atherosclerosis as above. 5. 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 6. Mildly decreased cervical lymphadenopathy. 7. Aortic Atherosclerosis (ICD10-I70.0). CT BRAIN PERFUSION: Small core infarct in the high right frontal lobe with delayed perfusion throughout the right MCA  territory as above. Electronically Signed   By: Logan Bores M.D.   On: 01/15/2022 17:42    Procedures .Critical Care Performed by: Rodney Booze, PA-C Authorized by: Rodney Booze, PA-C   Critical care provider statement:    Critical care time (minutes):  34   Critical care time was exclusive of:  Separately billable procedures and treating other patients   Critical care was necessary to treat or prevent imminent or life-threatening deterioration of the following conditions:  CNS failure or compromise   Critical care was time spent personally by me on the following activities:  Development of treatment plan with patient or surrogate, discussions with consultants, evaluation of patient's response to treatment, examination of patient, ordering and review of laboratory studies, ordering and review of radiographic studies, ordering and performing treatments and interventions, pulse oximetry, re-evaluation of patient's condition and review of old charts   I assumed direction of critical care for this patient from another provider in my specialty: no     Care discussed with: admitting provider      Medications Ordered in ED Medications  iohexol (OMNIPAQUE) 350 MG/ML injection 100 mL (100 mLs Intravenous Contrast Given 01/15/22 1656)  sodium chloride 0.9 % bolus 1,000 mL (1,000 mLs Intravenous New Bag/Given 01/15/22 1717)    ED Course/ Medical Decision Making/ A&P                           Medical Decision Making Amount and/or Complexity of Data Reviewed Labs: ordered. Radiology: ordered.   This patient presents to the ED for concern of CVA, this involves an extensive number of treatment options, and is a complaint that carries with it a high risk of complications and morbidity.  The differential diagnosis includes but is not limited to [place], CVA, recrudescence of old CVA sxs, meningitis, encephalitis   Comorbidities that complicate the patient evaluation: Patients presentation is  complicated by their history of recent CVA and ICA stenting  Additional history obtained: Records reviewed previous admission documents  Lab Tests: I Ordered, and personally interpreted labs.  The pertinent results include:   Chem 8 - with mild hypokalemia, elevated bun/cr which appears baseline, mild anemia CBC - with leukocytosis improved from prior, likely related to underlying leukemia, mild anemia which appears stable CMP with mild hypokalemia, mildly elevated bun/cr as above ETOH neg UA pending on admission UDS pending on admission COVID/flu on  admission  Imaging Studies ordered: I ordered, independently visualized, and interpreted imaging which showed   CT scan head w/o constrast -   1. Low-density evident at the site of previously seen cortical infarction in the right posterior frontal lobe. Extension of infarction since the initial evaluation, with a new area of cortical involvement towards the right frontal vertex. No evidence of mass effect or hemorrhage. 2. ASPECTS is 9 CTA head/neck -  1. No acute large vessel occlusion. 2. 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 3. Patent left carotid bifurcation stent. 4. Unchanged advanced intracranial atherosclerosis as above. 5. 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 6. Mildly decreased cervical lymphadenopathy. 7. Aortic Atherosclerosis (ICD10-I70.0). CT BRAIN PERFUSION: Small core infarct in the high right frontal lobe with delayed perfusion throughout the right MCA territory as above.  I agree with the radiologist interpretation  Cardiac Monitoring: The patient was maintained on a cardiac monitor.  I personally viewed and interpreted the cardiac monitor which showed an underlying rhythm of:  sinus rhythm  Critical Interventions: code stroke initiated  Consultations Obtained: Dr. Sabra Heck, attending physician, discussed case with the neurology consultant Dr. Leonie Man , and discussed   findings as well as pertinent plan - they recommend adding CTA head/neck  5:16 PM Dr. Leonie Man reviewed CTAs. Recommends keeping the pts BP above 080-223 systolic. Recommends admission to medicine at either [facility] and [facility]. Does not recommends TPA and does not feel patient is a candidate for thrombectomy  5:43 PM CONSULT with Dr. Linda Hedges who accepts patient for admission   Complexity of problems addressed: Patients presentation is most consistent with  acute presentation with potential threat to life or bodily function  Disposition: After consideration of the diagnostic results and the patients response to treatment,  I feel that the patent would benefit from admission to hospitalist for acute stroke. .     Final Clinical Impression(s) / ED Diagnoses Final diagnoses:  Cerebrovascular accident (CVA), unspecified mechanism [facility])    Rx / DC Orders ED Discharge Orders     None         Bishop Dublin 01/15/22 1813    Noemi Chapel, MD 01/15/22 1900

## 2022-01-15 NOTE — Consult Note (Signed)
[facility] Telemedicine Consult   Requesting Provider: Noemi Chapel Consult Participants: patient, bedside RN and Dr Sabra Heck [facility] of the [facility] Location of the patient: [facility] ED  This consult was provided via telemedicine with 2-way video and audio communication. The patient/family was informed that care would be provided in this way and agreed to receive care in this manner.    Chief Complaint: left hemiplegia  HPI: Daniel Osborne is a 76 y.o. 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 Norma Fredrickson during that hospitalization.  Patient was discharged home with only mild left facial droop and mild left hand weakness.  He was doing fine until last night when he went to sleep at 10:00.  This morning when he woke up he noticed left-sided weakness and at the day went on weakness but was.  Patient denies any ongoing illness in the form of fever, cough, bladder infection, dehydration.  He has been compliant with taking his aspirin and Brilinta not missed any doses.  On arrival in the ER his slightly hypertensive with blood pressure 505 systolic.    LKW: 2200 01/14/22 tpa given?: No, recent stroke and outside window IR Thrombectomy? No, chronic right ICA occlusion Modified Rankin Scale: 2-Slight disability-UNABLE to perform all activities but does not need assistance 2 Time of teleneurologist evaluation: 1640  Exam: Vitals:   01/15/22 1611 01/15/22 1636  BP: 129/61 (!) 113/53  Pulse: 78 69  Resp: 20 18  SpO2: 99% 100%    General: Pleasant elderly Caucasian male not in distress. . Afebrile. Head is  nontraumatic. Neck is supple without bruit.    Cardiac exam no murmur or gallop. Lungs are clear to auscultation. Distal pulses are well felt.  Neurological Exam :  Awake alert oriented to time place and person.  No aphasia.  Mild dysarthria.  Follows commands well.  Slight right gaze preference and slight restriction of left lateral gaze.  Blinks to threat more on the right than the left.  Pupils and fundi cannot be reliably assessed.  Mild left lower facial weakness when he smiles.  Tongue midline.  Motor system exam shows dense left hemiplegia with left upper extremity 0 medial/5 strength.  Sensation appears diminished likely on the left.  Coordination normal on the right and protests.  On the left.  Gait not tested.   1A: Level of Consciousness - 0 1B: Ask Month and Age - 0 1C: 'Blink Eyes' & 'Squeeze Hands' - 0 2: Test Horizontal Extraocular Movements - 1 3: Test Visual Fields - 1 4: Test Facial Palsy - 1 5A: Test Left Arm Motor Drift - 4 5B: Test Right Arm Motor Drift - 0 6A: Test Left Leg Motor Drift - 2 6B: Test Right Leg Motor Drift - 0 7: Test Limb Ataxia - 0 8: Test Sensation - 1 9: Test Language/Aphasia- 0 10: Test Dysarthria - 1 11: Test Extinction/Inattention - 0 NIHSS score: 11    Imaging Reviewed: ct head wo, CTA/P My preliminary read for CT angiogram shows patent left ICA stent.  Chronic right ICA i occlusion.  CT perfusion shows core infarct of 6 mL with mismatch volume of 238ml Labs reviewed in epic  and pertinent values follow: I stat, CMP and CBC   Assessment: 76 year old Caucasian male with sudden onset of left hemiparesis this morning which is gradually gotten worse likely due to right hemispheric ischemia possibly due to relative hypotension in a patient with known chronic right carotid occlusion and failure of collaterals patient has known significant multivessel obstructive cranial disease with known chronic right carotid occlusion and recent symptomatic left  carotid stenosis followed by elective left carotid stenting last week.  On contrast CT scan of the head was reviewed and shows likely extension of the previous right posterior frontal infarct.  Aspects score of 9 Patient is not a candidate for thrombolysis due to recent stroke and presenting outside the time window.  The new right brain stroke is not in the territory of the recent carotid stent. Patient not a candidate for thrombectomy due to chronic occlusion. Recommendations: Check stat CT angiogram of the brain with CT perfusion though I doubt patient is a candidate for revascularization of the right carotid since it was previously chronically occluded and he had recent left carotid recent stent last week. IV normal saline 500 cc bolus followed by 75 cc/hr and patient needs permissive hypertension with augmentation of blood pressure to maintain systolic BP 762-263 range.  May need to add low-dose phenylephrine. Continue aspirin and Brilinta in the current dosages. Admit to the medical hospitalist team.  This can be done at [facility] but in case patient is transferred to [facility] kindly notify neuro hospitalist team on call.  If patient stays at [facility] telemetry neurology can provide follow-up care.  The patient is transferred to [facility] stroke team will follow in person Long discussion with patient and answered questions. Discussed with Dr. Noemi Chapel, ER physician and Dr. Ladean Raya neuro interventional radiology on-call answered questions.  Family not available at the present time. Further recommendation as per follow-up telemetry neurology/stroke team This patient is receiving care for possible acute neurological changes. There was 60 minutes of care by this provider at the time of service, including time for direct evaluation via telemedicine, review of medical records, imaging studies and discussion of findings with providers, the patient and/or family.  I spent 60  minutes of neuro critical care with time in the evaluation, assessment and treatment of this patient.  Antony Contras MD [facility] [phone]  If 7pm- 7am, please page neurology on call as listed in [place].

## 2022-01-16 DIAGNOSIS — C9110 Chronic lymphocytic leukemia of B-cell type not having achieved remission: Secondary | ICD-10-CM | POA: Diagnosis present

## 2022-01-16 LAB — CBC
HCT: 37.2 % — ABNORMAL LOW (ref 39.0–52.0)
Hemoglobin: 11.4 g/dL — ABNORMAL LOW (ref 13.0–17.0)
MCH: 30 pg (ref 26.0–34.0)
MCHC: 30.6 g/dL (ref 30.0–36.0)
MCV: 97.9 fL (ref 80.0–100.0)
Platelets: 189 10*3/uL (ref 150–400)
RBC: 3.8 MIL/uL — ABNORMAL LOW (ref 4.22–5.81)
RDW: 15.3 % (ref 11.5–15.5)
WBC: 30.9 10*3/uL — ABNORMAL HIGH (ref 4.0–10.5)
nRBC: 0 % (ref 0.0–0.2)

## 2022-01-16 LAB — MRSA NEXT GEN BY PCR, NASAL: MRSA by PCR Next Gen: NOT DETECTED

## 2022-01-16 LAB — BASIC METABOLIC PANEL
Anion gap: 9 (ref 5–15)
BUN: 22 mg/dL (ref 8–23)
CO2: 23 mmol/L (ref 22–32)
Calcium: 8.5 mg/dL — ABNORMAL LOW (ref 8.9–10.3)
Chloride: 109 mmol/L (ref 98–111)
Creatinine, Ser: 1.09 mg/dL (ref 0.61–1.24)
GFR, Estimated: >60 mL/min (ref >60)
Glucose, Bld: 115 mg/dL — ABNORMAL HIGH (ref 70–99)
Potassium: 3.4 mmol/L — ABNORMAL LOW (ref 3.5–5.1)
Sodium: 141 mmol/L (ref 135–145)

## 2022-01-16 MED ORDER — PHENYLEPHRINE HCL-NACL 20-0.9 MG/250ML-% IV SOLN
25.0000 ug/min | INTRAVENOUS | Status: DC
  Administered 2022-01-16: 25 ug/min via INTRAVENOUS
  Filled 2022-01-16 (×2): qty 250

## 2022-01-16 MED ORDER — MIDODRINE HCL 5 MG PO TABS
10.0000 mg | ORAL_TABLET | Freq: Two times a day (BID) | ORAL | Status: DC
  Administered 2022-01-16: 10 mg via ORAL
  Filled 2022-01-16: qty 2

## 2022-01-16 MED ORDER — SODIUM CHLORIDE 0.9 % IV SOLN: 250.0000 mL | INTRAVENOUS | Status: DC

## 2022-01-16 MED ORDER — POTASSIUM CHLORIDE CRYS ER 20 MEQ PO TBCR
40.0000 meq | EXTENDED_RELEASE_TABLET | Freq: Once | ORAL | Status: AC
  Administered 2022-01-16: 40 meq via ORAL
  Filled 2022-01-16: qty 2

## 2022-01-16 NOTE — TOC Progression Note (Signed)
°  Transition of Care [facility]) Screening Note   Patient Details  Name: Daniel Osborne Date of Birth: [birth year]   Transition of Care [facility]) CM/SW Contact:    Shade Flood, LCSW Phone Number: 01/16/2022, 11:46 AM    Pt admitted with new CVA. Pt was recently discharged from [facility] with Outpatient therapy follow up. PT/OT now recommending CIR. TOC will await CIR evaluation. Will be available if pt ends up needing SNF rehab instead of CIR.  Transition of Care Department [facility]) has reviewed patient and no TOC needs have been identified at this time. We will continue to monitor patient advancement through interdisciplinary progression rounds. If new patient transition needs arise, please place a TOC consult.

## 2022-01-16 NOTE — Anesthesia Postprocedure Evaluation (Signed)
Anesthesia Post Note  Patient: BILAAL LEIB  Procedure(s) Performed: IR WITH ANESTHESIA     Patient location during evaluation: PACU Anesthesia Type: MAC Level of consciousness: awake and patient cooperative Pain management: pain level controlled Vital Signs Assessment: post-procedure vital signs reviewed and stable Respiratory status: spontaneous breathing, nonlabored ventilation, respiratory function stable and patient connected to nasal cannula oxygen Cardiovascular status: stable and blood pressure returned to baseline Postop Assessment: no apparent nausea or vomiting Anesthetic complications: no   No notable events documented.  Last Vitals:  Vitals:   01/13/22 1000 01/13/22 1100  BP: 109/61 (!) 116/56  Pulse: 68 68  Resp: 19 18  Temp:    SpO2: 99% 96%    Last Pain:  Vitals:   01/13/22 0800  TempSrc: Oral  PainSc: 0-No pain                 Chae Oommen

## 2022-01-16 NOTE — Plan of Care (Addendum)
°  Problem: Acute Rehab PT Goals(only PT should resolve) Goal: Pt Will Go Supine/Side To Sit Outcome: Progressing Flowsheets (Taken 01/16/2022 1346) Pt will go Supine/Side to Sit: with minimal assist Goal: Patient Will Transfer Sit To/From Stand Outcome: Progressing Flowsheets (Taken 01/16/2022 1346) Patient will transfer sit to/from stand: with minimal assist Goal: Pt Will Transfer Bed To Chair/Chair To Bed Outcome: Progressing Flowsheets (Taken 01/16/2022 1346) Pt will Transfer Bed to Chair/Chair to Bed: with min assist Goal: Pt Will Ambulate Outcome: Progressing Flowsheets (Taken 01/16/2022 1346) Pt will Ambulate:  100 feet  with minimal assist  with rolling walker    1:49 PM, 01/16/22 [place] [facility] [place] [phone] Ph:[phone]

## 2022-01-16 NOTE — Progress Notes (Signed)
Subjective: Patient reports that his left leg has improved some, but his left arm not very much.  He has not noticed much fluctuation with BP changes, and neither has his nurse.  Exam: Vitals:   01/16/22 1130 01/16/22 1145  BP: 117/61 (!) 136/49  Pulse: (!) 56 64  Resp: 13 14  Temp:    SpO2: 99% 100%   Gen: In bed, NAD Resp: non-labored breathing, no acute distress Abd: soft, nt  Neuro: MS: Awake, alert, oriented and appropriate CN: Question mild left upper visual field cut, difficult future, mild left facial weakness Motor: He has severe left upper extremity plegia, he is able to lift left leg somewhat against gravity proximally, but appears to have more weakness distally  Imaging reviewed: CTA head and neck-chronic right ICA occlusion with acute infarct and diffuse hypoperfusion of the right hemisphere  Pertinent Labs: Creatinine 1.09, improved from 1.5 on admission  Impression: 76 year old male with progression of right hemispheric infarcts in the setting of failed collaterals with chronic right ICA occlusion.  Though he was not particularly hypertensive on arrival, given his elevated creatinine, I wonder if he was somewhat volume depleted and dropped his pressures at home.  Treatment at this time is blood pressure support, and I would favor another day of strict maintenance in the 130-150 range.  I wonder if a drug such as midodrine could be helpful to avoid hypotension moving forward.  Recommendations: 1) consider starting midodrine 2) continue Neo-Synephrine to maintain blood pressures in the 130-150 range 3) continue aspirin and Brilinta 4) neurology will continue to follow  Roland Rack, MD [facility] [phone]  If 7pm- 7am, please page neurology on call as listed in [place].

## 2022-01-16 NOTE — Evaluation (Signed)
Occupational Therapy Evaluation Patient Details Name: Daniel Osborne MRN: [id number] DOB: 1946-03-13 Today's Date: 01/16/2022   History of Present Illness Daniel Osborne is a 76 y.o. male with medical history significant of  leukemia, hypertension, CVA, hypokalemia, hypertension, who presents to the emergency department today for evaluation of stroke symptoms.  Last known well was 1030 last night when the patient went to bed.  He woke up around 1 AM and noticed that his left arm is weaker than normal.  He went back to bed and when he woke up at this morning he noted that his left arm was even weaker than before and his left leg is also weak.  He denies any visual changes.  Of note, he recently had a stroke last week and was admitted to the hospital from 2/6 - 2/10.  He underwent stenting of the left ICA at that time.  He was noted to have residual left facial droop, some dysarthria and some mild difficulty with dexterity of the left hand.  He notes his symptoms are much worse than when he was discharged from the hospital.   Clinical Impression   Pt in bed upon therapy arrival and agreeable to participate in OT evaluation. Pt presents with left side hemiparesis causing severe difficulty utilizing his left UE to participate in ADL tasks. He is unable to actively use his left hand for any ADL tasks at this time. Mild-moderate left UE physical neglect noted requiring VC to maintain grip on walker handle. Education provided regarding positioning of left UE when seated in chair or in the bed. Recommend CIR at discharge to increase functional performance during ADL tasks and allow him to return home safely. OT will follow patient acutely.       Recommendations for follow up therapy are one component of a multi-disciplinary discharge planning process, led by the attending physician.  Recommendations may be updated based on patient status, additional functional criteria and insurance authorization.   Follow  Up Recommendations  Acute inpatient rehab (3hours/day)    Assistance Recommended at Discharge Frequent or constant Supervision/Assistance  Patient can return home with the following A lot of help with bathing/dressing/bathroom;A lot of help with walking and/or transfers;Assistance with cooking/housework;Assist for transportation;Help with stairs or ramp for entrance    Functional Status Assessment  Patient has had a recent decline in their functional status and demonstrates the ability to make significant improvements in function in a reasonable and predictable amount of time.  Equipment Recommendations  None recommended by OT    Recommendations for Other Services Rehab consult     Precautions / Restrictions Precautions Precautions: Fall Precaution Comments: Left side hemiparesis Restrictions Weight Bearing Restrictions: No      Mobility Bed Mobility Overal bed mobility: Needs Assistance Bed Mobility: Supine to Sit     Supine to sit: Mod assist          Transfers Overall transfer level: Needs assistance Equipment used: Rolling walker (2 wheels) Transfers: Sit to/from Stand, Bed to chair/wheelchair/BSC Sit to Stand: Mod assist     Step pivot transfers: Mod assist            Balance Overall balance assessment: Mild deficits observed, not formally tested         ADL either performed or assessed with clinical judgement   ADL Overall ADL's : Needs assistance/impaired     Grooming: Wash/dry hands;Wash/dry face;Oral care;Applying deodorant;Minimal assistance;Sitting   Upper Body Bathing: Minimal assistance;Sitting   Lower Body Bathing: Maximal assistance;Sit  to/from stand;Sitting/lateral leans   Upper Body Dressing : Sitting;Minimal assistance   Lower Body Dressing: Maximal assistance;Sit to/from stand;Sitting/lateral leans   Toilet Transfer: Minimal assistance;Ambulation;Rolling walker (2 wheels);Regular Toilet   Toileting- Clothing Manipulation and  Hygiene: Moderate assistance;Sit to/from stand;Sitting/lateral lean               Vision Baseline Vision/History: 0 No visual deficits;1 Wears glasses Ability to See in Adequate Light: 0 Adequate Patient Visual Report: No change from baseline Vision Assessment?: No apparent visual deficits     Perception Perception Perception: Impaired Inattention/Neglect: Does not attend to left side of body (neglect is mild-moderate during evaluation) Body Scheme: Slight impairment noted Spatial Orientation: Intact   Praxis Praxis Praxis: Intact    Pertinent Vitals/Pain Pain Assessment Pain Assessment: No/denies pain     Hand Dominance Right   Extremity/Trunk Assessment Upper Extremity Assessment Upper Extremity Assessment: LUE deficits/detail LUE Deficits / Details: Very limited muscle activation. Shoulder flexion 1/5, scapular elevation/retraction 0/5, shoulder extension 1/5, elbow flexion/extension: 0/5, wrist flexion/extension 0/5. Able to form a loose gross grasp. Unable to extend fingers of hand. P/ROM shoulder flexion to approximately 90 degrees before increased tone is felt. unable to complete passive er due to tone. Full passive elbow flexion/extension, wrist flexion/extension, composite finger extension achieved. LUE Sensation: WNL LUE Coordination: decreased fine motor;decreased gross motor   Lower Extremity Assessment Lower Extremity Assessment: Defer to PT evaluation       Communication Communication Communication: No difficulties   Cognition Arousal/Alertness: Awake/alert Behavior During Therapy: WFL for tasks assessed/performed Overall Cognitive Status: Within Functional Limits for tasks assessed                Exercises Other Exercises Other Exercises: Education on LUE positioning while seated in chair or in bed.   Shoulder Instructions      Home Living Family/patient expects to be discharged to:: Private residence Living Arrangements: Spouse/significant  other Available Help at Discharge: Available 24 hours/day Type of Home: Mobile home Home Access: Stairs to enter [employer] of Steps: 2 Entrance Stairs-Rails: Right;Left;Can reach both Home Layout: One level     Bathroom Shower/Tub: Teacher, early years/pre: Standard     Home Equipment: Public relations account executive (2 wheels);Cane - single point   Additional Comments: Pt reports that his mother-in-law does have additional equipment (DME) that he has access to if needed.  Lives With: Spouse    Prior Functioning/Environment Prior Level of Function : Independent/Modified Independent;Working/employed             Mobility Comments: No difficulty prior. ADLs Comments: Drives, works part time driving a truck        OT Problem List: Decreased strength;Decreased range of motion;Decreased coordination;Impaired balance (sitting and/or standing);Impaired UE functional use      OT Treatment/Interventions: Self-care/ADL training;Therapeutic exercise;Therapeutic activities;Patient/family education;Neuromuscular education;Splinting;DME and/or AE instruction;Balance training;Modalities;Manual therapy    OT Goals(Current goals can be found in the care plan section) Acute Rehab OT Goals Patient Stated Goal: to get stronger OT Goal Formulation: With patient Time For Goal Achievement: 01/30/22 Potential to Achieve Goals: Good  OT Frequency: Min 2X/week    Co-evaluation PT/OT/SLP Co-Evaluation/Treatment: Yes Reason for Co-Treatment: To address functional/ADL transfers   OT goals addressed during session: ADL's and self-care;Strengthening/ROM;Proper use of Adaptive equipment and DME      AM-PAC OT "6 Clicks" Daily Activity     Outcome Measure Help from another person eating meals?: A Little Help from another person taking care of personal grooming?: A Little  Help from another person toileting, which includes using toliet, bedpan, or urinal?: A Lot Help from another  person bathing (including washing, rinsing, drying)?: A Lot Help from another person to put on and taking off regular upper body clothing?: A Little Help from another person to put on and taking off regular lower body clothing?: Total 6 Click Score: 14   End of Session Equipment Utilized During Treatment: Gait belt;Rolling walker (2 wheels)  Activity Tolerance: Patient tolerated treatment well Patient left: in chair;with call bell/phone within reach  OT Visit Diagnosis: Hemiplegia and hemiparesis;Muscle weakness (generalized) (M62.81) Hemiplegia - Right/Left: Left Hemiplegia - dominant/non-dominant: Non-Dominant Hemiplegia - caused by: Cerebral infarction                Time: 2035-5974 OT Time Calculation (min): 29 min Charges:  OT General Charges $OT Visit: 1 Visit OT Evaluation $OT Eval Low Complexity: 1 Low OT Treatments $Self Care/Home Management : 8-22 mins  Ailene Ravel, OTR/L,CBIS  [phone]   Kaleigh Spiegelman, Clarene Duke 01/16/2022, 10:52 AM

## 2022-01-16 NOTE — Hospital Course (Addendum)
Daniel Osborne is a 76 yo male with past medical history significant for CLL, recent admission for CVA, hypertension who presented to the hospital secondary to stroke symptoms.  He was recently hospitalized at [facility] from 2/6 to 2/10 secondary to stroke.  At that time, he presented with left facial droop, left-sided weakness, slurred speech.  Work-up revealed right frontal infarct and left ICA stenosis.  He underwent cerebral angiogram with left ICA stent.  Patient was discharged home with home health.  He woke up around 1 AM on 2/12 and noticed that his left arm was weak.  When he woke up later that morning, he noticed that his left arm as well as left leg was much weaker than during his previous hospitalization.  In the emergency department, patient was evaluated by neurology.  He was admitted to ICU and started on Neo-Synephrine to keep his blood pressure >130.  2/13: Patient states that he is feeling well overall but continues to have left-sided weakness.  2/14: Patient states that the strength on his left side is improving.  He remains on low-dose of Neo-Synephrine this morning.

## 2022-01-16 NOTE — Progress Notes (Signed)
Code Stroke CT Times 1622 call  1623 beeper 1633 exam started 1634 exam finished 0100 images sent to [place] exam completed in [place] [facility] radiology called

## 2022-01-16 NOTE — Assessment & Plan Note (Addendum)
-  Recent hospitalization status post left ICA stent -Appreciate neurology service and recommendations. -Continue the use of midodrine, aspirin, Brilinta and statins. -Patient/family has declined SNF and CIR; patient will go home with home health services for PT, OT, RN and social worker. -Outpatient follow-up with PCP and neurology service. -Blood pressure goal is systolic blood pressure [phone].

## 2022-01-16 NOTE — Progress Notes (Signed)
°  Progress Note   Patient: Daniel Osborne SLH:[id number] DOB: 1946/12/03 DOA: 01/15/2022     1 DOS: the patient was seen and examined on 01/16/2022   Brief hospital course: Daniel Osborne is a 76 yo male with past medical history significant for CLL, recent admission for CVA, hypertension who presented to the hospital secondary to stroke symptoms.  He was recently hospitalized at [facility] from 2/6 to 2/10 secondary to stroke.  At that time, he presented with left facial droop, left-sided weakness, slurred speech.  Work-up revealed right frontal infarct and left ICA stenosis.  He underwent cerebral angiogram with left ICA stent.  Patient was discharged home with home health.  He woke up around 1 AM on 2/12 and noticed that his left arm was weak.  When he woke up later that morning, he noticed that his left arm as well as left leg was much weaker than during his previous hospitalization.  In the emergency department, patient was evaluated by neurology.  He was admitted to ICU and started on Neo-Synephrine to keep his blood pressure >130.  2/13: Patient states that he is feeling well overall but continues to have left-sided weakness.  Assessment and Plan: * CVA (cerebral vascular accident) ([place])- (present on admission) Recent hospitalization status post left ICA stent Appreciate neurology Continue Neo-Synephrine, added midodrine to keep blood pressure goal SBP >130 OT recommending CIR placement PT Pending Continue aspirin, Brilinta, statin  CLL (chronic lymphocytic leukemia) ([place]) Followed by oncology as outpatient Has chronic leukocytosis  Hypokalemia- (present on admission) Replace, trend         Physical Exam: Vitals:   01/16/22 1145 01/16/22 1200 01/16/22 1215 01/16/22 1230  BP: (!) 136/49 124/75 (!) 134/99 (!) 119/56  Pulse: 64 62 74 60  Resp: 14 13 19 17   Temp:      TempSrc:      SpO2: 100% 94% 100% 100%  Weight:      Height:       Examination: General  exam: Appears calm and comfortable  Respiratory system: Clear to auscultation. Respiratory effort normal. Cardiovascular system: S1 & S2 heard, RRR. No pedal edema. Gastrointestinal system: Abdomen is nondistended, soft and nontender. Normal bowel sounds heard. Central nervous system: Alert and oriented. Weakness of left upper arm 2/5, left lower extremity 1-2/5  Extremities: Symmetric in appearance bilaterally  Skin: No rashes, lesions or ulcers on exposed skin  Psychiatry: Judgement and insight appear stable. Mood & affect appropriate.    Data Reviewed:   K 3.4, WBC 30.9, UA negative, UDS negative, MRSA PCR negative   Family Communication: none at bedside   Disposition: Status is: Inpatient Remains inpatient appropriate because: stroke eval, on neo-synephrine, will need CIR placement           Planned Discharge Destination: Rehab      Author: Dessa Phi, DO 01/16/2022 12:51 PM  For on call review [email or web address].

## 2022-01-16 NOTE — Plan of Care (Signed)
°  Problem: Acute Rehab OT Goals (only OT should resolve) Goal: Pt. Will Perform Eating Flowsheets (Taken 01/16/2022 1055) Pt Will Perform Eating:  with modified independence  sitting Goal: Pt. Will Perform Grooming Flowsheets (Taken 01/16/2022 1055) Pt Will Perform Grooming:  with set-up  sitting Goal: Pt. Will Perform Upper Body Bathing 01/16/2022 1057 by Debby Bud, OT Flowsheets (Taken 01/16/2022 1057) Pt Will Perform Upper Body Bathing:  sitting  with min guard assist 01/16/2022 1055 by Korver Graybeal, Clarene Duke, OT Flowsheets (Taken 01/16/2022 1055) Pt Will Perform Upper Body Bathing:  with supervision  sitting Goal: Pt. Will Perform Lower Body Bathing Flowsheets (Taken 01/16/2022 1057) Pt Will Perform Lower Body Bathing:  with min assist  sitting/lateral leans  with adaptive equipment  sit to/from stand Goal: Pt. Will Perform Upper Body Dressing Flowsheets (Taken 01/16/2022 1057) Pt Will Perform Upper Body Dressing:  with min guard assist  sitting Goal: Pt. Will Perform Lower Body Dressing Flowsheets (Taken 01/16/2022 1057) Pt Will Perform Lower Body Dressing:  with min assist  sit to/from stand  sitting/lateral leans  with adaptive equipment Goal: Pt. Will Transfer To Toilet Flowsheets (Taken 01/16/2022 1057) Pt Will Transfer to Toilet:  with min assist  ambulating  bedside commode Goal: Pt. Will Perform Toileting-Clothing Manipulation Flowsheets (Taken 01/16/2022 1057) Pt Will Perform Toileting - Clothing Manipulation and hygiene:  with min assist  sit to/from stand  sitting/lateral leans Goal: Pt/Caregiver Will Perform Home Exercise Program Flowsheets (Taken 01/16/2022 1057) Pt/caregiver will Perform Home Exercise Program:  Left upper extremity  With Supervision  With written HEP provided  Increased strength  Increased ROM

## 2022-01-16 NOTE — Assessment & Plan Note (Addendum)
-  Continue to follow-up with oncology as outpatient -Has chronic leukocytosis

## 2022-01-16 NOTE — Evaluation (Signed)
Physical Therapy Evaluation Patient Details Name: Daniel Osborne MRN: [id number] DOB: [birth year] Today's Date: 01/16/2022  History of Present Illness  Daniel Osborne is a 76 y.o. male with medical history significant of  leukemia, hypertension, CVA, hypokalemia, hypertension, who presents to the emergency department today for evaluation of stroke symptoms.  Last known well was 1030 last night when the patient went to bed.  He woke up around 1 AM and noticed that his left arm is weaker than normal.  He went back to bed and when he woke up at this morning he noted that his left arm was even weaker than before and his left leg is also weak.  He denies any visual changes.  Of note, he recently had a stroke last week and was admitted to the hospital from 2/6 - 2/10.  He underwent stenting of the left ICA at that time.  He was noted to have residual left facial droop, some dysarthria and some mild difficulty with dexterity of the left hand.  He notes his symptoms are much worse than when he was discharged from the hospital.   Clinical Impression  Patient presents with significant L side weakness compared to previous hospitalization.  He needs Assistance for safety with all bed mobility, transfers and ambulation.  He presents with R trunk lean and noted decreased control L LE with transfers and ambulation.  He has decreased grip L hand and needs Assistance to hold to the RW and for steering the walker as well as min to mod assistance for balance x 50 ft. Patient will benefit from continued skilled PT services to address L side weakness, transfer and gait training and fall prevention.       Recommendations for follow up therapy are one component of a multi-disciplinary discharge planning process, led by the attending physician.  Recommendations may be updated based on patient status, additional functional criteria and insurance authorization.  Follow Up Recommendations Acute inpatient rehab (3hours/day)     Assistance Recommended at Discharge Frequent or constant Supervision/Assistance  Patient can return home with the following  A lot of help with walking and/or transfers;A lot of help with bathing/dressing/bathroom;Help with stairs or ramp for entrance    Equipment Recommendations None recommended by PT;Other (comment) (Patient states he has RW and cane and BSC at home; also mother in law lives next door and has additional equipement if needed)  Recommendations for Other Services       Functional Status Assessment Patient has had a recent decline in their functional status and demonstrates the ability to make significant improvements in function in a reasonable and predictable amount of time.     Precautions / Restrictions Precautions Precautions: Fall Precaution Comments: Left side hemiparesis Restrictions Weight Bearing Restrictions: No      Mobility  Bed Mobility Overal bed mobility: Needs Assistance Bed Mobility: Supine to Sit     Supine to sit: Mod assist     General bed mobility comments: needs verbal and tactile cues for technique    Transfers Overall transfer level: Needs assistance Equipment used: Rolling walker (2 wheels) Transfers: Sit to/from Stand, Bed to chair/wheelchair/BSC Sit to Stand: Mod assist   Step pivot transfers: Mod assist       General transfer comment: patient needs VCs for technique; noted R trunk lean and needs min to mod Assistance to maintain balance    Ambulation/Gait Ambulation/Gait assistance: Mod assist Gait Distance (Feet): 50 Feet Assistive device: Rolling walker (2 wheels), IV Pole Gait Pattern/deviations:  Decreased step length - left, Decreased dorsiflexion - left, Decreased weight shift to left, Scissoring, Ataxic, Drifts right/left Gait velocity: less than normal     General Gait Details: patient needs min to mod A for balance and for steering the walker; patient with decreased grip on L handle of the walker and needs min  A occassionally to maintain grip; decreased control of L LE  Stairs            Wheelchair Mobility    Modified Rankin (Stroke Patients Only)       Balance Overall balance assessment: Needs assistance Sitting-balance support: Bilateral upper extremity supported, Feet supported Sitting balance-Leahy Scale: Good   Postural control: Right lateral lean Standing balance support: Bilateral upper extremity supported, Reliant on assistive device for balance Standing balance-Leahy Scale: Fair Standing balance comment: fair to good balance standing with RW; fair with ambulation with RW and min to mod Assistance                             Pertinent Vitals/Pain Pain Assessment Pain Assessment: No/denies pain    Home Living Family/patient expects to be discharged to:: Private residence Living Arrangements: Spouse/significant other Available Help at Discharge: Available 24 hours/day Type of Home: Mobile home Home Access: Stairs to enter Entrance Stairs-Rails: Right;Left;Can reach both Entrance Stairs-Number of Steps: 2   Home Layout: One level Home Equipment: Public relations account executive (2 wheels);Cane - single point Additional Comments: Pt reports that his mother-in-law does have additional equipment (DME) that he has access to if needed.    Prior Function Prior Level of Function : Independent/Modified Independent;Working/employed             Mobility Comments: No difficulty prior. ADLs Comments: Drives, works part time driving a truck     Journalist, newspaper   Dominant Hand: Right    Extremity/Trunk Assessment   Upper Extremity Assessment Upper Extremity Assessment: Defer to OT evaluation LUE Deficits / Details: Very limited muscle activation. Shoulder flexion 1/5, scapular elevation/retraction 0/5, shoulder extension 1/5, elbow flexion/extension: 0/5, wrist flexion/extension 0/5. Able to form a loose gross grasp. Unable to extend fingers of hand. P/ROM shoulder  flexion to approximately 90 degrees before increased tone is felt. unable to complete passive er due to tone. Full passive elbow flexion/extension, wrist flexion/extension, composite finger extension achieved. LUE Sensation: WNL LUE Coordination: decreased fine motor;decreased gross motor    Lower Extremity Assessment Lower Extremity Assessment: Defer to PT evaluation LLE Deficits / Details: no visible active dorsiflexion of L foot/ankle; able to extend L knee partially 3-/5; L hip flexion 3-/5 LLE Sensation: WNL LLE Coordination: decreased gross motor    Cervical / Trunk Assessment Cervical / Trunk Assessment: Other exceptions Cervical / Trunk Exceptions: R trunk lean  Communication   Communication: No difficulties  Cognition Arousal/Alertness: Awake/alert Behavior During Therapy: WFL for tasks assessed/performed Overall Cognitive Status: Within Functional Limits for tasks assessed                                 General Comments: good insight        General Comments      Exercises     Assessment/Plan    PT Assessment Patient needs continued PT services  PT Problem List Decreased strength;Decreased balance;Decreased mobility;Impaired sensation       PT Treatment Interventions Balance training;Gait training;Neuromuscular re-education;Stair training;Functional mobility training;Patient/family education;Therapeutic activities;Therapeutic exercise  PT Goals (Current goals can be found in the Care Plan section)  Acute Rehab PT Goals Patient Stated Goal: be able to return home safely PT Goal Formulation: With patient Time For Goal Achievement: 01/30/22 Potential to Achieve Goals: Good    Frequency Min 5X/week     Co-evaluation PT/OT/SLP Co-Evaluation/Treatment: Yes Reason for Co-Treatment: To address functional/ADL transfers PT goals addressed during session: Mobility/safety with mobility;Balance;Proper use of DME OT goals addressed during session:  ADL's and self-care;Strengthening/ROM;Proper use of Adaptive equipment and DME       AM-PAC PT "6 Clicks" Mobility  Outcome Measure Help needed turning from your back to your side while in a flat bed without using bedrails?: A Little Help needed moving from lying on your back to sitting on the side of a flat bed without using bedrails?: A Lot Help needed moving to and from a bed to a chair (including a wheelchair)?: A Lot Help needed standing up from a chair using your arms (e.g., wheelchair or bedside chair)?: A Lot Help needed to walk in hospital room?: A Lot Help needed climbing 3-5 steps with a railing? : A Lot 6 Click Score: 13    End of Session Equipment Utilized During Treatment: Gait belt Activity Tolerance: Patient tolerated treatment well Patient left: in chair;with call bell/phone within reach Nurse Communication: Mobility status PT Visit Diagnosis: Unsteadiness on feet (R26.81);Other abnormalities of gait and mobility (R26.89);Muscle weakness (generalized) (M62.81);Ataxic gait (R26.0) Hemiplegia - Right/Left: Left Hemiplegia - dominant/non-dominant: Non-dominant Hemiplegia - caused by: Cerebral infarction    Time: 0853-0920 PT Time Calculation (min) (ACUTE ONLY): 27 min   Charges:   PT Evaluation $PT Eval Moderate Complexity: 1 Mod PT Treatments $Therapeutic Activity: 23-37 mins       12:53 PM, 01/16/22 Jory Welke [facility] [place] [phone] [phone]

## 2022-01-16 NOTE — Progress Notes (Signed)
Inpatient Rehab Admissions Coordinator:   Consult received and chart reviewed.  Note possibly increased hemiparesis 2/2 hypoperfusion, which would expect to rebound with BP management.  Will follow 1 more therapy session to gauge progress, then f/u with patient tomorrow.    Shann Medal, PT, DPT Admissions Coordinator [phone] 01/16/22  2:57 PM

## 2022-01-16 NOTE — Assessment & Plan Note (Deleted)
Replace, trend

## 2022-01-17 DIAGNOSIS — I63232 Cerebral infarction due to unspecified occlusion or stenosis of left carotid arteries: Secondary | ICD-10-CM

## 2022-01-17 LAB — BASIC METABOLIC PANEL
Anion gap: 9 (ref 5–15)
BUN: 16 mg/dL (ref 8–23)
CO2: 17 mmol/L — ABNORMAL LOW (ref 22–32)
Calcium: 8.3 mg/dL — ABNORMAL LOW (ref 8.9–10.3)
Chloride: 115 mmol/L — ABNORMAL HIGH (ref 98–111)
Creatinine, Ser: 0.99 mg/dL (ref 0.61–1.24)
GFR, Estimated: >60 mL/min (ref >60)
Glucose, Bld: 94 mg/dL (ref 70–99)
Potassium: 3.6 mmol/L (ref 3.5–5.1)
Sodium: 141 mmol/L (ref 135–145)

## 2022-01-17 LAB — MAGNESIUM: Magnesium: 1.7 mg/dL (ref 1.7–2.4)

## 2022-01-17 MED ORDER — MIDODRINE HCL 5 MG PO TABS
10.0000 mg | ORAL_TABLET | Freq: Three times a day (TID) | ORAL | Status: DC
  Administered 2022-01-17 – 2022-01-18 (×4): 10 mg via ORAL
  Filled 2022-01-17 (×4): qty 2

## 2022-01-17 NOTE — Progress Notes (Signed)
Inpatient Rehab Admissions Coordinator:   Spoke to pt's spouse and son on the phone to discuss CIR.  Spouse is hoping for something closer to home as she provides childcare during the day.  I let the [facility] team know and I will follow for their decision of CIR vs SNF vs home.    Shann Medal, PT, DPT Admissions Coordinator [phone] 01/17/22  4:04 PM

## 2022-01-17 NOTE — Progress Notes (Signed)
Reviewed chart. Pt reportedly improving, now off of neosynephrine (only just stopped). Hopefully we will be able to keep BP up with only midodrine, but would continue to use strict > 130 through today. If still doing well tomorrow, could consider dropping lower goal to 120.   Will continue to follow.   Roland Rack, MD [facility] [phone]  If 7pm- 7am, please page neurology on call as listed in [place].

## 2022-01-17 NOTE — Progress Notes (Signed)
Physical Therapy Treatment Patient Details Name: Daniel Osborne MRN: [id number] DOB: 04-27-1946 Today's Date: 01/17/2022   History of Present Illness Daniel Osborne is a 76 y.o. male with medical history significant of  leukemia, hypertension, CVA, hypokalemia, hypertension, who presents to the emergency department today for evaluation of stroke symptoms.  Last known well was 1030 last night when the patient went to bed.  He woke up around 1 AM and noticed that his left arm is weaker than normal.  He went back to bed and when he woke up at this morning he noted that his left arm was even weaker than before and his left leg is also weak.  He denies any visual changes.  Of note, he recently had a stroke last week and was admitted to the hospital from 2/6 - 2/10.  He underwent stenting of the left ICA at that time.  He was noted to have residual left facial droop, some dysarthria and some mild difficulty with dexterity of the left hand.  He notes his symptoms are much worse than when he was discharged from the hospital.    PT Comments    Patient presents with continued L side weakness/ L side hemiplegia. Patient needs min to mod A to transfer chair to bed. On initially standing he tends to lean to the Right and needs RW and gait belt and min A to occasional mod A for safety with ambulation with RW x 60 ft.  He does demonstrate improved control L LE today however he remains at high fall risk secondary to R trunk lean, decreased control L LE and L LE weakness, foot drop, needs increased assistance with turns and direction changes. Patient will benefit from continued skilled PT services to address  L side/L LE weakness, transfer and gait training and fall prevention.     Recommendations for follow up therapy are one component of a multi-disciplinary discharge planning process, led by the attending physician.  Recommendations may be updated based on patient status, additional functional criteria and  insurance authorization.  Follow Up Recommendations  Acute inpatient rehab (3hours/day)     Assistance Recommended at Discharge Frequent or constant Supervision/Assistance  Patient can return home with the following A lot of help with walking and/or transfers;A lot of help with bathing/dressing/bathroom;Help with stairs or ramp for entrance   Equipment Recommendations  None recommended by PT;Other (comment)    Recommendations for Other Services       Precautions / Restrictions Precautions Precautions: Fall Precaution Comments: Left side hemiparesis Restrictions Weight Bearing Restrictions: No     Mobility  Bed Mobility                    Transfers Overall transfer level: Needs assistance Equipment used: Rolling walker (2 wheels) Transfers: Sit to/from Stand, Bed to chair/wheelchair/BSC Sit to Stand: Min assist, Mod assist   Step pivot transfers: Min assist, Mod assist       General transfer comment: patient with noted improvement with smoothness of transfer today but remains at high fall risk due to R trunk lean; min to mod A needed for safety    Ambulation/Gait Ambulation/Gait assistance: Mod assist Gait Distance (Feet): 60 Feet Assistive device: Rolling walker (2 wheels), IV Pole Gait Pattern/deviations: Decreased step length - left, Decreased dorsiflexion - left, Decreased weight shift to left, Scissoring, Ataxic, Drifts right/left Gait velocity: less than normal     General Gait Details: patient with noted slight improvement with L LE control however  continues with significant trunk lean to the right especially with turns and direction changes. decreased control L LE;  L drop foot. patient needs A at all times for safety.   Stairs             Wheelchair Mobility    Modified Rankin (Stroke Patients Only)       Balance Overall balance assessment: Needs assistance Sitting-balance support: Bilateral upper extremity supported, Feet  supported     Postural control: Right lateral lean Standing balance support: Bilateral upper extremity supported, Reliant on assistive device for balance Standing balance-Leahy Scale: Fair Standing balance comment: fair to good balance standing with RW; fair with ambulation with RW and min to [employer]; needs some A for steering.                            Cognition Arousal/Alertness: Awake/alert Behavior During Therapy: WFL for tasks assessed/performed Overall Cognitive Status: Within Functional Limits for tasks assessed                                 General Comments: good insight        Exercises General Exercises - Lower Extremity Hip ABduction/ADduction: AAROM, Left, 5 reps Hip Flexion/Marching: AAROM, Left, Seated, 10 reps Toe Raises: AAROM, PROM, Left, Seated, 10 reps Heel Raises: AAROM, Left, Seated, 10 reps Other Exercises Other Exercises: PT instructed patient in AROM R LE heel/toe raises, LAQs, hip flexion and hip ABD/ADD in sitting.    General Comments        Pertinent Vitals/Pain Pain Assessment Pain Assessment: No/denies pain    Home Living                          Prior Function            PT Goals (current goals can now be found in the care plan section) Acute Rehab PT Goals Patient Stated Goal: be able to return home safely PT Goal Formulation: With patient Time For Goal Achievement: 01/30/22 Potential to Achieve Goals: Good Progress towards PT goals: Progressing toward goals    Frequency    Min 5X/week      PT Plan Other (comment) (Patient would benefit from continued skilled PT services to address L side weakness, ataxia, transfers, gait and fall prevention.)    Co-evaluation     PT goals addressed during session: Mobility/safety with mobility;Balance;Proper use of DME;Strengthening/ROM        AM-PAC PT "6 Clicks" Mobility   Outcome Measure  Help needed turning from your back to your  side while in a flat bed without using bedrails?: A Little Help needed moving from lying on your back to sitting on the side of a flat bed without using bedrails?: A Lot Help needed moving to and from a bed to a chair (including a wheelchair)?: A Little Help needed standing up from a chair using your arms (e.g., wheelchair or bedside chair)?: A Little Help needed to walk in hospital room?: A Lot Help needed climbing 3-5 steps with a railing? : A Lot 6 Click Score: 15    End of Session Equipment Utilized During Treatment: Gait belt Activity Tolerance: Patient tolerated treatment well Patient left: in chair;with call bell/phone within reach Nurse Communication: Mobility status PT Visit Diagnosis: Unsteadiness on feet (R26.81);Other abnormalities of gait and mobility (R26.89);Muscle weakness (generalized) (M62.81);Ataxic  gait (R26.0) Hemiplegia - Right/Left: Left Hemiplegia - dominant/non-dominant: Non-dominant Hemiplegia - caused by: Cerebral infarction     Time: 1055-1120 PT Time Calculation (min) (ACUTE ONLY): 25 min  Charges:  $Gait Training: 8-22 mins $Therapeutic Exercise: 8-22 mins                     11:51 AM, 01/17/22 Ethin Drummond Small [facility] [place] [phone] [phone]

## 2022-01-17 NOTE — Progress Notes (Signed)
°  Progress Note   Patient: Daniel Osborne UXL:[id number] DOB: [birth year] DOA: 01/15/2022     2 DOS: the patient was seen and examined on 01/17/2022   Brief hospital course: Daniel Osborne is a 76 yo male with past medical history significant for CLL, recent admission for CVA, hypertension who presented to the hospital secondary to stroke symptoms.  He was recently hospitalized at [facility] from 2/6 to 2/10 secondary to stroke.  At that time, he presented with left facial droop, left-sided weakness, slurred speech.  Work-up revealed right frontal infarct and left ICA stenosis.  He underwent cerebral angiogram with left ICA stent.  Patient was discharged home with home health.  He woke up around 1 AM on 2/12 and noticed that his left arm was weak.  When he woke up later that morning, he noticed that his left arm as well as left leg was much weaker than during his previous hospitalization.  In the emergency department, patient was evaluated by neurology.  He was admitted to ICU and started on Neo-Synephrine to keep his blood pressure >130.  2/13: Patient states that he is feeling well overall but continues to have left-sided weakness.  2/14: Patient states that the strength on his left side is improving.  He remains on low-dose of Neo-Synephrine this morning.  Assessment and Plan: * CVA (cerebral vascular accident) ([place])- (present on admission) Recent hospitalization status post left ICA stent Appreciate neurology Continue Neo-Synephrine, added midodrine to keep blood pressure goal SBP >130 PT OT recommending CIR placement Continue aspirin, Brilinta, statin  CLL (chronic lymphocytic leukemia) ([place])- (present on admission) Followed by oncology as outpatient Has chronic leukocytosis         Physical Exam: Vitals:   01/17/22 1000 01/17/22 1100 01/17/22 1129 01/17/22 1200  BP: (!) 156/84 (!) 146/66    Pulse: 68 81 62 72  Resp: (!) 23 (!) 22 17 (!) 21  Temp:  98 F (36.7 C)     TempSrc:  Oral    SpO2: 100% 100% 100% 100%  Weight:      Height:       Examination: General exam: Appears calm and comfortable  Respiratory system: Clear to auscultation. Respiratory effort normal. Cardiovascular system: S1 & S2 heard, RRR. No pedal edema. Gastrointestinal system: Abdomen is nondistended, soft and nontender. Normal bowel sounds heard. Central nervous system: Alert and oriented. Weakness of left upper arm 4/5, left lower extremity 3/5  Extremities: Symmetric in appearance bilaterally  Skin: No rashes, lesions or ulcers on exposed skin  Psychiatry: Judgement and insight appear stable. Mood & affect appropriate.    Data Reviewed:   K 3.6, magnesium 1.7  Family Communication: none at bedside   Disposition: Status is: Inpatient Remains inpatient appropriate because: stroke eval, wean neo-synephrine, will need CIR placement           Planned Discharge Destination: Rehab      Author: Dessa Phi, DO 01/17/2022 12:26 PM  For on call review [email or web address].

## 2022-01-17 NOTE — Progress Notes (Signed)
SLP Cancellation Note  Patient Details Name: Daniel Osborne MRN: [id number] DOB: 12/12/1945   Cancelled treatment:       Reason Eval/Treat Not Completed: Other (comment); Pt was evaluated for SLE during his admission last week and was noted to have dysarthria. Pt and his wife indicated that with this admission, his deficits have been restricted to weakness in his left arm and leg. They both indicate that his speech has improved dramatically since last week. He continues to have mild left facial asymmetry with labial spillage with cup sips of liquids and compensates with use of a straw. SLE will be deferred at this time and dysarthria can be addressed in next venue if indicated.   Thank you,  Genene Churn, [place]    [place] 01/17/2022, 5:04 PM

## 2022-01-17 NOTE — TOC Progression Note (Signed)
Transition of Care [facility]) - Progression Note    Patient Details  Name: Daniel Osborne MRN: [id number] Date of Birth: [birth year]  Transition of Care [facility]) CM/SW [place], [place] Phone Number: 01/17/2022, 4:18 PM  Clinical Narrative:    CSW spoke with pts wife about if they would like for pt to go to CIR or SNF once ready for D/C.  CSW explained the difference between CIR, SNF, and [place] services. CSW informed pts wife of the facilities in the area as they are hopeful for something near [place]. Pts wife states that her and son will be coming to the hospital to speak with pt about options this evening. CSW explained that TOC will follow up in the morning for final decision.  Expected Discharge Plan: IP Rehab Facility Barriers to Discharge: Continued Medical Work up  Expected Discharge Plan and Services Expected Discharge Plan: [place]         Expected Discharge Date: 01/20/22                                     Social Determinants of Health (SDOH) Interventions    Readmission Risk Interventions No flowsheet data found.

## 2022-01-17 NOTE — Plan of Care (Signed)
?  Problem: Acute Rehab PT Goals(only PT should resolve) ?Goal: Pt Will Go Supine/Side To Sit ?Outcome: Progressing ?Goal: Patient Will Transfer Sit To/From Stand ?Outcome: Progressing ?Goal: Pt Will Transfer Bed To Chair/Chair To Bed ?Outcome: Progressing ?Goal: Pt Will Ambulate ?Outcome: Progressing ?  ?

## 2022-01-18 DIAGNOSIS — E876 Hypokalemia: Secondary | ICD-10-CM

## 2022-01-18 DIAGNOSIS — C9110 Chronic lymphocytic leukemia of B-cell type not having achieved remission: Secondary | ICD-10-CM

## 2022-01-18 DIAGNOSIS — I10 Essential (primary) hypertension: Secondary | ICD-10-CM

## 2022-01-18 DIAGNOSIS — E785 Hyperlipidemia, unspecified: Secondary | ICD-10-CM | POA: Diagnosis present

## 2022-01-18 DIAGNOSIS — I639 Cerebral infarction, unspecified: Secondary | ICD-10-CM

## 2022-01-18 LAB — CBC
HCT: 40.3 % (ref 39.0–52.0)
Hemoglobin: 12.2 g/dL — ABNORMAL LOW (ref 13.0–17.0)
MCH: 29.9 pg (ref 26.0–34.0)
MCHC: 30.3 g/dL (ref 30.0–36.0)
MCV: 98.8 fL (ref 80.0–100.0)
Platelets: 164 10*3/uL (ref 150–400)
RBC: 4.08 MIL/uL — ABNORMAL LOW (ref 4.22–5.81)
RDW: 15.2 % (ref 11.5–15.5)
WBC: 26.4 10*3/uL — ABNORMAL HIGH (ref 4.0–10.5)
nRBC: 0 % (ref 0.0–0.2)

## 2022-01-18 LAB — BASIC METABOLIC PANEL
Anion gap: 9 (ref 5–15)
BUN: 16 mg/dL (ref 8–23)
CO2: 19 mmol/L — ABNORMAL LOW (ref 22–32)
Calcium: 9 mg/dL (ref 8.9–10.3)
Chloride: 115 mmol/L — ABNORMAL HIGH (ref 98–111)
Creatinine, Ser: 1.14 mg/dL (ref 0.61–1.24)
GFR, Estimated: >60 mL/min (ref >60)
Glucose, Bld: 86 mg/dL (ref 70–99)
Potassium: 3.9 mmol/L (ref 3.5–5.1)
Sodium: 143 mmol/L (ref 135–145)

## 2022-01-18 LAB — MAGNESIUM: Magnesium: 2 mg/dL (ref 1.7–2.4)

## 2022-01-18 MED ORDER — MIDODRINE HCL 5 MG PO TABS: 5.0000 mg | ORAL_TABLET | Freq: Three times a day (TID) | ORAL | 1 refills | Status: AC

## 2022-01-18 MED ORDER — MIDODRINE HCL 5 MG PO TABS
5.0000 mg | ORAL_TABLET | Freq: Three times a day (TID) | ORAL | Status: DC
  Administered 2022-01-18: 5 mg via ORAL
  Filled 2022-01-18: qty 1

## 2022-01-18 MED ORDER — HYDRALAZINE HCL 20 MG/ML IJ SOLN
10.0000 mg | Freq: Once | INTRAMUSCULAR | Status: AC
  Administered 2022-01-18: 10 mg via INTRAVENOUS
  Filled 2022-01-18: qty 1

## 2022-01-18 NOTE — Assessment & Plan Note (Addendum)
-  Advised to follow heart healthy diet -Patient presented with low blood pressure and has in the requiring midodrine to maintain his stability. -Recent CVA with formation a candidate for permissive hypertension allowance to guaranteed appropriate perfusion. -follow VS; lisinopril has been discontinued at time of discharge.

## 2022-01-18 NOTE — TOC Transition Note (Signed)
Transition of Care [facility]) - CM/SW Discharge Note   Patient Details  Name: Daniel Osborne MRN: [id number] Date of Birth: 1946-02-06  Transition of Care [facility]) CM/SW Contact:  Salome Arnt, LCSW Phone Number: 01/18/2022, 3:47 PM   Clinical Narrative:  LCSW discussed d/c plan with pt and pt's wife this morning. Initially they requested SNF instead of CIR to be closer to home. Referral sent out. Later in the day, pt decided he wanted to go home with home health. Pt's wife aware and agreeable. No preference on home health agency. Referred and accepted by [facility] with Alvis Lemmings. MD aware HHPT/OT orders needed. Per MD, d/c today. Wife will pick up pt this evening. RN updated.     Final next level of care: [place] Barriers to Discharge: Barriers Resolved   Patient Goals and CMS Choice        Discharge Placement                  Name of family member notified: wife Patient and family notified of of transfer: 01/18/22  Discharge Plan and Services                          HH Arranged: PT, OT [place] Agency: [place] Date [place]: 01/18/22 Time [place]: [id number] Representative spoke with at [place]: Tommi Rumps  Social Determinants of Health ([place]) Interventions     Readmission Risk Interventions No flowsheet data found.

## 2022-01-18 NOTE — Care Management Important Message (Signed)
Important Message  Patient Details  Name: Daniel Osborne MRN: [id number] Date of Birth: 10-09-1946   Medicare Important Message Given:  N/A - LOS <3 / Initial given by admissions     Tommy Medal 01/18/2022, 3:40 PM

## 2022-01-18 NOTE — Assessment & Plan Note (Signed)
-  Follow heart healthy diet -Continue the use of statins.

## 2022-01-18 NOTE — NC FL2 (Signed)
[place] MEDICAID FL2 LEVEL OF CARE SCREENING TOOL     IDENTIFICATION  Patient Name: Daniel Osborne Birthdate: 10-03-1946 Sex: male Admission Date (Current Location): 01/15/2022  [facility] and [place] Number:  [employer] and Address:  [place] [address], [place]      Provider Number: [id number]  Attending Physician Name and Address:  Barton Dubois, MD  Relative Name and Phone Number:       Current Level of Care: Hospital Recommended Level of Care: [place] Prior Approval Number:    Date Approved/Denied:   PASRR Number: [id number]  Discharge Plan: SNF    Current Diagnoses: Patient Active Problem List   Diagnosis Date Noted   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 See d/c summary for updates.)  AMBULATORY STATUS COMMUNICATION OF NEEDS Skin   Extensive Assist Verbally Surgical wounds                       Personal Care Assistance Level of Assistance  Bathing, Feeding, Dressing Bathing Assistance: Maximum assistance Feeding assistance: Limited assistance Dressing Assistance: Maximum assistance     Functional Limitations Info  Sight, Hearing, Speech Sight Info: Impaired Hearing Info: Adequate Speech Info: Adequate    SPECIAL CARE FACTORS FREQUENCY  PT (By licensed PT), OT (By licensed OT)     PT Frequency: 5x weekly OT Frequency: 5x weekly            Contractures      Additional Factors Info  Code Status, Allergies Code Status Info: Full  code Allergies Info: No known allergies.           Current Medications (01/18/2022):  This is the current hospital active medication list Current Facility-Administered Medications  Medication Dose Route Frequency Provider Last Rate Last Admin   0.9 %  sodium chloride infusion  250 mL Intravenous Continuous Adefeso, Oladapo, DO       acetaminophen (TYLENOL) tablet 500 mg  500 mg Oral Q6H PRN Norins, Heinz Knuckles, MD   500 mg at 01/18/22 7412   aspirin chewable tablet 81 mg  81 mg Oral Daily Norins, Heinz Knuckles, MD   81 mg at 01/17/22 1027   atorvastatin (LIPITOR) tablet 80 mg  80 mg Oral Daily Norins, Heinz Knuckles, MD   80 mg at 01/17/22 1027   Chlorhexidine Gluconate Cloth 2 % PADS 6 each  6 each Topical Q0600 Adefeso, Oladapo, DO   6 each at 01/18/22 0610   heparin injection 5,000 Units  5,000 Units Subcutaneous Q8H Norins, Heinz Knuckles, MD   5,000 Units at 01/18/22 0609   midodrine (PROAMATINE) tablet 10 mg  10 mg Oral TID WC Dessa Phi, DO   10 mg at 01/18/22 8786   psyllium (HYDROCIL/METAMUCIL) 1 packet  1 packet Oral Daily Norins, Heinz Knuckles, MD   1 packet at 01/17/22 1255   ticagrelor (BRILINTA) tablet 90 mg  90 mg Oral BID Neena Rhymes, MD   90 mg at 01/17/22 2104     Discharge Medications: Please  see discharge summary for a list of discharge medications.  Relevant Imaging Results:  Relevant Lab Results:   Additional Information SSN: [id number].  Salome Arnt, LCSW

## 2022-01-18 NOTE — Progress Notes (Signed)
Patient was seen via two way video/audio communication.  Subjective: He reports that he is steadily improving.  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  He is tolerating his current BPs well, and I think we could lower the bottom cut off to 120.  I would favor continuing to maintain his blood pressure strictly above 120, however.  At this point care will consist primarily of blood pressure management.  I would favor continuing blood pressure augmentation as we get his collaterals time to develop.  This could be anywhere in the order of 2 to 4 weeks.  Once he is able to maintain BP without Neo-Synephrine, he could be moved to the floor and rehab.  Recommendations: 1) agree with midodrine to maintain BP 120 - 150 2) continue aspirin/Brilinta  Roland Rack, MD [facility] [phone]  If 7pm- 7am, please page neurology on call as listed in [place].

## 2022-01-18 NOTE — Discharge Summary (Signed)
Physician Discharge Summary   Patient: LORN BUTCHER MRN: [id number] DOB: 1946-08-25  Admit date:     01/15/2022  Discharge date: 01/18/22  Discharge Physician: Barton Dubois   PCP: [facility]   Recommendations at discharge:  Reassess blood pressure and adjust antihypertensive treatment as needed Repeat basic metabolic panel to follow electrolytes and renal function. Make sure patient has follow-up with neurology service as instructed. Repeat CBC to follow hemoglobin and WBCs and stability.  Discharge Diagnoses: Principal Problem:   CVA (cerebral vascular accident) ([place]) Active Problems:   Essential hypertension   CLL (chronic lymphocytic leukemia) ([place])   HLD (hyperlipidemia)  Resolved Problems:   Hypokalemia   Hospital brief admission narrative: As per H&P written by Dr. Linda Hedges on 01/15/2022 Vergia Alcon Parekh is a 76 y.o. male with medical history significant of  leukemia, hypertension, CVA, hypokalemia, hypertension, who presents to the emergency department today for evaluation of stroke symptoms.  Last known well was 1030 last night when the patient went to bed.  He woke up around 1 AM and noticed that his left arm is weaker than normal.  He went back to bed and when he woke up at this morning he noted that his left arm was even weaker than before and his left leg is also weak.  He denies any visual changes.  Of note, he recently had a stroke last week and was admitted to the hospital from 2/6 - 2/10.  He underwent stenting of the left ICA at that time.  He was noted to have residual left facial droop, some dysarthria and some mild difficulty with dexterity of the left hand.  He notes his symptoms are much worse than when he was discharged from the hospital. CAVEAT - this information documented by Dr.Miller, reviewed with the patient and agree.    Assessment and Plan: * CVA (cerebral vascular accident) ([place])- (present on admission) -Recent hospitalization status post  left ICA stent -Appreciate neurology service and recommendations. -Continue the use of midodrine, aspirin, Brilinta and statins. -Patient/family has declined SNF and CIR; patient will go home with home health services for PT, OT, RN and Education officer, [employer]. -Outpatient follow-up with PCP and neurology service. -Blood pressure goal is systolic blood pressure [phone].   HLD (hyperlipidemia)- (present on admission) -Follow heart healthy diet -Continue the use of statins.  CLL (chronic lymphocytic leukemia) ([place])- (present on admission) -Continue to follow-up with oncology as outpatient -Has chronic leukocytosis  Essential hypertension- (present on admission) -Advised to follow heart healthy diet -Patient presented with low blood pressure and has in the requiring midodrine to maintain his stability. -Recent CVA with formation a candidate for permissive hypertension allowance to guaranteed appropriate perfusion. -follow VS; lisinopril has been discontinued at time of discharge.  Hypokalemia -Repleted and within normal limits at discharge -Repeat basic metabolic panel to follow electrolytes trend/stability.  Consultants: Neurology service Procedures performed: See below for x-ray reports. Disposition: Home health Diet recommendation:  Discharge Diet Orders (From admission, onward)     Start     Ordered   01/18/22 0000  Diet - low sodium heart healthy        01/18/22 1759           Cardiac diet  DISCHARGE MEDICATION: Allergies as of 01/18/2022   No Known Allergies      Medication List     STOP taking these medications    lisinopril 10 MG tablet Commonly known as: ZESTRIL       TAKE these  medications    acetaminophen 500 MG tablet Commonly known as: TYLENOL Take 500 mg by mouth every 6 (six) hours as needed.   ALKA-SELTZER ANTACID PO Take 2 tablets by mouth daily as needed. Mix with water   aspirin 81 MG chewable tablet Chew 1 tablet (81 mg total) by mouth  daily.   atorvastatin 80 MG tablet Commonly known as: LIPITOR Take 1 tablet (80 mg total) by mouth daily.   midodrine 5 MG tablet Commonly known as: PROAMATINE Take 1 tablet (5 mg total) by mouth 3 (three) times daily with meals.   multivitamin tablet Take 1 tablet by mouth daily.   psyllium 58.6 % packet Commonly known as: METAMUCIL Take 1 packet by mouth daily.   ticagrelor 90 MG Tabs tablet Commonly known as: BRILINTA Take 1 tablet (90 mg total) by mouth 2 (two) times daily.   Vitamin D3 25 MCG tablet Commonly known as: Vitamin D Take 2,000 Units by mouth daily.        Follow-up Information     [facility] Follow up.   Specialty: Home Health Services Why: Will contact you to schedule home health visits. Contact information: [place] [address] [phone]                 Discharge Exam: Filed Weights   01/15/22 2137 01/17/22 0437 01/18/22 0436  Weight: 73.3 kg 78.4 kg 75 kg   General exam: Alert, awake, oriented x 3, following commands appropriately and expressing improvement in his left-sided weakness.  No new focal deficit.  Asking to go home at time of discharge. Respiratory system: Clear to auscultation. Respiratory effort normal.  No requiring oxygen supplementation. Cardiovascular system:RRR. No rubs, gallops or JVD. Gastrointestinal system: Abdomen is nondistended, soft and nontender. No organomegaly or masses felt. Normal bowel sounds heard. Central nervous system: No new focal neurological deficits.  Improvement in his left side hemiparesis appreciated. Extremities: No cyanosis or clubbing. Skin: No petechiae. Psychiatry: Judgement and insight appear normal. Mood & affect appropriate.    Condition at discharge: stable  The results of significant diagnostics from this hospitalization (including imaging, microbiology, ancillary and laboratory) are listed below for reference.   Imaging Studies: CT  ANGIO HEAD NECK W WO CM  Result Date: 01/09/2022 CLINICAL DATA:  Neuro deficit, acute, stroke suspected EXAM: CT ANGIOGRAPHY HEAD AND NECK TECHNIQUE: Multidetector CT imaging of the head and neck was performed using the standard protocol during bolus administration of intravenous contrast. Multiplanar CT image reconstructions and MIPs were obtained to evaluate the vascular anatomy. Carotid stenosis measurements (when applicable) are obtained utilizing NASCET criteria, using the distal internal carotid diameter as the denominator. RADIATION DOSE REDUCTION: This exam was performed according to the departmental dose-optimization program which includes automated exposure control, adjustment of the mA and/or kV according to patient size and/or use of iterative reconstruction technique. CONTRAST:  71m OMNIPAQUE IOHEXOL 350 MG/ML SOLN COMPARISON:  None. FINDINGS: CT HEAD FINDINGS Brain: Patchy hypoattenuation in the right frontal white matter and bilateral basal ganglia. No acute hemorrhage, mass lesion, midline shift, hydrocephalus or visible extra-axial fluid collection. Mild atrophy. Vascular: See below. Skull: No acute fracture. Sinuses: Small right maxillary sinus with mild mucosal thickening. Otherwise, clear sinuses. Orbits: No acute finding. Review of the MIP images confirms the above findings CTA NECK FINDINGS Aortic arch: Great vessel origins are patent. Right carotid system: Mixed calcific and noncalcific atherosclerosis at the carotid bifurcation. 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 The  ICA remains non-opacified in the neck. Left carotid system: Mixed calcific and noncalcific atherosclerosis at the carotid bifurcation and involving the proximal ICA. Approximately 70-80% stenosis of the proximal ICA. Vertebral arteries: Co dominant. Severe right vertebral artery origin stenosis. Otherwise, vertebral arteries are patent without significant stenosis. Skeleton: No evidence of acute abnormality on limited  assessment. Other neck: Increased number of lymph nodes in the neck bilaterally and in the visualized upper mediastinum with enlarged bilateral submandibular and upper cervical chain nodes. Left level 2 node measures up to 1.5 cm short axis and is rounded. Submandibular nodes measure up to approximately 1.3 cm short axis on the right. Upper chest: Visualized lung apices are clear. Review of the MIP images confirms the above findings CTA HEAD FINDINGS Anterior circulation: Reconstitution of the right ICA at the proximal petrous segment. Bilateral intracranial ICA atherosclerosis with moderate to severe bilateral paraclinoid ICA stenosis. Bilateral MCAs are patent with multifocal mild-to-moderate M1 and M2 MCA stenosis. Small right A1 ACA, probably congenital given prominent left A1 ACA. Otherwise, ACAs are patent without proximal high-grade stenosis Posterior circulation: Bilateral intradural vertebral arteries are patent. Severe stenosis of the distal right intradural vertebral artery. Moderate stenosis of the distal left intradural vertebral artery. Basilar artery is patent with multifocal mild stenosis. Multifocal severe left P1 and P2 PCA stenosis. Moderate right P1 PCA stenosis. Venous sinuses: As permitted by contrast timing, patent. Review of the MIP images confirms the above findings IMPRESSION: CT head: 1. Patchy hypoattenuation in the right frontal white matter and bilateral basal ganglia, which could represent chronic microvascular ischemic disease versus age indeterminate infarcts in the absence of priors. MRI could provide more sensitive evaluation for acute infarct. 2. No evidence of acute hemorrhage. CTA: 1. 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 2. Moderate to severe bilateral paraclinoid ICA stenosis. 3. Multifocal severe left P1 and P2 PCA stenosis. Moderate right P1 PCA stenosis. 4.  Severe right vertebral artery origin stenosis. Also, severe right and moderate left distal intradural vertebral artery stenosis. 5. Approximately 70-80% stenosis of the proximal left ICA in the neck. 6. Multifocal mild-to-moderate bilateral M1 and M2 MCA stenosis 7. Increased number of lymph nodes in the neck bilaterally and in the visualized upper mediastinum with enlarged and somewhat rounded submandibular and upper cervical chain nodes. While nonspecific, findings raise concern for underlying lymphoproliferative disorder. Consider follow-up CT neck and possibly CT chest/abdomen/pelvis for further evaluation. Findings discussed with Ileene Patrick PA via telephone at 7:16 PM Electronically Signed   By: Margaretha Sheffield M.D.   On: 01/09/2022 19:19   MR BRAIN WO CONTRAST  Result Date: 01/10/2022 CLINICAL DATA:  Neuro deficit, acute, stroke suspected EXAM: MRI HEAD WITHOUT CONTRAST TECHNIQUE: Multiplanar, multiecho pulse sequences of the brain and surrounding structures were obtained without intravenous contrast. COMPARISON:  None. FINDINGS: Brain: There is cortical/subcortical reduced diffusion in the right frontal lobe including involvement of lateral precentral gyrus. Patchy and confluent areas of T2 hyperintensity in the supratentorial white matter nonspecific but probably reflect mild to moderate chronic microvascular ischemic changes. There are chronic right frontoparietal cortical infarcts. Small chronic infarct of the right frontal subcortical white matter. Prominent perivascular spaces and probable superimposed chronic small vessel infarcts of the central gray nuclei and white matter. Chronic blood products along the right basal ganglia and adjacent white matter. No intracranial mass or mass effect. There is no hydrocephalus or extra-axial fluid  collection. Prominence of the ventricles and sulci reflects mild parenchymal volume loss. Vascular: Diminished right ICA flow void. Skull and upper cervical spine:  Marrow signal is mildly heterogeneous but otherwise unremarkable. Sinuses/Orbits: Mild mucosal thickening.  Orbits are unremarkable. Other: Sella is unremarkable.  Mastoid air cells are clear. IMPRESSION: Small acute cortical/subcortical right frontal infarcts with involvement of lateral precentral gyrus. 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 Electronically Signed   By: Macy Mis M.D.   On: 01/10/2022 09:41   IR INTRAVSC STENT CERV CAROTID W/EMB-PROT MOD SED  Result Date: 01/13/2022 INDICATION: WADDELL ITEN is a 76 year old male with a past medical history significant for hypertension and chronic leukemia who presented to the [facility] ED on Monday with new onset left sided facial droop, drooling and pocketing of food in the left side of his mouth while eating. CT angiogram of the head and neck showed right ICA occlusion with intracranial reconstitution and left ICA severe stenosis. MRI of the brain was positive for small right MCA territory infarcts. He comes to our service today for a diagnostic cerebral angiogram to confirm CT angiogram findings with possible left carotid stenting in case right ICA chronic occlusion is confirmed. EXAM: ULTRASOUND-GUIDED VASCULAR ACCESS DIAGNOSTIC CEREBRAL ANGIOGRAM LEFT CAROTID STENTING WITH CEREBRAL PROTECTION DEVICE COMPARISON:  CT/CT angiogram of the head and neck January 09, 2022. MEDICATIONS: 5000 units of heparin intravenously ANESTHESIA/SEDATION: The procedure was performed under monitored anesthesia care (MAC). CONTRAST:  150 mL Omnipaque 300 milligram/mL FLUOROSCOPY: Radiation Exposure Index (as provided by the fluoroscopic device): 725.3 mGy Kerma COMPLICATIONS: None immediate. TECHNIQUE: Informed written consent was obtained from the patient after a thorough discussion of the procedural risks, benefits and alternatives. All questions were addressed. Maximal Sterile Barrier Technique was utilized including caps, mask, sterile gowns,  sterile gloves, sterile drape, hand hygiene and skin antiseptic. A timeout was performed prior to the initiation of the procedure. The right groin was prepped and draped in the usual sterile fashion. The right groin region was infiltrated with lidocaine 1%. Using a micropuncture kit and the modified Seldinger technique, access was gained to the right common femoral artery and 5 French sheath was placed. Real-time ultrasound guidance was utilized for vascular access including the acquisition of a permanent ultrasound image documenting patency of the accessed vessel. Under fluoroscopy, a 4 [place] Berenstein 2 catheter was navigated over a 0.035" Terumo Glidewire into the aortic arch. The catheter was placed into the right common carotid artery. Frontal, lateral and bilateral oblique angiograms of the neck were obtained followed by frontal and lateral angiograms of the head. The catheter was then placed into the left subclavian artery. Frontal angiograms of the neck were obtained. The catheter was then advanced into the left vertebral artery. Frontal and lateral angiograms of the head were obtained. The catheter was then navigated into the left common carotid artery. Frontal and lateral angiograms of the neck were obtained. However, the catheter retracted backed into the aortic arch during injection due to tortuosity. The catheter was then exchanged over the wire and under fluoroscopy for a 5 French Simmons 2 glide catheter. The catheter tip was reformed in the aortic arch and placed into the left common carotid artery. Frontal, lateral and bilateral oblique angiograms of the neck were obtained followed by frontal and lateral angiograms of the head. FINDINGS: 1. Right common femoral artery ultrasound: Normal caliber of the right common femoral artery, adequate for vascular access. 2. Right common carotid artery angiograms: Chronic occlusion of the right  internal carotid artery at the distal aspect of the bulb with  reconstitution at the petrous segment supplied by the ascending pharyngeal artery via vidian artery with faint opacification of the right anterior circulation. Anastomosis of the left middle meningeal artery to left anterior cerebral artery in the parietal region is also noted. 3. Left subclavian artery angiogram: Increased tortuosity of the left subclavian artery without hemodynamically significant stenosis. Mild atherosclerotic changes at the proximal right vertebral artery without hemodynamically significant stenosis. 4. Left vertebral artery angiograms: Atherosclerotic changes at the distal left vertebral artery resulting in approximately 60% stenosis near the vertebrobasilar junction. Atherosclerotic changes of the basilar and bilateral posterior cerebral arteries. Degree of stenosis is difficult to assess due to patient's motion. Collateral circulation from the right PCA to the right ACA via posterior pericallosal arcade and to the ACA and MCA territory via leptomeningeal collaterals and diminutive PCOMs. 5. Left common carotid artery angiograms: Increased tortuosity of the proximal left common carotid artery. Atherosclerotic changes in the left carotid bifurcation resulting in approximately 65% stenosis. Contrast opacification of the left MCA and ACA vascular tree seen with faint contrast opacification of the right MCA and ACA vascular tree via anterior communicating artery. Atherosclerotic changes of the left carotid siphon, left M1-M2/MCA segments and left A2-A3/ACA segment without hemodynamically significant stenosis. PROCEDURE: Frontal and lateral angiograms of the neck were obtained and utilized as biplane roadmap. The 5 [place] Sim 2 glide catheter was advanced into the left external carotid artery. Under fluoroscopy, the catheter and the femoral sheath were removed over a 0.035 inch Terumo Glidewire. Then, a 6 [place] shuttle sheath was advanced into the distal left common carotid artery. Frontal and  lateral angiograms of the neck were obtained and utilized as biplane roadmap. Next, a 2.5-4.8 mm Emboshield NAV 6 cerebral protection device was navigated into the distal cervical segment of the left ICA. Then, a 8-6 x 40 mm XACT carotid stent was deployed across the left carotid bifurcation, covering the area of stenosis. Subsequently, a 5 x 30 mm Viatrac balloon was navigated into the recently deployed stent. Angioplasty was performed under fluoroscopy. Patient developed transient bradycardia rapidly resolved with balloon deflation. The cerebral protection device was then recaptured. Frontal and lateral angiograms of the neck showed adequate stent position with resolution of stenosis. Angiograms with frontal and lateral views of the head showed no evidence of thromboembolic complication and modest improvement of the right MCA and ACA perfusion via anterior communicating artery. Delayed angiograms with frontal and lateral views of the neck showed no evidence of in stent clot formation. The shuttle sheath was then retracted into the right common femoral artery. Angiograms were obtained with right anterior oblique and lateral views. The puncture is at the level of the mid right common femoral artery. The artery has normal caliber, adequate for closure device. The shuttle sheath was exchanged over the wire for a Perclose prostyle which was utilized for access closure. Immediate hemostasis was achieved. IMPRESSION: 1. Successful left carotid bifurcation stenting and angioplasty with cerebral protection device. No evidence of thromboembolic complication. 2. 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 Right anterior circulation is also supplied by posterior circulation via posterior pericallosal arcade, diminutive posterior communicating artery and leptomeningeal collaterals. 3. Moderate stenosis of the left vertebral artery at the  vertebrobasilar junction. PLAN: 1. Patient to continue on dual anti-platelet therapy with Brilinta 90 mg b.i.d. and aspirin 81 mg q.d. 2. A carotid duplex will  be obtained at three-month postprocedure and dual anti-platelet regimen will be re-evaluated at this point. Electronically Signed   By: [name] M.D.   On: 01/13/2022 09:47   IR US Guide Vasc Access Right  Result Date: 01/13/2022 INDICATION: JAYRO MCMATH is a 76 year old male with a past medical history significant for hypertension and chronic leukemia who presented to the [facility] ED on Monday with new onset left sided facial droop, drooling and pocketing of food in the left side of his mouth while eating. CT angiogram of the head and neck showed right ICA occlusion with intracranial reconstitution and left ICA severe stenosis. MRI of the brain was positive for small right MCA territory infarcts. He comes to our service today for a diagnostic cerebral angiogram to confirm CT angiogram findings with possible left carotid stenting in case right ICA chronic occlusion is confirmed. EXAM: ULTRASOUND-GUIDED VASCULAR ACCESS DIAGNOSTIC CEREBRAL ANGIOGRAM LEFT CAROTID STENTING WITH CEREBRAL PROTECTION DEVICE COMPARISON:  CT/CT angiogram of the head and neck January 09, 2022. MEDICATIONS: 5000 units of heparin intravenously ANESTHESIA/SEDATION: The procedure was performed under monitored anesthesia care (MAC). CONTRAST:  150 mL Omnipaque 300 milligram/mL FLUOROSCOPY: Radiation Exposure Index (as provided by the fluoroscopic device): 308.6 mGy Kerma COMPLICATIONS: None immediate. TECHNIQUE: Informed written consent was obtained from the patient after a thorough discussion of the procedural risks, benefits and alternatives. All questions were addressed. Maximal Sterile Barrier Technique was utilized including caps, mask, sterile gowns, sterile gloves, sterile drape, hand hygiene and skin antiseptic. A timeout was performed prior to the  initiation of the procedure. The right groin was prepped and draped in the usual sterile fashion. The right groin region was infiltrated with lidocaine 1%. Using a micropuncture kit and the modified Seldinger technique, access was gained to the right common femoral artery and 5 French sheath was placed. Real-time ultrasound guidance was utilized for vascular access including the acquisition of a permanent ultrasound image documenting patency of the accessed vessel. Under fluoroscopy, a 4 [place] Berenstein 2 catheter was navigated over a 0.035" Terumo Glidewire into the aortic arch. The catheter was placed into the right common carotid artery. Frontal, lateral and bilateral oblique angiograms of the neck were obtained followed by frontal and lateral angiograms of the head. The catheter was then placed into the left subclavian artery. Frontal angiograms of the neck were obtained. The catheter was then advanced into the left vertebral artery. Frontal and lateral angiograms of the head were obtained. The catheter was then navigated into the left common carotid artery. Frontal and lateral angiograms of the neck were obtained. However, the catheter retracted backed into the aortic arch during injection due to tortuosity. The catheter was then exchanged over the wire and under fluoroscopy for a 5 French Simmons 2 glide catheter. The catheter tip was reformed in the aortic arch and placed into the left common carotid artery. Frontal, lateral and bilateral oblique angiograms of the neck were obtained followed by frontal and lateral angiograms of the head. FINDINGS: 1. Right common femoral artery ultrasound: Normal caliber of the right common femoral artery, adequate for vascular access. 2. Right common carotid artery angiograms: Chronic occlusion of the right internal carotid artery at the distal aspect of the bulb with reconstitution at the petrous segment supplied by the ascending pharyngeal artery via vidian artery with  faint opacification of the right anterior circulation. Anastomosis of the left middle meningeal artery to left anterior cerebral artery in the parietal region is also noted. 3. Left  subclavian artery angiogram: Increased tortuosity of the left subclavian artery without hemodynamically significant stenosis. Mild atherosclerotic changes at the proximal right vertebral artery without hemodynamically significant stenosis. 4. Left vertebral artery angiograms: Atherosclerotic changes at the distal left vertebral artery resulting in approximately 60% stenosis near the vertebrobasilar junction. Atherosclerotic changes of the basilar and bilateral posterior cerebral arteries. Degree of stenosis is difficult to assess due to patient's motion. Collateral circulation from the right PCA to the right ACA via posterior pericallosal arcade and to the ACA and MCA territory via leptomeningeal collaterals and diminutive PCOMs. 5. Left common carotid artery angiograms: Increased tortuosity of the proximal left common carotid artery. Atherosclerotic changes in the left carotid bifurcation resulting in approximately 65% stenosis. Contrast opacification of the left MCA and ACA vascular tree seen with faint contrast opacification of the right MCA and ACA vascular tree via anterior communicating artery. Atherosclerotic changes of the left carotid siphon, left M1-M2/MCA segments and left A2-A3/ACA segment without hemodynamically significant stenosis. PROCEDURE: Frontal and lateral angiograms of the neck were obtained and utilized as biplane roadmap. The 5 [place] Sim 2 glide catheter was advanced into the left external carotid artery. Under fluoroscopy, the catheter and the femoral sheath were removed over a 0.035 inch Terumo Glidewire. Then, a 6 [place] shuttle sheath was advanced into the distal left common carotid artery. Frontal and lateral angiograms of the neck were obtained and utilized as biplane roadmap. Next, a 2.5-4.8 mm Emboshield  NAV 6 cerebral protection device was navigated into the distal cervical segment of the left ICA. Then, a 8-6 x 40 mm XACT carotid stent was deployed across the left carotid bifurcation, covering the area of stenosis. Subsequently, a 5 x 30 mm Viatrac balloon was navigated into the recently deployed stent. Angioplasty was performed under fluoroscopy. Patient developed transient bradycardia rapidly resolved with balloon deflation. The cerebral protection device was then recaptured. Frontal and lateral angiograms of the neck showed adequate stent position with resolution of stenosis. Angiograms with frontal and lateral views of the head showed no evidence of thromboembolic complication and modest improvement of the right MCA and ACA perfusion via anterior communicating artery. Delayed angiograms with frontal and lateral views of the neck showed no evidence of in stent clot formation. The shuttle sheath was then retracted into the right common femoral artery. Angiograms were obtained with right anterior oblique and lateral views. The puncture is at the level of the mid right common femoral artery. The artery has normal caliber, adequate for closure device. The shuttle sheath was exchanged over the wire for a Perclose prostyle which was utilized for access closure. Immediate hemostasis was achieved. IMPRESSION: 1. Successful left carotid bifurcation stenting and angioplasty with cerebral protection device. No evidence of thromboembolic complication. 2. 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 Right anterior circulation is also supplied by posterior circulation via posterior pericallosal arcade, diminutive posterior communicating artery and leptomeningeal collaterals. 3. 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 PLAN: 1. Patient to continue on dual anti-platelet therapy with Brilinta 90 mg b.i.d. and  aspirin 81 mg q.d. 2. A carotid duplex will be obtained at three-month postprocedure and dual anti-platelet regimen will be re-evaluated at this point. Electronically Signed   By: [name] M.D.   On: 01/13/2022 09:47   ECHOCARDIOGRAM COMPLETE  Result Date: 01/10/2022    ECHOCARDIOGRAM REPORT   Patient Name:   WINFREY CHILLEMI Date of  Exam: 01/10/2022 Medical Rec #:  [id number]       Height:       69.0 in Accession #:    [id number]      Weight:       170.2 lb Date of Birth:  [birth year]       BSA:          1.929 m Patient Age:    32 years        BP:           141/84 mmHg Patient Gender: M               HR:           81 bpm. Exam Location:  [facility] Procedure: 2D Echo, Cardiac Doppler and Color Doppler Indications:    Stroke I63.9  History:        Patient has no prior history of Echocardiogram examinations.                 Risk Factors:Hypertension. CVA (cerebral vascular accident)                 ([place]).  Sonographer:    Alvino Chapel RCS Referring Phys: [id number] ASIA B [place]  1. Left ventricular ejection fraction, by estimation, is 60 to 65%. The left ventricle has normal function. The left ventricle has no regional wall motion abnormalities. Left ventricular diastolic parameters are consistent with Grade I diastolic dysfunction (impaired relaxation).  2. Right ventricular systolic function is normal. The right ventricular size is normal. Tricuspid regurgitation signal is inadequate for assessing PA pressure.  3. The mitral valve is degenerative. Trivial mitral valve regurgitation. No evidence of mitral stenosis.  4. The aortic valve is tricuspid. Aortic valve regurgitation is trivial. Aortic valve sclerosis is present, with no evidence of aortic valve stenosis.  5. The inferior vena cava is normal in size with greater than 50% respiratory variability, suggesting right atrial pressure of 3 mmHg. Comparison(s): No prior Echocardiogram. FINDINGS  Left Ventricle: Left ventricular  ejection fraction, by estimation, is 60 to 65%. The left ventricle has normal function. The left ventricle has no regional wall motion abnormalities. The left ventricular internal cavity size was normal in size. There is  no left ventricular hypertrophy. Left ventricular diastolic parameters are consistent with Grade I diastolic dysfunction (impaired relaxation). Right Ventricle: The right ventricular size is normal. No increase in right ventricular wall thickness. Right ventricular systolic function is normal. Tricuspid regurgitation signal is inadequate for assessing PA pressure. Left Atrium: Left atrial size was normal in size. Right Atrium: Right atrial size was normal in size. Pericardium: There is no evidence of pericardial effusion. Mitral Valve: The mitral valve is degenerative in appearance. Trivial mitral valve regurgitation. No evidence of mitral valve stenosis. Tricuspid Valve: The tricuspid valve is normal in structure. Tricuspid valve regurgitation is trivial. No evidence of tricuspid stenosis. Aortic Valve: The aortic valve is tricuspid. Aortic valve regurgitation is trivial. Aortic valve sclerosis is present, with no evidence of aortic valve stenosis. Aortic valve mean gradient measures 12.0 mmHg. Aortic valve peak gradient measures 20.6 mmHg. Aortic valve area, by VTI measures 1.42 cm. Pulmonic Valve: The pulmonic valve was not well visualized. Pulmonic valve regurgitation is not visualized. No evidence of pulmonic stenosis. Aorta: The aortic root is normal in size and structure. Venous: The inferior vena cava is normal in size with greater than 50% respiratory variability, suggesting right atrial pressure of 3 mmHg. IAS/Shunts: No atrial level shunt detected  by color flow Doppler.  LEFT VENTRICLE PLAX 2D LVIDd:         4.20 cm   Diastology LVIDs:         2.80 cm   LV e' medial:    7.94 cm/s LV PW:         1.00 cm   LV E/e' medial:  11.6 LV IVS:        0.90 cm   LV e' lateral:   5.98 cm/s LVOT  diam:     1.90 cm   LV E/e' lateral: 15.5 LV SV:         67 LV SV Index:   35 LVOT Area:     2.84 cm  RIGHT VENTRICLE RV S prime:     12.10 cm/s TAPSE (M-mode): 2.1 cm LEFT ATRIUM             Index        RIGHT ATRIUM           Index LA diam:        3.60 cm 1.87 cm/m   RA Area:     15.70 cm LA Vol (A2C):   62.3 ml 32.30 ml/m  RA Volume:   40.20 ml  20.84 ml/m LA Vol (A4C):   45.2 ml 23.43 ml/m LA Biplane Vol: 55.0 ml 28.51 ml/m  AORTIC VALVE AV Area (Vmax):    1.44 cm AV Area (Vmean):   1.28 cm AV Area (VTI):     1.42 cm AV Vmax:           227.00 cm/s AV Vmean:          160.000 cm/s AV VTI:            0.470 m AV Peak Grad:      20.6 mmHg AV Mean Grad:      12.0 mmHg LVOT Vmax:         115.00 cm/s LVOT Vmean:        72.000 cm/s LVOT VTI:          0.235 m LVOT/AV VTI ratio: 0.50  AORTA Ao Root diam: 3.10 cm MITRAL VALVE MV Area (PHT): 3.37 cm     SHUNTS MV Decel Time: 225 msec     Systemic VTI:  0.24 m MV E velocity: 92.50 cm/s   Systemic Diam: 1.90 cm MV A velocity: 111.00 cm/s MV E/A ratio:  0.83 Rudean Haskell MD Electronically signed by Rudean Haskell MD Signature Date/Time: 01/10/2022/12:08:44 PM    Final    CT HEAD CODE STROKE WO CONTRAST  Result Date: 01/15/2022 CLINICAL DATA:  Code stroke. Neuro deficit, acute, stroke suspected. Left-sided weakness and facial droop. History of previous stroke. EXAM: CT HEAD WITHOUT CONTRAST TECHNIQUE: Contiguous axial images were obtained from the base of the skull through the vertex without intravenous contrast. RADIATION DOSE REDUCTION: This exam was performed according to the departmental dose-optimization program which includes automated exposure control, adjustment of the mA and/or kV according to patient size and/or use of iterative reconstruction technique. COMPARISON:  MRI 01/10/2022 and CT 01/09/2022 FINDINGS: Brain: No abnormality seen affecting the brainstem or cerebellum. Left cerebral hemisphere shows chronic small-vessel ischemic changes  of the white matter. On the right, low-density affecting the cortex in the right posterior frontal region is seen as expected in the location of the previous infarction shown by MRI. However, there appears to be extension with involvement of adjacent cortical and subcortical brain towards the vertex. No evidence of mass effect  or hemorrhage. Chronic small-vessel ischemic changes present on the right as well. Vascular: There is atherosclerotic calcification of the major vessels at the base of the brain. Skull: Negative Sinuses/Orbits: No active sinus inflammatory disease. Orbits negative. Other: None ASPECTS ([place] Stroke Program Early CT Score) - Ganglionic level infarction (caudate, lentiform nuclei, internal capsule, insula, M1-M3 cortex): 7 - Supraganglionic infarction (M4-M6 cortex): 2 Total score (0-10 with 10 being normal): 9 IMPRESSION: 1. Low-density evident at the site of previously seen cortical infarction in the right posterior frontal lobe. Extension of infarction since the initial evaluation, with a new area of cortical involvement towards the right frontal vertex. No evidence of mass effect or hemorrhage. 2. ASPECTS is 9 3. These results were called by telephone at the time of interpretation on 01/15/2022 at 4:42 pm to provider Dr. Sabra Heck, who verbally acknowledged these results. Electronically Signed   By: Nelson Chimes M.D.   On: 01/15/2022 16:45   CT ANGIO HEAD NECK W WO CM W PERF (CODE STROKE)  Result Date: 01/15/2022 CLINICAL DATA:  Neuro deficit, acute, stroke suspected. Left-sided weakness and facial droop. History of a recent stroke and left carotid bifurcation stenting. EXAM: CT ANGIOGRAPHY HEAD AND NECK CT PERFUSION BRAIN TECHNIQUE: Multidetector CT imaging of the head and neck was performed using the standard protocol during bolus administration of intravenous contrast. Multiplanar CT image reconstructions and MIPs were obtained to evaluate the vascular anatomy. Carotid stenosis  measurements (when applicable) are obtained utilizing NASCET criteria, using the distal internal carotid diameter as the denominator. Multiphase CT imaging of the brain was performed following IV bolus contrast injection. Subsequent parametric perfusion maps were calculated using RAPID software. RADIATION DOSE REDUCTION: This exam was performed according to the departmental dose-optimization program which includes automated exposure control, adjustment of the mA and/or kV according to patient size and/or use of iterative reconstruction technique. CONTRAST:  193m OMNIPAQUE IOHEXOL 350 MG/ML SOLN COMPARISON:  Head and neck CTA 01/09/2022 FINDINGS: CTA NECK FINDINGS Aortic arch: Standard 3 vessel aortic arch with mild atherosclerotic plaque. No significant arch vessel origin stenosis. Right carotid system: Patent common carotid artery. 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 Left carotid system: Interval stenting of the left carotid bifurcation with the stent being patent and without a significant common carotid stenosis more proximally or ICA stenosis more distally. Vertebral arteries: Patent and codominant with unchanged severe right and at most mild left vertebral artery origin stenoses. Skeleton: No acute osseous abnormality or suspicious osseous lesion. Other neck: Slightly decreased size mildly enlarged cervical lymph nodes since the recent prior CT, for example a left level II lymph node now measures 1.2 cm in short axis (previously 1.5 cm). Upper chest: No apical lung consolidation or mass. Review of the MIP images confirms the above findings CTA HEAD FINDINGS Anterior circulation: There is reconstitution of the right ICA beginning in the proximal petrous segment. Moderate to severe bilateral paraclinoid ICA stenoses are similar to the prior CTA. ACAs and MCAs are patent with unchanged moderate bilateral M1 and M2 stenoses. The right A1 segment remains diminutive,  likely congenitally hypoplastic. No aneurysm is identified. Posterior circulation: The intracranial vertebral arteries are patent to the basilar with unchanged severe right greater than left distal V4 stenoses. Patent PICA and SCA origins are seen bilaterally. The basilar artery is patent with atherosclerotic irregularity but no significant stenosis. Posterior communicating arteries are diminutive or absent. Both PCAs are patent with unchanged moderate right P1 and moderate to severe  left P1 and P2 stenoses. No aneurysm is identified. Venous sinuses: As permitted by contrast timing, patent. Anatomic variants: None. Review of the MIP images confirms the above findings CT Brain Perfusion Findings: ASPECTS: 9 CBF (<30%) Volume: 6 mL Perfusion (Tmax>6.0s) volume: 232 mL Mismatch Volume: 226 mL Infarction Location: High right frontal lobe IMPRESSION: CTA HEAD AND NECK: 1. No acute large vessel occlusion. 2. 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 3. Patent left carotid bifurcation stent. 4. Unchanged advanced intracranial atherosclerosis as above. 5. 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 6. Mildly decreased cervical lymphadenopathy. 7. Aortic Atherosclerosis (ICD10-I70.0). CT BRAIN PERFUSION: Small core infarct in the high right frontal lobe with delayed perfusion throughout the right MCA territory as above. Electronically Signed   By: Logan Bores M.D.   On: 01/15/2022 17:42   IR ANGIO EXTRACRAN SEL COM CAROTID INNOMINATE UNI BILAT MOD SED  Result Date: 01/13/2022 INDICATION: HEMAN QUE is a 76 year old male with a past medical history significant for hypertension and chronic leukemia who presented to the [facility] ED on Monday with new onset left sided facial droop, drooling and pocketing of food in the left side of his mouth while eating. CT angiogram of the head and neck showed right ICA occlusion with intracranial reconstitution and left ICA severe  stenosis. MRI of the brain was positive for small right MCA territory infarcts. He comes to our service today for a diagnostic cerebral angiogram to confirm CT angiogram findings with possible left carotid stenting in case right ICA chronic occlusion is confirmed. EXAM: ULTRASOUND-GUIDED VASCULAR ACCESS DIAGNOSTIC CEREBRAL ANGIOGRAM LEFT CAROTID STENTING WITH CEREBRAL PROTECTION DEVICE COMPARISON:  CT/CT angiogram of the head and neck January 09, 2022. MEDICATIONS: 5000 units of heparin intravenously ANESTHESIA/SEDATION: The procedure was performed under monitored anesthesia care (MAC). CONTRAST:  150 mL Omnipaque 300 milligram/mL FLUOROSCOPY: Radiation Exposure Index (as provided by the fluoroscopic device): 573.2 mGy Kerma COMPLICATIONS: None immediate. TECHNIQUE: Informed written consent was obtained from the patient after a thorough discussion of the procedural risks, benefits and alternatives. All questions were addressed. Maximal Sterile Barrier Technique was utilized including caps, mask, sterile gowns, sterile gloves, sterile drape, hand hygiene and skin antiseptic. A timeout was performed prior to the initiation of the procedure. The right groin was prepped and draped in the usual sterile fashion. The right groin region was infiltrated with lidocaine 1%. Using a micropuncture kit and the modified Seldinger technique, access was gained to the right common femoral artery and 5 French sheath was placed. Real-time ultrasound guidance was utilized for vascular access including the acquisition of a permanent ultrasound image documenting patency of the accessed vessel. Under fluoroscopy, a 4 [place] Berenstein 2 catheter was navigated over a 0.035" Terumo Glidewire into the aortic arch. The catheter was placed into the right common carotid artery. Frontal, lateral and bilateral oblique angiograms of the neck were obtained followed by frontal and lateral angiograms of the head. The catheter was then placed into the  left subclavian artery. Frontal angiograms of the neck were obtained. The catheter was then advanced into the left vertebral artery. Frontal and lateral angiograms of the head were obtained. The catheter was then navigated into the left common carotid artery. Frontal and lateral angiograms of the neck were obtained. However, the catheter retracted backed into the aortic arch during injection due to tortuosity. The catheter was then exchanged over the wire and under fluoroscopy for a 5 French Simmons 2 glide catheter. The catheter tip was  reformed in the aortic arch and placed into the left common carotid artery. Frontal, lateral and bilateral oblique angiograms of the neck were obtained followed by frontal and lateral angiograms of the head. FINDINGS: 1. Right common femoral artery ultrasound: Normal caliber of the right common femoral artery, adequate for vascular access. 2. 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 Anastomosis of the left middle meningeal artery to left anterior cerebral artery in the parietal region is also noted. 3. Left subclavian artery angiogram: Increased tortuosity of the left subclavian artery without hemodynamically significant stenosis. Mild atherosclerotic changes at the proximal right vertebral artery without hemodynamically significant stenosis. 4. Left vertebral artery angiograms: Atherosclerotic changes at the distal left vertebral artery resulting in approximately 60% stenosis near the vertebrobasilar junction. Atherosclerotic changes of the basilar and bilateral posterior cerebral arteries. Degree of stenosis is difficult to assess due to patient's motion. Collateral circulation from the right PCA to the right ACA via posterior  pericallosal arcade and to the ACA and MCA territory via leptomeningeal collaterals and diminutive PCOMs. 5. Left common carotid artery angiograms: Increased tortuosity of the proximal left common carotid artery. Atherosclerotic changes in the left carotid bifurcation resulting in approximately 65% stenosis. Contrast opacification of the left MCA and ACA vascular tree seen with faint contrast opacification of the right MCA and ACA vascular tree via anterior communicating artery. Atherosclerotic changes of the left carotid siphon, left M1-M2/MCA segments and left A2-A3/ACA segment without hemodynamically significant stenosis. PROCEDURE: Frontal and lateral angiograms of the neck were obtained and utilized as biplane roadmap. The 5 [place] Sim 2 glide catheter was advanced into the left external carotid artery. Under fluoroscopy, the catheter and the femoral sheath were removed over a 0.035 inch Terumo Glidewire. Then, a 6 [place] shuttle sheath was advanced into the distal left common carotid artery. Frontal and lateral angiograms of the neck were obtained and utilized as biplane roadmap. Next, a 2.5-4.8 mm Emboshield NAV 6 cerebral protection device was navigated into the distal cervical segment of the left ICA. Then, a 8-6 x 40 mm XACT carotid stent was deployed across the left carotid bifurcation, covering the area of stenosis. Subsequently, a 5 x 30 mm Viatrac balloon was navigated into the recently deployed stent. Angioplasty was performed under fluoroscopy. Patient developed transient bradycardia rapidly resolved with balloon deflation. The cerebral protection device was then recaptured. Frontal and lateral angiograms of the neck showed adequate stent position with resolution of stenosis. Angiograms with frontal and lateral views of the head showed no evidence of thromboembolic complication and modest improvement of the right MCA and ACA perfusion via anterior communicating artery. Delayed angiograms with frontal  and lateral views of the neck showed no evidence of in stent clot formation. The shuttle sheath was then retracted into the right common femoral artery. Angiograms were obtained with right anterior oblique and lateral views. The puncture is at the level of the mid right common femoral artery. The artery has normal caliber, adequate for closure device. The shuttle sheath was exchanged over the wire for a Perclose prostyle which was utilized for access closure. Immediate hemostasis was achieved. IMPRESSION: 1. Successful left carotid bifurcation stenting and angioplasty with cerebral protection device. No evidence of thromboembolic complication. 2. 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 Right anterior circulation is also supplied by posterior circulation via posterior pericallosal arcade, diminutive posterior communicating artery and leptomeningeal collaterals. 3. 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 PLAN: 1. Patient to continue on dual anti-platelet therapy with Brilinta 90 mg b.i.d. and aspirin 81 mg q.d. 2. A carotid duplex will be obtained at three-month postprocedure and dual anti-platelet regimen will be re-evaluated at this point. Electronically Signed   By: [name] M.D.   On: 01/13/2022 09:47   IR ANGIO VERTEBRAL SEL VERTEBRAL UNI L MOD SED  Result Date: 01/13/2022 INDICATION: LEVANTE SIMONES is a 76 year old male with a past medical history significant for hypertension and chronic leukemia who presented to the [facility] ED on Monday with new onset left sided facial droop, drooling and pocketing of food in the left side of his mouth while eating. CT angiogram of the head and neck showed right ICA occlusion with intracranial reconstitution and left ICA severe stenosis. MRI of the brain was positive for small right MCA territory infarcts. He comes to our  service today for a diagnostic cerebral angiogram to confirm CT angiogram findings with possible left carotid stenting in case right ICA chronic occlusion is confirmed. EXAM: ULTRASOUND-GUIDED VASCULAR ACCESS DIAGNOSTIC CEREBRAL ANGIOGRAM LEFT CAROTID STENTING WITH CEREBRAL PROTECTION DEVICE COMPARISON:  CT/CT angiogram of the head and neck January 09, 2022. MEDICATIONS: 5000 units of heparin intravenously ANESTHESIA/SEDATION: The procedure was performed under monitored anesthesia care (MAC). CONTRAST:  150 mL Omnipaque 300 milligram/mL FLUOROSCOPY: Radiation Exposure Index (as provided by the fluoroscopic device): 643.3 mGy Kerma COMPLICATIONS: None immediate. TECHNIQUE: Informed written consent was obtained from the patient after a thorough discussion of the procedural risks, benefits and alternatives. All questions were addressed. Maximal Sterile Barrier Technique was utilized including caps, mask, sterile gowns, sterile gloves, sterile drape, hand hygiene and skin antiseptic. A timeout was performed prior to the initiation of the procedure. The right groin was prepped and draped in the usual sterile fashion. The right groin region was infiltrated with lidocaine 1%. Using a micropuncture kit and the modified Seldinger technique, access was gained to the right common femoral artery and 5 French sheath was placed. Real-time ultrasound guidance was utilized for vascular access including the acquisition of a permanent ultrasound image documenting patency of the accessed vessel. Under fluoroscopy, a 4 [place] Berenstein 2 catheter was navigated over a 0.035" Terumo Glidewire into the aortic arch. The catheter was placed into the right common carotid artery. Frontal, lateral and bilateral oblique angiograms of the neck were obtained followed by frontal and lateral angiograms of the head. The catheter was then placed into the left subclavian artery. Frontal angiograms of the neck were obtained. The catheter was then  advanced into the left vertebral artery. Frontal and lateral angiograms of the head were obtained. The catheter was then navigated into the left common carotid artery. Frontal and lateral angiograms of the neck were obtained. However, the catheter retracted backed into the aortic arch during injection due to tortuosity. The catheter was then exchanged over the wire and under fluoroscopy for a 5 French Simmons 2 glide catheter. The catheter tip was reformed in the aortic arch and placed into the left common carotid artery. Frontal, lateral and bilateral oblique angiograms of the neck were obtained followed by frontal and lateral angiograms of the head. FINDINGS: 1. Right common femoral artery ultrasound: Normal caliber of the right common femoral artery, adequate for vascular access.  2. 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 Anastomosis of the left middle meningeal artery to left anterior cerebral artery in the parietal region is also noted. 3. Left subclavian artery angiogram: Increased tortuosity of the left subclavian artery without hemodynamically significant stenosis. Mild atherosclerotic changes at the proximal right vertebral artery without hemodynamically significant stenosis. 4. Left vertebral artery angiograms: Atherosclerotic changes at the distal left vertebral artery resulting in approximately 60% stenosis near the vertebrobasilar junction. Atherosclerotic changes of the basilar and bilateral posterior cerebral arteries. Degree of stenosis is difficult to assess due to patient's motion. Collateral circulation from the right PCA to the right ACA via posterior pericallosal arcade and to the ACA and MCA territory via leptomeningeal collaterals and diminutive  PCOMs. 5. Left common carotid artery angiograms: Increased tortuosity of the proximal left common carotid artery. Atherosclerotic changes in the left carotid bifurcation resulting in approximately 65% stenosis. Contrast opacification of the left MCA and ACA vascular tree seen with faint contrast opacification of the right MCA and ACA vascular tree via anterior communicating artery. Atherosclerotic changes of the left carotid siphon, left M1-M2/MCA segments and left A2-A3/ACA segment without hemodynamically significant stenosis. PROCEDURE: Frontal and lateral angiograms of the neck were obtained and utilized as biplane roadmap. The 5 [place] Sim 2 glide catheter was advanced into the left external carotid artery. Under fluoroscopy, the catheter and the femoral sheath were removed over a 0.035 inch Terumo Glidewire. Then, a 6 [place] shuttle sheath was advanced into the distal left common carotid artery. Frontal and lateral angiograms of the neck were obtained and utilized as biplane roadmap. Next, a 2.5-4.8 mm Emboshield NAV 6 cerebral protection device was navigated into the distal cervical segment of the left ICA. Then, a 8-6 x 40 mm XACT carotid stent was deployed across the left carotid bifurcation, covering the area of stenosis. Subsequently, a 5 x 30 mm Viatrac balloon was navigated into the recently deployed stent. Angioplasty was performed under fluoroscopy. Patient developed transient bradycardia rapidly resolved with balloon deflation. The cerebral protection device was then recaptured. Frontal and lateral angiograms of the neck showed adequate stent position with resolution of stenosis. Angiograms with frontal and lateral views of the head showed no evidence of thromboembolic complication and modest improvement of the right MCA and ACA perfusion via anterior communicating artery. Delayed angiograms with frontal and lateral views of the neck showed no evidence of in stent clot formation. The shuttle sheath  was then retracted into the right common femoral artery. Angiograms were obtained with right anterior oblique and lateral views. The puncture is at the level of the mid right common femoral artery. The artery has normal caliber, adequate for closure device. The shuttle sheath was exchanged over the wire for a Perclose prostyle which was utilized for access closure. Immediate hemostasis was achieved. IMPRESSION: 1. Successful left carotid bifurcation stenting and angioplasty with cerebral protection device. No evidence of thromboembolic complication. 2. 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 Right anterior circulation is also supplied by posterior circulation via posterior pericallosal arcade, diminutive posterior communicating artery and leptomeningeal collaterals. 3. 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 PLAN: 1. Patient to continue on dual anti-platelet therapy with Brilinta 90 mg b.i.d.  and aspirin 81 mg q.d. 2. A carotid duplex will be obtained at three-month postprocedure and dual anti-platelet regimen will be re-evaluated at this point. Electronically Signed   By: [name] M.D.   On: 01/13/2022 09:47    Microbiology: Results for orders placed or performed during the hospital encounter of 01/15/22  Resp Panel by RT-PCR (Flu A&B, Covid) Nasopharyngeal Swab     Status: None   Collection Time: 01/15/22  5:49 PM   Specimen: Nasopharyngeal Swab; Nasopharyngeal(NP) swabs in vial transport medium  Result Value Ref Range Status   SARS Coronavirus 2 by RT PCR NEGATIVE NEGATIVE Final    Comment: (NOTE) SARS-CoV-2 target nucleic acids are NOT DETECTED.  The SARS-CoV-2 RNA is generally detectable in upper respiratory specimens during the acute phase of infection. The lowest concentration of SARS-CoV-2 viral copies this assay can detect is 138 copies/mL. A  negative result does not preclude SARS-Cov-2 infection and should not be used as the sole basis for treatment or other patient management decisions. A negative result may occur with  improper specimen collection/handling, submission of specimen other than nasopharyngeal swab, presence of viral mutation(s) within the areas targeted by this assay, and inadequate number of viral copies(<138 copies/mL). A negative result must be combined with clinical observations, patient history, and epidemiological information. The expected result is Negative.  Fact Sheet for Patients:  EntrepreneurPulse.com.au  Fact Sheet for Healthcare Providers:  IncredibleEmployment.be  This test is no t yet approved or cleared by the [place] FDA and  has been authorized for detection and/or diagnosis of SARS-CoV-2 by FDA under an Emergency Use Authorization (EUA). This EUA will remain  in effect (meaning this test can be used) for the duration of the COVID-19 declaration under Section 564(b)(1) of the Act, 21 U.S.C.section 360bbb-3(b)(1), unless the authorization is terminated  or revoked sooner.       Influenza A by PCR NEGATIVE NEGATIVE Final   Influenza B by PCR NEGATIVE NEGATIVE Final    Comment: (NOTE) The Xpert Xpress SARS-CoV-2/FLU/RSV plus assay is intended as an aid in the diagnosis of influenza from Nasopharyngeal swab specimens and should not be used as a sole basis for treatment. Nasal washings and aspirates are unacceptable for Xpert Xpress SARS-CoV-2/FLU/RSV testing.  Fact Sheet for Patients: EntrepreneurPulse.com.au  Fact Sheet for Healthcare Providers: IncredibleEmployment.be  This test is not yet approved or cleared by the [place] FDA and has been authorized for detection and/or diagnosis of SARS-CoV-2 by FDA under an Emergency Use Authorization (EUA). This EUA will remain in effect (meaning this test can  be used) for the duration of the COVID-19 declaration under Section 564(b)(1) of the Act, 21 U.S.C. section 360bbb-3(b)(1), unless the authorization is terminated or revoked.  Performed at [facility], [address] [place], [place] [address]   MRSA Next Gen by PCR, Nasal     Status: None   Collection Time: 01/15/22  8:57 PM   Specimen: Nasal Mucosa; Nasal Swab  Result Value Ref Range Status   MRSA by PCR Next Gen NOT DETECTED NOT DETECTED Final    Comment: (NOTE) The GeneXpert MRSA Assay (FDA approved for NASAL specimens only), is one component of a comprehensive MRSA colonization surveillance program. It is not intended to diagnose MRSA infection nor to guide or monitor treatment for MRSA infections. Test performance is not FDA approved in patients less than 52 years old. Performed at [facility], [address], [place], [place] [address]     Labs: CBC:  Recent Labs  Lab 01/12/22 0201 01/13/22 0352 01/15/22 1612 01/15/22 1627 01/16/22 0824 01/18/22 0425  WBC 39.6* 49.5* 25.0*  --  30.9* 26.4*  NEUTROABS  --   --  4.5  --   --   --   HGB 12.8* 11.8* 11.2* 11.2* 11.4* 12.2*  HCT 39.9 37.4* 35.9* 33.0* 37.2* 40.3  MCV 93.0 94.9 99.2  --  97.9 98.8  PLT 168 198 164  --  189 110   Basic Metabolic Panel: Recent Labs  Lab 01/13/22 0352 01/15/22 1612 01/15/22 1627 01/16/22 0824 01/17/22 0439 01/18/22 0425  NA 137 138 141 141 141 143  K 4.1 3.3* 3.4* 3.4* 3.6 3.9  CL 107 106 108 109 115* 115*  CO2 22 25  --  23 17* 19*  GLUCOSE 92 113* 110* 115* 94 86  BUN 19 33* 30* _0 CREATININE 1.33* 1.46* 1.50* 1.09 0.99 1.14  CALCIUM 8.9 8.4*  --  8.5* 8.3* 9.0  MG  --   --   --   --  1.7 2.0   Liver Function Tests: Recent Labs  Lab 01/15/22 1612  AST 25  ALT 24  ALKPHOS 61  BILITOT 0.2*  PROT 6.3*  ALBUMIN 3.5   CBG: Recent Labs  Lab 01/15/22 1626  GLUCAP 104*    Discharge time spent: greater than 30 minutes.  Signed: Barton Dubois, MD [facility] 01/18/2022

## 2022-01-18 NOTE — Progress Notes (Signed)
Inpatient Rehab Admissions Coordinator:   Discussed with TOC.  Family do wish to pursue SNF so pt can remain closer to home for rehab.  Will sign off for CIR at this time.   Shann Medal, PT, DPT Admissions Coordinator [phone] 01/18/22  1:31 PM

## 2022-01-20 DIAGNOSIS — R300 Dysuria: Secondary | ICD-10-CM | POA: Diagnosis not present

## 2022-01-20 DIAGNOSIS — N139 Obstructive and reflux uropathy, unspecified: Secondary | ICD-10-CM | POA: Diagnosis not present

## 2022-01-23 DIAGNOSIS — I69354 Hemiplegia and hemiparesis following cerebral infarction affecting left non-dominant side: Secondary | ICD-10-CM | POA: Diagnosis not present

## 2022-01-23 DIAGNOSIS — Z9181 History of falling: Secondary | ICD-10-CM | POA: Diagnosis not present

## 2022-01-23 DIAGNOSIS — E785 Hyperlipidemia, unspecified: Secondary | ICD-10-CM | POA: Diagnosis not present

## 2022-01-23 DIAGNOSIS — I69392 Facial weakness following cerebral infarction: Secondary | ICD-10-CM | POA: Diagnosis not present

## 2022-01-23 DIAGNOSIS — Z7982 Long term (current) use of aspirin: Secondary | ICD-10-CM | POA: Diagnosis not present

## 2022-01-23 DIAGNOSIS — I10 Essential (primary) hypertension: Secondary | ICD-10-CM | POA: Diagnosis not present

## 2022-01-25 DIAGNOSIS — I10 Essential (primary) hypertension: Secondary | ICD-10-CM | POA: Diagnosis not present

## 2022-01-25 DIAGNOSIS — I69354 Hemiplegia and hemiparesis following cerebral infarction affecting left non-dominant side: Secondary | ICD-10-CM | POA: Diagnosis not present

## 2022-01-25 DIAGNOSIS — E785 Hyperlipidemia, unspecified: Secondary | ICD-10-CM | POA: Diagnosis not present

## 2022-01-25 DIAGNOSIS — Z9181 History of falling: Secondary | ICD-10-CM | POA: Diagnosis not present

## 2022-01-25 DIAGNOSIS — I69392 Facial weakness following cerebral infarction: Secondary | ICD-10-CM | POA: Diagnosis not present

## 2022-01-25 DIAGNOSIS — Z7982 Long term (current) use of aspirin: Secondary | ICD-10-CM | POA: Diagnosis not present

## 2022-01-26 DIAGNOSIS — I69354 Hemiplegia and hemiparesis following cerebral infarction affecting left non-dominant side: Secondary | ICD-10-CM | POA: Diagnosis not present

## 2022-01-26 DIAGNOSIS — I10 Essential (primary) hypertension: Secondary | ICD-10-CM | POA: Diagnosis not present

## 2022-01-26 DIAGNOSIS — Z7982 Long term (current) use of aspirin: Secondary | ICD-10-CM | POA: Diagnosis not present

## 2022-01-26 DIAGNOSIS — E785 Hyperlipidemia, unspecified: Secondary | ICD-10-CM | POA: Diagnosis not present

## 2022-01-26 DIAGNOSIS — Z9181 History of falling: Secondary | ICD-10-CM | POA: Diagnosis not present

## 2022-01-26 DIAGNOSIS — I69392 Facial weakness following cerebral infarction: Secondary | ICD-10-CM | POA: Diagnosis not present

## 2022-01-30 DIAGNOSIS — E785 Hyperlipidemia, unspecified: Secondary | ICD-10-CM | POA: Diagnosis not present

## 2022-01-30 DIAGNOSIS — I10 Essential (primary) hypertension: Secondary | ICD-10-CM | POA: Diagnosis not present

## 2022-01-30 DIAGNOSIS — Z7982 Long term (current) use of aspirin: Secondary | ICD-10-CM | POA: Diagnosis not present

## 2022-01-30 DIAGNOSIS — I69392 Facial weakness following cerebral infarction: Secondary | ICD-10-CM | POA: Diagnosis not present

## 2022-01-30 DIAGNOSIS — I69354 Hemiplegia and hemiparesis following cerebral infarction affecting left non-dominant side: Secondary | ICD-10-CM | POA: Diagnosis not present

## 2022-01-30 DIAGNOSIS — Z9181 History of falling: Secondary | ICD-10-CM | POA: Diagnosis not present

## 2022-01-31 DIAGNOSIS — Z7982 Long term (current) use of aspirin: Secondary | ICD-10-CM | POA: Diagnosis not present

## 2022-01-31 DIAGNOSIS — I10 Essential (primary) hypertension: Secondary | ICD-10-CM | POA: Diagnosis not present

## 2022-01-31 DIAGNOSIS — E785 Hyperlipidemia, unspecified: Secondary | ICD-10-CM | POA: Diagnosis not present

## 2022-01-31 DIAGNOSIS — I69392 Facial weakness following cerebral infarction: Secondary | ICD-10-CM | POA: Diagnosis not present

## 2022-01-31 DIAGNOSIS — I69354 Hemiplegia and hemiparesis following cerebral infarction affecting left non-dominant side: Secondary | ICD-10-CM | POA: Diagnosis not present

## 2022-01-31 DIAGNOSIS — Z9181 History of falling: Secondary | ICD-10-CM | POA: Diagnosis not present

## 2022-02-01 DIAGNOSIS — E785 Hyperlipidemia, unspecified: Secondary | ICD-10-CM | POA: Diagnosis not present

## 2022-02-01 DIAGNOSIS — I69354 Hemiplegia and hemiparesis following cerebral infarction affecting left non-dominant side: Secondary | ICD-10-CM | POA: Diagnosis not present

## 2022-02-01 DIAGNOSIS — Z9181 History of falling: Secondary | ICD-10-CM | POA: Diagnosis not present

## 2022-02-01 DIAGNOSIS — Z7982 Long term (current) use of aspirin: Secondary | ICD-10-CM | POA: Diagnosis not present

## 2022-02-01 DIAGNOSIS — I10 Essential (primary) hypertension: Secondary | ICD-10-CM | POA: Diagnosis not present

## 2022-02-01 DIAGNOSIS — I69392 Facial weakness following cerebral infarction: Secondary | ICD-10-CM | POA: Diagnosis not present

## 2022-02-02 DIAGNOSIS — I10 Essential (primary) hypertension: Secondary | ICD-10-CM | POA: Diagnosis not present

## 2022-02-02 DIAGNOSIS — I69354 Hemiplegia and hemiparesis following cerebral infarction affecting left non-dominant side: Secondary | ICD-10-CM | POA: Diagnosis not present

## 2022-02-02 DIAGNOSIS — I69392 Facial weakness following cerebral infarction: Secondary | ICD-10-CM | POA: Diagnosis not present

## 2022-02-02 DIAGNOSIS — Z7982 Long term (current) use of aspirin: Secondary | ICD-10-CM | POA: Diagnosis not present

## 2022-02-02 DIAGNOSIS — Z9181 History of falling: Secondary | ICD-10-CM | POA: Diagnosis not present

## 2022-02-02 DIAGNOSIS — E785 Hyperlipidemia, unspecified: Secondary | ICD-10-CM | POA: Diagnosis not present

## 2022-02-06 DIAGNOSIS — I10 Essential (primary) hypertension: Secondary | ICD-10-CM | POA: Diagnosis not present

## 2022-02-06 DIAGNOSIS — I69392 Facial weakness following cerebral infarction: Secondary | ICD-10-CM | POA: Diagnosis not present

## 2022-02-06 DIAGNOSIS — Z9181 History of falling: Secondary | ICD-10-CM | POA: Diagnosis not present

## 2022-02-06 DIAGNOSIS — E785 Hyperlipidemia, unspecified: Secondary | ICD-10-CM | POA: Diagnosis not present

## 2022-02-06 DIAGNOSIS — I69354 Hemiplegia and hemiparesis following cerebral infarction affecting left non-dominant side: Secondary | ICD-10-CM | POA: Diagnosis not present

## 2022-02-06 DIAGNOSIS — Z7982 Long term (current) use of aspirin: Secondary | ICD-10-CM | POA: Diagnosis not present

## 2022-02-07 DIAGNOSIS — I69392 Facial weakness following cerebral infarction: Secondary | ICD-10-CM | POA: Diagnosis not present

## 2022-02-07 DIAGNOSIS — I10 Essential (primary) hypertension: Secondary | ICD-10-CM | POA: Diagnosis not present

## 2022-02-07 DIAGNOSIS — I69354 Hemiplegia and hemiparesis following cerebral infarction affecting left non-dominant side: Secondary | ICD-10-CM | POA: Diagnosis not present

## 2022-02-07 DIAGNOSIS — Z7982 Long term (current) use of aspirin: Secondary | ICD-10-CM | POA: Diagnosis not present

## 2022-02-07 DIAGNOSIS — E785 Hyperlipidemia, unspecified: Secondary | ICD-10-CM | POA: Diagnosis not present

## 2022-02-07 DIAGNOSIS — Z9181 History of falling: Secondary | ICD-10-CM | POA: Diagnosis not present

## 2022-02-09 DIAGNOSIS — I69354 Hemiplegia and hemiparesis following cerebral infarction affecting left non-dominant side: Secondary | ICD-10-CM | POA: Diagnosis not present

## 2022-02-09 DIAGNOSIS — E785 Hyperlipidemia, unspecified: Secondary | ICD-10-CM | POA: Diagnosis not present

## 2022-02-09 DIAGNOSIS — I10 Essential (primary) hypertension: Secondary | ICD-10-CM | POA: Diagnosis not present

## 2022-02-09 DIAGNOSIS — I69392 Facial weakness following cerebral infarction: Secondary | ICD-10-CM | POA: Diagnosis not present

## 2022-02-09 DIAGNOSIS — Z9181 History of falling: Secondary | ICD-10-CM | POA: Diagnosis not present

## 2022-02-09 DIAGNOSIS — Z7982 Long term (current) use of aspirin: Secondary | ICD-10-CM | POA: Diagnosis not present

## 2022-02-10 DIAGNOSIS — Z7982 Long term (current) use of aspirin: Secondary | ICD-10-CM | POA: Diagnosis not present

## 2022-02-10 DIAGNOSIS — I69392 Facial weakness following cerebral infarction: Secondary | ICD-10-CM | POA: Diagnosis not present

## 2022-02-10 DIAGNOSIS — E785 Hyperlipidemia, unspecified: Secondary | ICD-10-CM | POA: Diagnosis not present

## 2022-02-10 DIAGNOSIS — Z9181 History of falling: Secondary | ICD-10-CM | POA: Diagnosis not present

## 2022-02-10 DIAGNOSIS — I10 Essential (primary) hypertension: Secondary | ICD-10-CM | POA: Diagnosis not present

## 2022-02-10 DIAGNOSIS — I69354 Hemiplegia and hemiparesis following cerebral infarction affecting left non-dominant side: Secondary | ICD-10-CM | POA: Diagnosis not present

## 2022-02-13 DIAGNOSIS — I69354 Hemiplegia and hemiparesis following cerebral infarction affecting left non-dominant side: Secondary | ICD-10-CM | POA: Diagnosis not present

## 2022-02-13 DIAGNOSIS — E785 Hyperlipidemia, unspecified: Secondary | ICD-10-CM | POA: Diagnosis not present

## 2022-02-13 DIAGNOSIS — I10 Essential (primary) hypertension: Secondary | ICD-10-CM | POA: Diagnosis not present

## 2022-02-13 DIAGNOSIS — Z9181 History of falling: Secondary | ICD-10-CM | POA: Diagnosis not present

## 2022-02-13 DIAGNOSIS — Z7982 Long term (current) use of aspirin: Secondary | ICD-10-CM | POA: Diagnosis not present

## 2022-02-13 DIAGNOSIS — I69392 Facial weakness following cerebral infarction: Secondary | ICD-10-CM | POA: Diagnosis not present

## 2022-02-14 DIAGNOSIS — I69354 Hemiplegia and hemiparesis following cerebral infarction affecting left non-dominant side: Secondary | ICD-10-CM | POA: Diagnosis not present

## 2022-02-14 DIAGNOSIS — I10 Essential (primary) hypertension: Secondary | ICD-10-CM | POA: Diagnosis not present

## 2022-02-14 DIAGNOSIS — I69392 Facial weakness following cerebral infarction: Secondary | ICD-10-CM | POA: Diagnosis not present

## 2022-02-14 DIAGNOSIS — Z7982 Long term (current) use of aspirin: Secondary | ICD-10-CM | POA: Diagnosis not present

## 2022-02-14 DIAGNOSIS — E785 Hyperlipidemia, unspecified: Secondary | ICD-10-CM | POA: Diagnosis not present

## 2022-02-14 DIAGNOSIS — Z9181 History of falling: Secondary | ICD-10-CM | POA: Diagnosis not present

## 2022-02-16 DIAGNOSIS — I10 Essential (primary) hypertension: Secondary | ICD-10-CM | POA: Diagnosis not present

## 2022-02-16 DIAGNOSIS — Z7982 Long term (current) use of aspirin: Secondary | ICD-10-CM | POA: Diagnosis not present

## 2022-02-16 DIAGNOSIS — E785 Hyperlipidemia, unspecified: Secondary | ICD-10-CM | POA: Diagnosis not present

## 2022-02-16 DIAGNOSIS — I69354 Hemiplegia and hemiparesis following cerebral infarction affecting left non-dominant side: Secondary | ICD-10-CM | POA: Diagnosis not present

## 2022-02-16 DIAGNOSIS — I69392 Facial weakness following cerebral infarction: Secondary | ICD-10-CM | POA: Diagnosis not present

## 2022-02-16 DIAGNOSIS — Z9181 History of falling: Secondary | ICD-10-CM | POA: Diagnosis not present

## 2022-02-20 DIAGNOSIS — Z9181 History of falling: Secondary | ICD-10-CM | POA: Diagnosis not present

## 2022-02-20 DIAGNOSIS — I69354 Hemiplegia and hemiparesis following cerebral infarction affecting left non-dominant side: Secondary | ICD-10-CM | POA: Diagnosis not present

## 2022-02-20 DIAGNOSIS — E785 Hyperlipidemia, unspecified: Secondary | ICD-10-CM | POA: Diagnosis not present

## 2022-02-20 DIAGNOSIS — Z7982 Long term (current) use of aspirin: Secondary | ICD-10-CM | POA: Diagnosis not present

## 2022-02-20 DIAGNOSIS — I69392 Facial weakness following cerebral infarction: Secondary | ICD-10-CM | POA: Diagnosis not present

## 2022-02-20 DIAGNOSIS — I10 Essential (primary) hypertension: Secondary | ICD-10-CM | POA: Diagnosis not present

## 2022-02-21 DIAGNOSIS — Z7982 Long term (current) use of aspirin: Secondary | ICD-10-CM | POA: Diagnosis not present

## 2022-02-21 DIAGNOSIS — Z9181 History of falling: Secondary | ICD-10-CM | POA: Diagnosis not present

## 2022-02-21 DIAGNOSIS — E785 Hyperlipidemia, unspecified: Secondary | ICD-10-CM | POA: Diagnosis not present

## 2022-02-21 DIAGNOSIS — I69392 Facial weakness following cerebral infarction: Secondary | ICD-10-CM | POA: Diagnosis not present

## 2022-02-21 DIAGNOSIS — I10 Essential (primary) hypertension: Secondary | ICD-10-CM | POA: Diagnosis not present

## 2022-02-21 DIAGNOSIS — I69354 Hemiplegia and hemiparesis following cerebral infarction affecting left non-dominant side: Secondary | ICD-10-CM | POA: Diagnosis not present

## 2022-02-22 DIAGNOSIS — I69354 Hemiplegia and hemiparesis following cerebral infarction affecting left non-dominant side: Secondary | ICD-10-CM | POA: Diagnosis not present

## 2022-02-22 DIAGNOSIS — I69392 Facial weakness following cerebral infarction: Secondary | ICD-10-CM | POA: Diagnosis not present

## 2022-02-22 DIAGNOSIS — Z9181 History of falling: Secondary | ICD-10-CM | POA: Diagnosis not present

## 2022-02-22 DIAGNOSIS — E785 Hyperlipidemia, unspecified: Secondary | ICD-10-CM | POA: Diagnosis not present

## 2022-02-22 DIAGNOSIS — I10 Essential (primary) hypertension: Secondary | ICD-10-CM | POA: Diagnosis not present

## 2022-02-22 DIAGNOSIS — Z7982 Long term (current) use of aspirin: Secondary | ICD-10-CM | POA: Diagnosis not present

## 2022-02-24 DIAGNOSIS — E785 Hyperlipidemia, unspecified: Secondary | ICD-10-CM | POA: Diagnosis not present

## 2022-02-24 DIAGNOSIS — I10 Essential (primary) hypertension: Secondary | ICD-10-CM | POA: Diagnosis not present

## 2022-02-24 DIAGNOSIS — I69354 Hemiplegia and hemiparesis following cerebral infarction affecting left non-dominant side: Secondary | ICD-10-CM | POA: Diagnosis not present

## 2022-02-24 DIAGNOSIS — Z9181 History of falling: Secondary | ICD-10-CM | POA: Diagnosis not present

## 2022-02-24 DIAGNOSIS — Z7982 Long term (current) use of aspirin: Secondary | ICD-10-CM | POA: Diagnosis not present

## 2022-02-24 DIAGNOSIS — I69392 Facial weakness following cerebral infarction: Secondary | ICD-10-CM | POA: Diagnosis not present

## 2022-02-27 DIAGNOSIS — Z7982 Long term (current) use of aspirin: Secondary | ICD-10-CM | POA: Diagnosis not present

## 2022-02-27 DIAGNOSIS — I69392 Facial weakness following cerebral infarction: Secondary | ICD-10-CM | POA: Diagnosis not present

## 2022-02-27 DIAGNOSIS — E785 Hyperlipidemia, unspecified: Secondary | ICD-10-CM | POA: Diagnosis not present

## 2022-02-27 DIAGNOSIS — I10 Essential (primary) hypertension: Secondary | ICD-10-CM | POA: Diagnosis not present

## 2022-02-27 DIAGNOSIS — Z9181 History of falling: Secondary | ICD-10-CM | POA: Diagnosis not present

## 2022-02-27 DIAGNOSIS — I69354 Hemiplegia and hemiparesis following cerebral infarction affecting left non-dominant side: Secondary | ICD-10-CM | POA: Diagnosis not present

## 2022-02-28 DIAGNOSIS — Z9181 History of falling: Secondary | ICD-10-CM | POA: Diagnosis not present

## 2022-02-28 DIAGNOSIS — I69354 Hemiplegia and hemiparesis following cerebral infarction affecting left non-dominant side: Secondary | ICD-10-CM | POA: Diagnosis not present

## 2022-02-28 DIAGNOSIS — I69392 Facial weakness following cerebral infarction: Secondary | ICD-10-CM | POA: Diagnosis not present

## 2022-02-28 DIAGNOSIS — Z7982 Long term (current) use of aspirin: Secondary | ICD-10-CM | POA: Diagnosis not present

## 2022-02-28 DIAGNOSIS — E785 Hyperlipidemia, unspecified: Secondary | ICD-10-CM | POA: Diagnosis not present

## 2022-02-28 DIAGNOSIS — I10 Essential (primary) hypertension: Secondary | ICD-10-CM | POA: Diagnosis not present

## 2022-03-01 DIAGNOSIS — Z7982 Long term (current) use of aspirin: Secondary | ICD-10-CM | POA: Diagnosis not present

## 2022-03-01 DIAGNOSIS — E785 Hyperlipidemia, unspecified: Secondary | ICD-10-CM | POA: Diagnosis not present

## 2022-03-01 DIAGNOSIS — Z9181 History of falling: Secondary | ICD-10-CM | POA: Diagnosis not present

## 2022-03-01 DIAGNOSIS — I69392 Facial weakness following cerebral infarction: Secondary | ICD-10-CM | POA: Diagnosis not present

## 2022-03-01 DIAGNOSIS — I10 Essential (primary) hypertension: Secondary | ICD-10-CM | POA: Diagnosis not present

## 2022-03-01 DIAGNOSIS — I69354 Hemiplegia and hemiparesis following cerebral infarction affecting left non-dominant side: Secondary | ICD-10-CM | POA: Diagnosis not present

## 2022-03-02 DIAGNOSIS — I69392 Facial weakness following cerebral infarction: Secondary | ICD-10-CM | POA: Diagnosis not present

## 2022-03-02 DIAGNOSIS — I10 Essential (primary) hypertension: Secondary | ICD-10-CM | POA: Diagnosis not present

## 2022-03-02 DIAGNOSIS — Z9181 History of falling: Secondary | ICD-10-CM | POA: Diagnosis not present

## 2022-03-02 DIAGNOSIS — E785 Hyperlipidemia, unspecified: Secondary | ICD-10-CM | POA: Diagnosis not present

## 2022-03-02 DIAGNOSIS — I69354 Hemiplegia and hemiparesis following cerebral infarction affecting left non-dominant side: Secondary | ICD-10-CM | POA: Diagnosis not present

## 2022-03-02 DIAGNOSIS — Z7982 Long term (current) use of aspirin: Secondary | ICD-10-CM | POA: Diagnosis not present

## 2022-03-06 DIAGNOSIS — I69392 Facial weakness following cerebral infarction: Secondary | ICD-10-CM | POA: Diagnosis not present

## 2022-03-06 DIAGNOSIS — I10 Essential (primary) hypertension: Secondary | ICD-10-CM | POA: Diagnosis not present

## 2022-03-06 DIAGNOSIS — Z7982 Long term (current) use of aspirin: Secondary | ICD-10-CM | POA: Diagnosis not present

## 2022-03-06 DIAGNOSIS — I69354 Hemiplegia and hemiparesis following cerebral infarction affecting left non-dominant side: Secondary | ICD-10-CM | POA: Diagnosis not present

## 2022-03-06 DIAGNOSIS — E785 Hyperlipidemia, unspecified: Secondary | ICD-10-CM | POA: Diagnosis not present

## 2022-03-06 DIAGNOSIS — Z9181 History of falling: Secondary | ICD-10-CM | POA: Diagnosis not present

## 2022-03-07 DIAGNOSIS — Z9181 History of falling: Secondary | ICD-10-CM | POA: Diagnosis not present

## 2022-03-07 DIAGNOSIS — Z7982 Long term (current) use of aspirin: Secondary | ICD-10-CM | POA: Diagnosis not present

## 2022-03-07 DIAGNOSIS — I10 Essential (primary) hypertension: Secondary | ICD-10-CM | POA: Diagnosis not present

## 2022-03-07 DIAGNOSIS — E785 Hyperlipidemia, unspecified: Secondary | ICD-10-CM | POA: Diagnosis not present

## 2022-03-07 DIAGNOSIS — I69392 Facial weakness following cerebral infarction: Secondary | ICD-10-CM | POA: Diagnosis not present

## 2022-03-07 DIAGNOSIS — I69354 Hemiplegia and hemiparesis following cerebral infarction affecting left non-dominant side: Secondary | ICD-10-CM | POA: Diagnosis not present

## 2022-03-08 DIAGNOSIS — E785 Hyperlipidemia, unspecified: Secondary | ICD-10-CM | POA: Diagnosis not present

## 2022-03-08 DIAGNOSIS — I69354 Hemiplegia and hemiparesis following cerebral infarction affecting left non-dominant side: Secondary | ICD-10-CM | POA: Diagnosis not present

## 2022-03-08 DIAGNOSIS — I69392 Facial weakness following cerebral infarction: Secondary | ICD-10-CM | POA: Diagnosis not present

## 2022-03-08 DIAGNOSIS — Z9181 History of falling: Secondary | ICD-10-CM | POA: Diagnosis not present

## 2022-03-08 DIAGNOSIS — I10 Essential (primary) hypertension: Secondary | ICD-10-CM | POA: Diagnosis not present

## 2022-03-08 DIAGNOSIS — Z7982 Long term (current) use of aspirin: Secondary | ICD-10-CM | POA: Diagnosis not present

## 2022-03-09 ENCOUNTER — Inpatient Hospital Stay: Payer: Medicare Other | Admitting: Adult Health

## 2022-03-13 DIAGNOSIS — I10 Essential (primary) hypertension: Secondary | ICD-10-CM | POA: Diagnosis not present

## 2022-03-13 DIAGNOSIS — Z7982 Long term (current) use of aspirin: Secondary | ICD-10-CM | POA: Diagnosis not present

## 2022-03-13 DIAGNOSIS — I69354 Hemiplegia and hemiparesis following cerebral infarction affecting left non-dominant side: Secondary | ICD-10-CM | POA: Diagnosis not present

## 2022-03-13 DIAGNOSIS — Z9181 History of falling: Secondary | ICD-10-CM | POA: Diagnosis not present

## 2022-03-13 DIAGNOSIS — E785 Hyperlipidemia, unspecified: Secondary | ICD-10-CM | POA: Diagnosis not present

## 2022-03-13 DIAGNOSIS — I69392 Facial weakness following cerebral infarction: Secondary | ICD-10-CM | POA: Diagnosis not present

## 2022-03-17 DIAGNOSIS — Z7982 Long term (current) use of aspirin: Secondary | ICD-10-CM | POA: Diagnosis not present

## 2022-03-17 DIAGNOSIS — I69354 Hemiplegia and hemiparesis following cerebral infarction affecting left non-dominant side: Secondary | ICD-10-CM | POA: Diagnosis not present

## 2022-03-17 DIAGNOSIS — I10 Essential (primary) hypertension: Secondary | ICD-10-CM | POA: Diagnosis not present

## 2022-03-17 DIAGNOSIS — E785 Hyperlipidemia, unspecified: Secondary | ICD-10-CM | POA: Diagnosis not present

## 2022-03-17 DIAGNOSIS — I69392 Facial weakness following cerebral infarction: Secondary | ICD-10-CM | POA: Diagnosis not present

## 2022-03-17 DIAGNOSIS — Z9181 History of falling: Secondary | ICD-10-CM | POA: Diagnosis not present

## 2022-03-19 ENCOUNTER — Other Ambulatory Visit: Payer: Self-pay

## 2022-03-19 ENCOUNTER — Emergency Department (HOSPITAL_COMMUNITY): Payer: Medicare Other

## 2022-03-19 ENCOUNTER — Emergency Department (HOSPITAL_COMMUNITY)
Admission: EM | Admit: 2022-03-19 | Discharge: 2022-03-19 | Disposition: A | Discharge status: Home / Self Care | Payer: Medicare Other | Attending: Emergency Medicine | Admitting: Emergency Medicine

## 2022-03-19 DIAGNOSIS — R55 Syncope and collapse: Secondary | ICD-10-CM | POA: Insufficient documentation

## 2022-03-19 DIAGNOSIS — Z79899 Other long term (current) drug therapy: Secondary | ICD-10-CM | POA: Diagnosis not present

## 2022-03-19 DIAGNOSIS — E860 Dehydration: Secondary | ICD-10-CM

## 2022-03-19 DIAGNOSIS — I2510 Atherosclerotic heart disease of native coronary artery without angina pectoris: Secondary | ICD-10-CM | POA: Diagnosis not present

## 2022-03-19 DIAGNOSIS — Z7982 Long term (current) use of aspirin: Secondary | ICD-10-CM | POA: Diagnosis not present

## 2022-03-19 DIAGNOSIS — W19XXXA Unspecified fall, initial encounter: Secondary | ICD-10-CM | POA: Diagnosis not present

## 2022-03-19 DIAGNOSIS — I10 Essential (primary) hypertension: Secondary | ICD-10-CM | POA: Insufficient documentation

## 2022-03-19 DIAGNOSIS — Z743 Need for continuous supervision: Secondary | ICD-10-CM | POA: Diagnosis not present

## 2022-03-19 DIAGNOSIS — I4510 Unspecified right bundle-branch block: Secondary | ICD-10-CM | POA: Diagnosis not present

## 2022-03-19 DIAGNOSIS — R590 Localized enlarged lymph nodes: Secondary | ICD-10-CM | POA: Diagnosis not present

## 2022-03-19 DIAGNOSIS — I639 Cerebral infarction, unspecified: Secondary | ICD-10-CM | POA: Diagnosis not present

## 2022-03-19 DIAGNOSIS — G319 Degenerative disease of nervous system, unspecified: Secondary | ICD-10-CM | POA: Diagnosis not present

## 2022-03-19 DIAGNOSIS — R0689 Other abnormalities of breathing: Secondary | ICD-10-CM | POA: Diagnosis not present

## 2022-03-19 LAB — COMPREHENSIVE METABOLIC PANEL
ALT: 66 U/L — ABNORMAL HIGH (ref 0–44)
AST: 77 U/L — ABNORMAL HIGH (ref 15–41)
Albumin: 2.7 g/dL — ABNORMAL LOW (ref 3.5–5.0)
Alkaline Phosphatase: 766 U/L — ABNORMAL HIGH (ref 38–126)
Anion gap: 8 (ref 5–15)
BUN: 17 mg/dL (ref 8–23)
CO2: 22 mmol/L (ref 22–32)
Calcium: 8.7 mg/dL — ABNORMAL LOW (ref 8.9–10.3)
Chloride: 111 mmol/L (ref 98–111)
Creatinine, Ser: 0.99 mg/dL (ref 0.61–1.24)
GFR, Estimated: >60 mL/min (ref >60)
Glucose, Bld: 94 mg/dL (ref 70–99)
Potassium: 3.6 mmol/L (ref 3.5–5.1)
Sodium: 141 mmol/L (ref 135–145)
Total Bilirubin: 1.3 mg/dL — ABNORMAL HIGH (ref 0.3–1.2)
Total Protein: 6 g/dL — ABNORMAL LOW (ref 6.5–8.1)

## 2022-03-19 LAB — CBC WITH DIFFERENTIAL/PLATELET
Band Neutrophils: 0 %
Basophils Absolute: 0 10*3/uL (ref 0.0–0.1)
Basophils Relative: 0 %
Eosinophils Absolute: 0.6 10*3/uL — ABNORMAL HIGH (ref 0.0–0.5)
Eosinophils Relative: 4 %
HCT: 32.3 % — ABNORMAL LOW (ref 39.0–52.0)
Hemoglobin: 10 g/dL — ABNORMAL LOW (ref 13.0–17.0)
Lymphocytes Relative: 53 %
Lymphs Abs: 7.6 10*3/uL — ABNORMAL HIGH (ref 0.7–4.0)
MCH: 29.2 pg (ref 26.0–34.0)
MCHC: 31 g/dL (ref 30.0–36.0)
MCV: 94.4 fL (ref 80.0–100.0)
Monocytes Absolute: 0.7 10*3/uL (ref 0.1–1.0)
Monocytes Relative: 5 %
Neutro Abs: 5.3 10*3/uL (ref 1.7–7.7)
Neutrophils Relative %: 37 %
Platelets: 184 10*3/uL (ref 150–400)
RBC: 3.42 MIL/uL — ABNORMAL LOW (ref 4.22–5.81)
RDW: 15.6 % — ABNORMAL HIGH (ref 11.5–15.5)
Smear Review: ADEQUATE
WBC: 14.4 10*3/uL — ABNORMAL HIGH (ref 4.0–10.5)
nRBC: 0 % (ref 0.0–0.2)
nRBC: 0 /100 WBC

## 2022-03-19 LAB — CBG MONITORING, ED: Glucose-Capillary: 76 mg/dL (ref 70–99)

## 2022-03-19 MED ORDER — SODIUM CHLORIDE 0.9 % IV SOLN: INTRAVENOUS | Status: DC

## 2022-03-19 NOTE — ED Provider Notes (Signed)
?[facility] ?Provider Note ? ? ?CSN: [id number] ?Arrival date & time: 03/19/22  1359 ? ?  ? ?History ? ?Chief Complaint  ?Patient presents with  ? Loss of Consciousness  ? ? ?Daniel Osborne is a 76 y.o. male. ? ?HPI ?Patient presents via EMS after episode of syncope. ?Patient states that he felt lightheaded, nauseous, and fell to the ground.  He was at a grocery store.  Patient notes multiple medical issues, but no history of prior syncope. ?Patient has a history of carotid disease, and believes that he is on a blood thinning medication. ?Currently pain only in the back of his head, no neck pain, hip pain. ?  ? ?Home Medications ?Prior to Admission medications   ?Medication Sig Start Date End Date Taking? Authorizing Provider  ?acetaminophen (TYLENOL) 500 MG tablet Take 500 mg by mouth every 6 (six) hours as needed.    [provider]  ?aspirin 81 MG chewable tablet Chew 1 tablet (81 mg total) by mouth daily. 01/14/22   Jacqualine Mau, NP  ?atorvastatin (LIPITOR) 80 MG tablet Take 1 tablet (80 mg total) by mouth daily. 01/14/22   Shelly Coss, MD  ?Calcium Carbonate Antacid (ALKA-SELTZER ANTACID PO) Take 2 tablets by mouth daily as needed. Mix with water    [provider]  ?midodrine (PROAMATINE) 5 MG tablet Take 1 tablet (5 mg total) by mouth 3 (three) times daily with meals. 01/18/22   Barton Dubois, MD  ?Multiple Vitamin (MULTIVITAMIN) tablet Take 1 tablet by mouth daily.    [provider]  ?psyllium (METAMUCIL) 58.6 % packet Take 1 packet by mouth daily.    [provider]  ?ticagrelor (BRILINTA) 90 MG TABS tablet Take 1 tablet (90 mg total) by mouth 2 (two) times daily. 01/13/22   Jacqualine Mau, NP  ?Vitamin D3 (VITAMIN D) 25 MCG tablet Take 2,000 Units by mouth daily.    [provider]  ?   ? ?Allergies    ?Patient has no known allergies.   ? ?Review of Systems   ?Review of Systems  ?Constitutional:   ?      Per HPI, otherwise negative  ?HENT:    ?     Per HPI, otherwise negative  ?Respiratory:    ?     Per HPI, otherwise negative  ?Cardiovascular:   ?     Per HPI, otherwise negative  ?Gastrointestinal:  Negative for vomiting.  ?Endocrine:  ?     Negative aside from HPI  ?Genitourinary:   ?     Neg aside from HPI   ?Musculoskeletal:   ?     Per HPI, otherwise negative  ?Skin: Negative.   ?Neurological:  Positive for syncope.  ? ?Physical Exam ?Updated Vital Signs ?BP (!) 142/76 (BP Location: Left Arm)   Pulse (!) 110   Temp 98.7 ?F (37.1 ?C) (Oral)   Resp 16   SpO2 99%  ?Physical Exam ?Vitals and nursing note reviewed.  ?Constitutional:   ?   General: He is not in acute distress. ?   Appearance: He is well-developed.  ?HENT:  ?   Head: Normocephalic and atraumatic.  ? ?Eyes:  ?   Conjunctiva/sclera: Conjunctivae normal.  ?Cardiovascular:  ?   Rate and Rhythm: Normal rate and regular rhythm.  ?Pulmonary:  ?   Effort: Pulmonary effort is normal. No respiratory distress.  ?   Breath sounds: No stridor.  ?Abdominal:  ?   General: There is  no distension.  ?Musculoskeletal:  ?   Cervical back: Neck supple. No tenderness.  ?Skin: ?   General: Skin is warm and dry.  ?Neurological:  ?   Mental Status: He is alert and oriented to person, place, and time.  ? ? ?ED Results / Procedures / Treatments   ?Labs ?(all labs ordered are listed, but only abnormal results are displayed) ?Labs Reviewed  ?CBC WITH DIFFERENTIAL/PLATELET - Abnormal; Notable for the following components:  ?    Result Value  ? WBC 14.4 (*)   ? RBC 3.42 (*)   ? Hemoglobin 10.0 (*)   ? HCT 32.3 (*)   ? RDW 15.6 (*)   ? All other components within normal limits  ?COMPREHENSIVE METABOLIC PANEL  ?CBG MONITORING, ED  ? ? ?EKG ?EKG Interpretation ? 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 ?Radiology ?DG Chest Port 1 View ? ?Result Date: 03/19/2022 ?CLINICAL DATA:  Syncope EXAM: PORTABLE CHEST 1 VIEW COMPARISON:  None. FINDINGS: Cardiomediastinal silhouette is within normal limits. Low lung volumes. No focal airspace consolidation. No large pleural effusion. No visible pneumothorax. There is no acute osseous abnormality. IMPRESSION: Low lung volumes.  No focal airspace consolidation. Electronically Signed   By: Maurine Simmering M.D.   On: 03/19/2022 14:49   ? ?Procedures ?Procedures  ? ? ?Medications Ordered in ED ?Medications  ?0.9 %  sodium chloride infusion ( Intravenous New Bag/Given 03/19/22 1445)  ? ? ?ED Course/ Medical Decision Making/ A&P ?This patient with a Hx of multiple medical issues including hypertension, carotid disease presents to the ED for concern of syncope, head pain, this involves an extensive number of treatment options, and is a complaint that carries with it a high risk of complications and morbidity.   ? ?The differential diagnosis includes cardiogenic, vasogenic, medication induced syncope with consideration of post syncope trauma. ? ? ?Social Determinants of Health: ? ?Age ? ?Additional history obtained: ? ?Additional history and/or information obtained from chart review, including discharge summary from 1 month ago after the patient was admitted following stroke.  He has noted history of CLL as well. ? ? ?After the initial evaluation, orders, including: Labs CT x-ray were initiated. ? ? ?Patient placed on Cardiac and Pulse-Oximetry Monitors. ?The patient was maintained on a cardiac monitor.  The cardiac monitored showed an rhythm of 105 sinus tach abnormal ?The patient was also maintained on pulse oximetry. The readings were typically 99% room air normal ? ? ?On repeat evaluation of the patient improved ?He has been upright, using a bedside urinal. ? ?7:05 PM ?Patient in no distress, hemodynamically  unremarkable.  He was joined a while ago by family members, and we had multiple conversations patient's presentation. ?Lab Tests: ? ?I personally interpreted labs.  The pertinent results include: Labs consistent with known leukemia ? ?Imaging Studies ordered: ? ?I independently visualized and interpreted imaging which showed no intracranial injury, no fracture of skull, neck ?I agree with the radiologist interpretation ? ? ?Dispostion / Final MDM: ? ?After consideration of the diagnostic results and the patient's response to treatment, he will follow-up with the [facility].  This adult male was presenting after episode of syncope and fall.  No evidence for posttraumatic injuries, some suspicion for vagal versus  dehydration etiology given the otherwise reassuring findings, denial of pain either before or afterwards and the description of a prodrome prior to losing consciousness. ?We discussed hospitalization given the lack of a specific etiology identified, family is comfortable with close outpatient follow-up, however, and this is reasonable given the patient's monitoring for hours without decompensation here in the emergency department. ? ? ?Final Clinical Impression(s) / ED Diagnoses ?Final diagnoses:  ?Syncope and collapse  ?Dehydration  ? ?  ?Carmin Muskrat, MD ?03/19/22 1906 ? ?

## 2022-03-19 NOTE — ED Notes (Signed)
Bedding changed d/t unable to hold urine. Gown change. Pt also given urinal. New blankets and pillow given to pt ?

## 2022-03-19 NOTE — Discharge Instructions (Signed)
As discussed, your evaluation today has been largely reassuring.  But, it is important that you monitor your condition carefully, and do not hesitate to return to the ED if you develop new, or concerning changes in your condition. ? ?Otherwise, please follow-up with your physician for appropriate ongoing care. ? ?

## 2022-03-19 NOTE — ED Triage Notes (Addendum)
Was at [employer]. Had syncopal episode. States he remembers "going down" but couldn't do anything about it. Lost his bladder. No hx of previous syncope. Pt states he hit his head. On blood thinners. Pt states he started to feel sick on his stomach before this episode ? ?Hx: HTN ?Complete block in carotid right side. And stent in left.  ?

## 2022-03-20 ENCOUNTER — Emergency Department (HOSPITAL_COMMUNITY)
Admission: EM | Admit: 2022-03-20 | Discharge: 2022-03-20 | Disposition: A | Discharge status: Home / Self Care | Payer: Medicare Other | Source: Home / Self Care

## 2022-03-20 ENCOUNTER — Other Ambulatory Visit: Payer: Self-pay

## 2022-03-20 ENCOUNTER — Encounter (HOSPITAL_COMMUNITY): Payer: Self-pay

## 2022-03-20 ENCOUNTER — Emergency Department (HOSPITAL_COMMUNITY)
Admission: EM | Admit: 2022-03-20 | Discharge: 2022-03-21 | Discharge status: Against Medical Advice | Payer: Medicare Other | Attending: Emergency Medicine | Admitting: Emergency Medicine

## 2022-03-20 DIAGNOSIS — R531 Weakness: Secondary | ICD-10-CM | POA: Diagnosis not present

## 2022-03-20 DIAGNOSIS — Z5321 Procedure and treatment not carried out due to patient leaving prior to being seen by health care provider: Secondary | ICD-10-CM | POA: Diagnosis not present

## 2022-03-20 DIAGNOSIS — R310 Gross hematuria: Secondary | ICD-10-CM | POA: Diagnosis not present

## 2022-03-20 DIAGNOSIS — R319 Hematuria, unspecified: Secondary | ICD-10-CM | POA: Diagnosis not present

## 2022-03-20 DIAGNOSIS — N3289 Other specified disorders of bladder: Secondary | ICD-10-CM | POA: Diagnosis not present

## 2022-03-20 LAB — CBC WITH DIFFERENTIAL/PLATELET
Basophils Absolute: 0 10*3/uL (ref 0.0–0.1)
Basophils Relative: 0 %
Eosinophils Absolute: 1.1 10*3/uL — ABNORMAL HIGH (ref 0.0–0.5)
Eosinophils Relative: 6 %
HCT: 33.7 % — ABNORMAL LOW (ref 39.0–52.0)
Hemoglobin: 10.7 g/dL — ABNORMAL LOW (ref 13.0–17.0)
Lymphocytes Relative: 58 %
Lymphs Abs: 10.3 10*3/uL — ABNORMAL HIGH (ref 0.7–4.0)
MCH: 29.6 pg (ref 26.0–34.0)
MCHC: 31.8 g/dL (ref 30.0–36.0)
MCV: 93.1 fL (ref 80.0–100.0)
Monocytes Absolute: 1.6 10*3/uL — ABNORMAL HIGH (ref 0.1–1.0)
Monocytes Relative: 9 %
Neutro Abs: 4.8 10*3/uL (ref 1.7–7.7)
Neutrophils Relative %: 27 %
Platelets: 215 10*3/uL (ref 150–400)
RBC: 3.62 MIL/uL — ABNORMAL LOW (ref 4.22–5.81)
RDW: 15.6 % — ABNORMAL HIGH (ref 11.5–15.5)
WBC: 17.7 10*3/uL — ABNORMAL HIGH (ref 4.0–10.5)
nRBC: 0 % (ref 0.0–0.2)

## 2022-03-20 LAB — URINALYSIS, ROUTINE W REFLEX MICROSCOPIC
Bilirubin Urine: NEGATIVE
Glucose, UA: NEGATIVE mg/dL
Ketones, ur: NEGATIVE mg/dL
Leukocytes,Ua: NEGATIVE
Nitrite: NEGATIVE
Protein, ur: 30 mg/dL — AB
RBC / HPF: >50 RBC/hpf — ABNORMAL HIGH (ref 0–5)
Specific Gravity, Urine: 1.014 (ref 1.005–1.030)
WBC, UA: >50 WBC/hpf — ABNORMAL HIGH (ref 0–5)
pH: 7 (ref 5.0–8.0)

## 2022-03-20 LAB — COMPREHENSIVE METABOLIC PANEL
ALT: 81 U/L — ABNORMAL HIGH (ref 0–44)
AST: 102 U/L — ABNORMAL HIGH (ref 15–41)
Albumin: 3.2 g/dL — ABNORMAL LOW (ref 3.5–5.0)
Alkaline Phosphatase: 878 U/L — ABNORMAL HIGH (ref 38–126)
Anion gap: 6 (ref 5–15)
BUN: 18 mg/dL (ref 8–23)
CO2: 22 mmol/L (ref 22–32)
Calcium: 9.3 mg/dL (ref 8.9–10.3)
Chloride: 109 mmol/L (ref 98–111)
Creatinine, Ser: 0.97 mg/dL (ref 0.61–1.24)
GFR, Estimated: >60 mL/min (ref >60)
Glucose, Bld: 122 mg/dL — ABNORMAL HIGH (ref 70–99)
Potassium: 4.1 mmol/L (ref 3.5–5.1)
Sodium: 137 mmol/L (ref 135–145)
Total Bilirubin: 1.3 mg/dL — ABNORMAL HIGH (ref 0.3–1.2)
Total Protein: 7.5 g/dL (ref 6.5–8.1)

## 2022-03-20 NOTE — ED Triage Notes (Signed)
Bloody urine since today. Denies any pain. States it is bright red.  ?

## 2022-03-21 LAB — PATHOLOGIST SMEAR REVIEW

## 2022-05-15 ENCOUNTER — Other Ambulatory Visit (HOSPITAL_COMMUNITY): Payer: Self-pay | Admitting: Neuroradiology

## 2022-05-15 DIAGNOSIS — I639 Cerebral infarction, unspecified: Secondary | ICD-10-CM

## 2022-06-02 ENCOUNTER — Ambulatory Visit (HOSPITAL_BASED_OUTPATIENT_CLINIC_OR_DEPARTMENT_OTHER)
Admission: RE | Admit: 2022-06-02 | Discharge: 2022-06-02 | Disposition: A | Discharge status: Home / Self Care | Payer: Medicare Other | Source: Ambulatory Visit | Attending: Neuroradiology | Admitting: Neuroradiology

## 2022-06-02 ENCOUNTER — Ambulatory Visit (HOSPITAL_COMMUNITY)
Admission: RE | Admit: 2022-06-02 | Discharge: 2022-06-02 | Disposition: A | Discharge status: Home / Self Care | Payer: Medicare Other | Source: Ambulatory Visit | Attending: Radiology | Admitting: Radiology

## 2022-06-02 DIAGNOSIS — I10 Essential (primary) hypertension: Secondary | ICD-10-CM | POA: Diagnosis not present

## 2022-06-02 DIAGNOSIS — I6522 Occlusion and stenosis of left carotid artery: Secondary | ICD-10-CM | POA: Insufficient documentation

## 2022-06-02 DIAGNOSIS — I639 Cerebral infarction, unspecified: Secondary | ICD-10-CM

## 2022-06-02 DIAGNOSIS — C9110 Chronic lymphocytic leukemia of B-cell type not having achieved remission: Secondary | ICD-10-CM | POA: Insufficient documentation

## 2022-06-02 NOTE — Progress Notes (Signed)
Carotid artery duplex has been completed. Preliminary results can be found in CV Proc through chart review.   06/02/22 10:13 AM Daniel Osborne RVT

## 2022-06-05 ENCOUNTER — Other Ambulatory Visit (HOSPITAL_COMMUNITY): Payer: Self-pay | Admitting: Neuroradiology

## 2022-06-05 DIAGNOSIS — I63231 Cerebral infarction due to unspecified occlusion or stenosis of right carotid arteries: Secondary | ICD-10-CM

## 2022-06-05 NOTE — Consult Note (Signed)
Referring Physician(s): Omohundro,Jennifer C  Chief Complaint: The patient is seen in follow up today s/p left carotid stenting.  History of present illness:  Daniel Osborne is a 76 year old male with a past medical history significant for hypertension and chronic leukemia who presented to the [facility] ED on 01/09/2022 with new onset left sided facial droop, drooling and pocketing of food in the left side of his mouth while eating. CT angiogram of the head and neck showed right ICA occlusion with intracranial reconstitution and left ICA severe stenosis. MRI of the brain was positive for small right MCA territory infarcts. He underwent a diagnostic cerebral angiogram on 01/12/2022 that confirmed right ICA chronic occlusion and showed 65% stenosis of the cervical left ICA. Stenting of the left ICA was performed in the same session.  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  He has made a great recovery and complains today only of mild left leg weakness which affects hils balance and says that thin liquid sometimes spills from the left side of his mouth.   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  Medications: Prior to  Admission medications   Medication Sig Start Date End Date Taking? Authorizing Provider  acetaminophen (TYLENOL) 500 MG tablet Take 500 mg by mouth every 6 (six) hours as needed.    [provider]  aspirin 81 MG chewable tablet Chew 1 tablet (81 mg total) by mouth daily. 01/14/22   Jacqualine Mau, NP  atorvastatin (LIPITOR) 80 MG tablet Take 1 tablet (80 mg total) by mouth daily. 01/14/22   Shelly Coss, MD  Calcium Carbonate Antacid (ALKA-SELTZER ANTACID PO) Take 2 tablets by mouth daily as needed. Mix with water    [provider]  midodrine (PROAMATINE) 5 MG tablet Take 1 tablet (5 mg total) by mouth 3 (three) times daily with meals. 01/18/22   Barton Dubois, MD  Multiple Vitamin (MULTIVITAMIN) tablet Take 1 tablet by mouth daily.    [provider]  psyllium (METAMUCIL) 58.6 % packet Take 1 packet by mouth daily.    [provider]  ticagrelor (BRILINTA) 90 MG TABS tablet Take 1 tablet (90 mg total) by mouth 2 (two) times daily. 01/13/22   Jacqualine Mau, NP  Vitamin D3 (VITAMIN D) 25 MCG tablet Take 2,000 Units by mouth daily.    [provider]     Family History  Problem Relation Age of Onset   Stroke Mother    Cancer Father        leukemia   Cancer Sister        breast    Social History   Socioeconomic History   Marital status:  Married    Spouse name: Not on file   Number of children: Not on file   Years of education: Not on file   Highest education level: Not on file  Occupational History   Not on file  Tobacco Use   Smoking status: Never   Smokeless tobacco: Never  Vaping Use   Vaping Use: Never used  Substance and Sexual Activity   Alcohol use: No   Drug use: No   Sexual activity: Not on file  Other Topics Concern   Not on file  Social History Narrative   Not on file   Social Determinants of Health   Financial Resource Strain: Not on file  Food Insecurity: Not on file  Transportation Needs: Not on  file  Physical Activity: Not on file  Stress: Not on file  Social Connections: Not on file     Vital Signs: There were no vitals taken for this visit.  Physical Exam Constitutional:      Appearance: Normal appearance.  HENT:     Head: Normocephalic and atraumatic.     Mouth/Throat:     Mouth: Mucous membranes are moist.     Pharynx: Oropharynx is clear.  Eyes:     Extraocular Movements: Extraocular movements intact.     Conjunctiva/sclera: Conjunctivae normal.     Pupils: Pupils are equal, round, and reactive to light.  Neurological:     Mental Status: He is alert and oriented to person, place, and time.     Cranial Nerves: Facial asymmetry present.     Sensory: Sensation is intact.     Motor: Weakness present.     Comments: Mild left upper and lower extremity weakness. Mild left facial droop.     Imaging: VAS US CAROTID  Result Date: 06/03/2022 Carotid Arterial Duplex Study Patient Name:  Daniel Osborne  Date of Exam:   06/02/2022 Medical Rec #: [id number]        Accession #:    [id number] Date of Birth: 1946/10/13        Patient Gender: M Patient Age:   58 years Exam Location:  [facility] Procedure:      VAS US CAROTID Referring Phys: Erven Colla DE MACEDO RODRIGUES --------------------------------------------------------------------------------  Indications:       CVA. Risk Factors:      Hypertension, hyperlipidemia. Comparison Study:  01/09/2022 - CT head:                     1. Patchy hypoattenuation in the right frontal white matter                    and                    bilateral basal ganglia, which could represent chronic                    microvascular                    ischemic disease versus age indeterminate infarcts in the                    absence of                    priors. MRI could provide more sensitive evaluation for acute                    infarct.  2. No evidence of acute hemorrhage.                     CTA:                     1.  Right ICA origin occlusion in the neck with non                    opacification of                    the remainder of the neck and reconstitution at the level of                    the                    proximal petrous ICA.                    2. Moderate to severe bilateral paraclinoid ICA stenosis.                    3. Multifocal severe left P1 and P2 PCA stenosis. Moderate                    right P1                    PCA stenosis.                    4. Severe right vertebral artery origin stenosis. Also,                    severe right                    and moderate left distal intradural vertebral artery                    stenosis.                    5. Approximately 70-80% stenosis of the proximal left ICA in                    the                    neck.                    6. Multifocal mild-to-moderate bilateral M1 and M2 MCA                    stenosis                    7. Increased number of lymph nodes in the neck bilaterally                    and in                    the visualized upper mediastinum with enlarged and somewhat                    rounded                    submandibular and upper cervical chain nodes. While  nonspecific,                    findings raise concern for underlying lymphoproliferative                    disorder.                    Consider follow-up CT neck and possibly CT                    chest/abdomen/pelvis for                    further evaluation.                     01/15/2022 - CTA HEAD AND NECK:                     1. No acute large vessel occlusion.                    2. Unchanged occlusion of the proximal right ICA with                    intracranial                    reconstitution.                    3. Patent left carotid bifurcation stent.                    4. Unchanged advanced intracranial atherosclerosis as above.                    5. 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                    6. Mildly  decreased cervical lymphadenopathy.                    7. Aortic Atherosclerosis (ICD10-I70.0).                     CT BRAIN PERFUSION:                     Small core infarct in the high right frontal lobe with                    delayed                    perfusion throughout the right MCA territory as above. Performing Technologist: Oliver Hum RVT  Examination Guidelines: A complete evaluation includes B-mode imaging, spectral Doppler, color Doppler, and power Doppler as needed of all accessible portions of each vessel. Bilateral testing is considered an integral part of a complete examination. Limited examinations for reoccurring indications may be performed as noted.  Right Carotid Findings: +----------+--------+-------+--------+----------------------+------------------+           PSV cm/sEDV    StenosisPlaque Description    Comments                             cm/s                                                    +----------+--------+-------+--------+----------------------+------------------+  CCA Prox  95      11             smooth and                                                                heterogenous                             +----------+--------+-------+--------+----------------------+------------------+ CCA Distal59      11             smooth and                                                                heterogenous                             +----------+--------+-------+--------+----------------------+------------------+ ICA Prox  101     18             heterogenous          Abnormal waveforms +----------+--------+-------+--------+----------------------+------------------+ ICA Mid   108     23                                   Abnormal waveforms +----------+--------+-------+--------+----------------------+------------------+ ICA Distal83      17                                   tortuous            +----------+--------+-------+--------+----------------------+------------------+ ECA       154     26                                                      +----------+--------+-------+--------+----------------------+------------------+ +----------+--------+-------+--------+-------------------+           PSV cm/sEDV cmsDescribeArm Pressure (mmHG) +----------+--------+-------+--------+-------------------+ Subclavian114                                        +----------+--------+-------+--------+-------------------+ +---------+--------+--+--------+--+---------+ VertebralPSV cm/s56EDV cm/s21Antegrade +---------+--------+--+--------+--+---------+  Left Carotid Findings: +----------+--------+--------+--------+-----------------------+--------+           PSV cm/sEDV cm/sStenosisPlaque Description     Comments +----------+--------+--------+--------+-----------------------+--------+ CCA Prox  126     27              smooth and heterogenous         +----------+--------+--------+--------+-----------------------+--------+ CCA Distal76      24              smooth and heterogenous         +----------+--------+--------+--------+-----------------------+--------+ ICA Prox  101     32  Stent    +----------+--------+--------+--------+-----------------------+--------+ ICA Mid   107     38                                     Stent    +----------+--------+--------+--------+-----------------------+--------+ ICA Distal124     42                                     Stent    +----------+--------+--------+--------+-----------------------+--------+ ECA       152                                                     +----------+--------+--------+--------+-----------------------+--------+ +----------+--------+--------+--------+-------------------+           PSV cm/sEDV cm/sDescribeArm Pressure (mmHG)  +----------+--------+--------+--------+-------------------+ [id number]                                         +----------+--------+--------+--------+-------------------+ +---------+--------+--+--------+--+---------+ VertebralPSV cm/s46EDV cm/s18Antegrade +---------+--------+--+--------+--+---------+  Left Stent(s): +--------------+---+--++++ Prox to Stent 88 24 +--------------+---+--++++ Proximal [id number] +--------------+---+--++++ Mid Stent     10738 +--------------+---+--++++ Distal Stent  12442 +--------------+---+--++++    Summary: Right Carotid: Velocities in the right ICA are consistent with a 1-39% stenosis.                Patent appearing ICA with abnormal waveforms. Left Carotid: Less than 50% stenosis in the presence of a stent. Vertebrals: Bilateral vertebral arteries demonstrate antegrade flow. *See table(s) above for measurements and observations.  Electronically signed by Jamelle Haring on 06/03/2022 at 9:41:09 AM.    Final     Labs:  CBC: Recent Labs    01/16/22 0824 01/18/22 0425 03/19/22 1405 03/20/22 1846  WBC 30.9* 26.4* 14.4* 17.7*  HGB 11.4* 12.2* 10.0* 10.7*  HCT 37.2* 40.3 32.3* 33.7*  PLT 189 164 184 215    COAGS: Recent Labs    01/09/22 1620 01/15/22 1722  INR 1.0 1.0  APTT  --  24    BMP: Recent Labs    01/17/22 0439 01/18/22 0425 03/19/22 1405 03/20/22 1846  NA 141 143 141 137  K 3.6 3.9 3.6 4.1  CL 115* 115* 111 109  CO2 17* 19* 22 22  GLUCOSE 94 86 94 122*  BUN '16 16 17 18  '$ CALCIUM 8.3* 9.0 8.7* 9.3  CREATININE 0.99 1.14 0.99 0.97  GFRNONAA >60 >60 >60 >60    LIVER FUNCTION TESTS: Recent Labs    01/10/22 0410 01/15/22 1612 03/19/22 1405 03/20/22 1846  BILITOT 0.4 0.2* 1.3* 1.3*  AST 17 25 77* 102*  ALT 13 24 66* 81*  ALKPHOS 60 61 766* 878*  PROT 6.5 6.3* 6.0* 7.5  ALBUMIN 3.5 3.5 2.7* 3.2*    Assessment:  Mr. Pheasant has made a great recovery since he was discharged home. No  complication from his left carotid stenting are observed. He underwent a carotid duplex to evaluate his left ICA stent. The stent is widely patent. Surprisingly, the ultrasound showed a patent right ICA in the neck with minimal stenosis. I would like to have him undergo a CT angiogram of the head and neck  to confirm ultrasound findings. In the meantime, I recommend continuing on ASA 81 mg qd and brilinta 90 mg bid. We will reassess the need for DAPT once results from CTA are back.  Signed: [name], MD 06/05/2022, 12:44 PM    I spent a total of    25 Minutes in face to face in clinical consultation, greater than 50% of which was counseling/coordinating care for carotid stenosis.

## 2022-07-07 ENCOUNTER — Ambulatory Visit
Admission: RE | Admit: 2022-07-07 | Discharge: 2022-07-07 | Disposition: A | Discharge status: Home / Self Care | Payer: Medicare Other | Source: Ambulatory Visit | Attending: Neuroradiology | Admitting: Neuroradiology

## 2022-07-07 DIAGNOSIS — I63231 Cerebral infarction due to unspecified occlusion or stenosis of right carotid arteries: Secondary | ICD-10-CM

## 2022-07-07 DIAGNOSIS — I6523 Occlusion and stenosis of bilateral carotid arteries: Secondary | ICD-10-CM | POA: Diagnosis not present

## 2022-07-07 DIAGNOSIS — Z8673 Personal history of transient ischemic attack (TIA), and cerebral infarction without residual deficits: Secondary | ICD-10-CM | POA: Diagnosis not present

## 2022-07-07 DIAGNOSIS — I6603 Occlusion and stenosis of bilateral middle cerebral arteries: Secondary | ICD-10-CM | POA: Diagnosis not present

## 2022-07-07 DIAGNOSIS — I672 Cerebral atherosclerosis: Secondary | ICD-10-CM | POA: Diagnosis not present

## 2022-07-07 MED ORDER — IOPAMIDOL (ISOVUE-370) INJECTION 76%
75.0000 mL | Freq: Once | INTRAVENOUS | Status: AC | PRN
  Administered 2022-07-07: 75 mL via INTRAVENOUS

## 2022-07-13 ENCOUNTER — Other Ambulatory Visit (HOSPITAL_COMMUNITY): Payer: Self-pay | Admitting: Neuroradiology

## 2022-07-13 DIAGNOSIS — I639 Cerebral infarction, unspecified: Secondary | ICD-10-CM

## 2022-07-19 ENCOUNTER — Ambulatory Visit (HOSPITAL_COMMUNITY): Payer: Medicare Other

## 2022-07-21 ENCOUNTER — Ambulatory Visit (HOSPITAL_COMMUNITY)
Admission: RE | Admit: 2022-07-21 | Discharge: 2022-07-21 | Disposition: A | Discharge status: Home / Self Care | Payer: Medicare Other | Source: Ambulatory Visit | Attending: Neuroradiology | Admitting: Neuroradiology

## 2022-07-21 DIAGNOSIS — I639 Cerebral infarction, unspecified: Secondary | ICD-10-CM

## 2022-07-21 NOTE — Progress Notes (Signed)
Referring Physician(s): de Macedo [place]  Chief Complaint: The patient is seen in follow up today s/p left carotid stenting.  History of present illness:  Daniel Osborne is a 76 year old male with a past medical history significant for hypertension and chronic leukemia who presented to the [facility] ED on 01/09/2022 with new onset left sided facial droop, drooling and pocketing of food in the left side of his mouth while eating. CT angiogram of the head and neck showed right ICA occlusion with intracranial reconstitution and left ICA severe stenosis. MRI of the brain was positive for small right MCA territory infarcts. He underwent a diagnostic cerebral angiogram on 01/12/2022 that confirmed right ICA chronic occlusion and showed 65% stenosis of the cervical left ICA. Stenting of the left ICA was performed in the same session.   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   He has made a great recovery and complains today only of mild left leg weakness which affects hils balance and says that thin liquid sometimes spills from the left side of his mouth.   He was last seen in office on 06/02/2022 when we reviewed results of his carotid duplex performed on the same day that showed patent left carotid stent and also a patent right ICA in the neck with minimal stenosis. This was surprising given complete carotid occlusion seen on cerebral angiography and CT angiogram in February 2023. We decided to proceed with a new CT angiogram to clarify this finding.   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  Medications: Prior to Admission medications   Medication Sig Start Date End Date Taking? Authorizing Provider  acetaminophen (TYLENOL) 500 MG tablet Take 500 mg by mouth every 6 (six) hours as needed.    [provider]  aspirin 81 MG chewable tablet Chew 1 tablet (81 mg total) by mouth daily. 01/14/22   Jacqualine Mau, NP  atorvastatin (LIPITOR) 80 MG tablet Take 1 tablet (80 mg total) by mouth daily. 01/14/22   Shelly Coss, MD  Calcium Carbonate Antacid (ALKA-SELTZER ANTACID PO) Take 2 tablets by mouth daily as needed. Mix with water    [provider]  midodrine (PROAMATINE) 5 MG tablet Take 1 tablet (5 mg total) by mouth 3 (three) times daily with meals. 01/18/22   Barton Dubois, MD  Multiple Vitamin (MULTIVITAMIN) tablet Take 1 tablet by mouth daily.    [provider]  psyllium (METAMUCIL) 58.6 % packet Take 1 packet by mouth daily.    [provider]  ticagrelor (BRILINTA) 90 MG TABS tablet Take 1 tablet (90 mg total) by mouth 2 (two) times daily. 01/13/22   Jacqualine Mau, NP  Vitamin D3 (VITAMIN D) 25 MCG tablet Take 2,000 Units by mouth daily.    [provider]     Family History  Problem Relation Age of Onset   Stroke Mother    Cancer Father        leukemia   Cancer Sister        breast    Social History   Socioeconomic History   Marital status: Married    Spouse name: Not on file   Number of children: Not on file   Years of education: Not on file   Highest education level: Not on file  Occupational History   Not on file   Tobacco Use   Smoking status: Never   Smokeless tobacco: Never  Vaping Use   Vaping Use: Never used  Substance and Sexual Activity   Alcohol use: No   Drug use: No   Sexual activity: Not on file  Other Topics Concern   Not on file  Social History Narrative   Not on file   Social Determinants of Health   Financial Resource Strain: Not on file  Food Insecurity: Not on file  Transportation Needs: Not on file  Physical Activity: Not on file  Stress: Not on file  Social Connections: Not on file     Vital Signs: There were no vitals taken for this visit.  Physical Exam Constitutional:      Appearance: Normal appearance.  HENT:     Head: Normocephalic and atraumatic.     Mouth/Throat:     Mouth: Mucous membranes are moist.     Pharynx: Oropharynx is clear.  Eyes:     Extraocular Movements: Extraocular movements intact.     Conjunctiva/sclera: Conjunctivae normal.     Pupils: Pupils are equal, round, and reactive to light.  Neurological:     Mental Status: He is alert and oriented to person, place, and time.     Cranial Nerves: Facial asymmetry present.     Sensory: Sensation is intact.     Motor: Weakness present.     Comments: Mild left upper and lower extremity weakness. Mild left facial droop.     Imaging: CT ANGIOGRAPHY HEAD AND NECK FINDINGS: CT HEAD FINDINGS Brain: Remote high right frontal parietal infarct which is cortically based. No acute or subacute infarct, hemorrhage, hydrocephalus, or collection Vascular: No hyperdense vessel or unexpected calcification. Skull: Normal. Negative for fracture or focal lesion. Sinuses/Orbits: Bilateral maxillary sinus atelectasis with complete opacification on the right.  Review of the MIP images confirms the above findings  CTA NECK FINDINGS Aortic arch: Atheromatous plaque with 3 vessel branching. Right carotid system: Diffuse atheromatous wall thickening the common carotid. Bulky plaque at the bifurcation with  short segment of no visible flow followed by string sign which continues to the level of the skull base where there is more robust reconstitution of the ICA. Left carotid system: Patent left carotid stent crossing the bifurcation. No new stenosis. Vertebral arteries: Proximal subclavian atherosclerosis with up to 30% narrowing on the left. Bilateral vertebral origin atherosclerosis and stenosis, severe on the right and 30% on the left. Skeleton: No acute or aggressive finding Other neck: Diffusely conspicuous nodes in the neck and upper chest, the largest measuring stable from prior (bilateral submandibular nodes measure up to 2.3 cm in length; a left supraclavicular fossa node measures 13 mm in length) Upper chest: Clear apical lungs Review of the MIP images confirms the above findings CTA HEAD FINDINGS Anterior circulation: Confluent atheromatous calcification  on the carotid siphons. 40% stenosis at the supraclinoid right ICA. Moderate atheromatous irregularity bilateral ICA branches with moderate bilateral M1 and M2 branch stenoses. No major branch occlusion or new stenosis. Negative for aneurysm Posterior circulation: High-grade bilateral V4 segment stenosis due to atheromatous plaque, 65% on the right and at least 60% on the left. Diffuse atheromatous irregularity of the basilar. Diffuse atheromatous irregularity of the posterior cerebral arteries with moderate and advanced left P2 segment narrowings. Mild-to-moderate right P1 segment stenosis. Negative for aneurysm Venous sinuses: Patent Anatomic variants: Hypoplastic right A1 segment. Review of the MIP images confirms the above findings IMPRESSION: 1. Chronic proximal right ICA occlusion. There is newly seen thready flow in the mid and distal cervical right ICA. More robust reconstitution at the level of the skull base, as on prior. 2. Unchanged patency of the left ICA stent. 3. Unchanged severe right vertebral origin  stenosis. 4. Unchanged widespread intracranial atherosclerosis most notably affecting the bilateral V4 segment and left proximal PCA. 5. 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     Electronically Signed   By: Jorje Guild M.D.   On: 07/07/2022 14:27  Labs:  CBC: Recent Labs    01/16/22 0824 01/18/22 0425 03/19/22 1405 03/20/22 1846  WBC 30.9* 26.4* 14.4* 17.7*  HGB 11.4* 12.2* 10.0* 10.7*  HCT 37.2* 40.3 32.3* 33.7*  PLT 189 164 184 215    COAGS: Recent Labs    01/09/22 1620 01/15/22 1722  INR 1.0 1.0  APTT  --  24    BMP: Recent Labs    01/17/22 0439 01/18/22 0425 03/19/22 1405 03/20/22 1846  NA 141 143 141 137  K 3.6 3.9 3.6 4.1  CL 115* 115* 111 109  CO2 17* 19* 22 22  GLUCOSE 94 86 94 122*  BUN '16 16 17 18  '$ CALCIUM 8.3* 9.0 8.7* 9.3  CREATININE 0.99 1.14 0.99 0.97  GFRNONAA >60 >60 >60 >60    LIVER FUNCTION TESTS: Recent Labs    01/10/22 0410 01/15/22 1612 03/19/22 1405 03/20/22 1846  BILITOT 0.4 0.2* 1.3* 1.3*  AST 17 25 77* 102*  ALT 13 24 66* 81*  ALKPHOS 60 61 766* 878*  PROT 6.5 6.3* 6.0* 7.5  ALBUMIN 3.5 3.5 2.7* 3.2*    Assessment:  Daniel Osborne is doing well without any new complaints.  Recent CT angiogram performed to clarify carotid duplex findings showed newly seen thready flow in the mid and distal cervical right ICA.  However, the right carotid remained occluded proximally at the bifurcation.  Patent vessel on carotid duplex on the right side was likely the right external carotid artery mistakenly labeled as right ICA.  Given that his left carotid stent is widely patent, we will discontinue the Brilinta.  Patient advised to remain on aspirin 81 mg once a day.  He may have his annual carotid duplex follow-up with his primary care provider.  In case follow-up ultrasound shows any narrowing of the left carotid stent, we would be happy to see him to evaluate for  any intervention need.   Signed: [name], MD 07/21/2022, 1:12 PM    I spent a total of    15 Minutes in face to face in clinical consultation, greater than 50% of which was counseling/coordinating care for carotid disease.

## 2023-01-23 IMAGING — CT CT HEAD W/O CM
4 series · 15 of 47 positions shown, 17 images · non-contrast
Comparison: CT head dated January 15, 2022.

CLINICAL DATA: Fall.



[Series 3: head wo · axial · 0.35mm/px · z∈[+130,+247]mm · 7 of 32 slices shown, 9 images]
[im 4/32  brain]
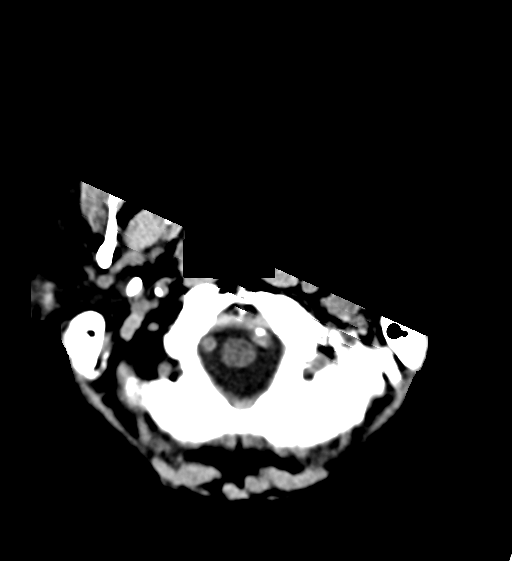
[im 4/32  bone]
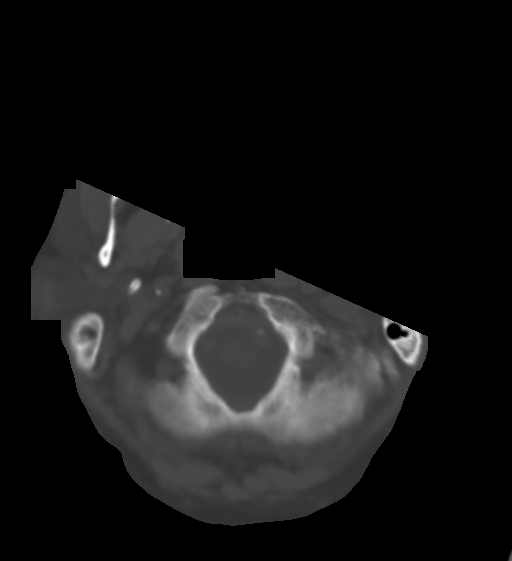
[im 8/32  brain]
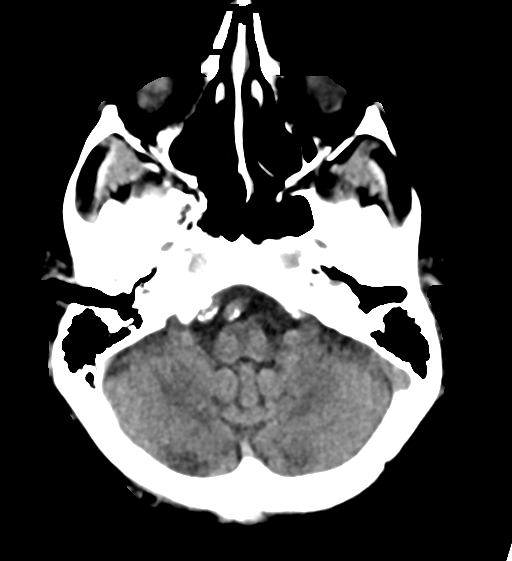
[im 12/32  brain]
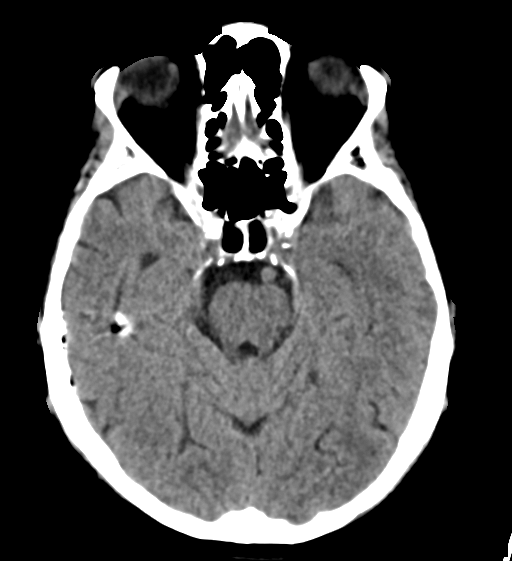
[im 16/32  brain]
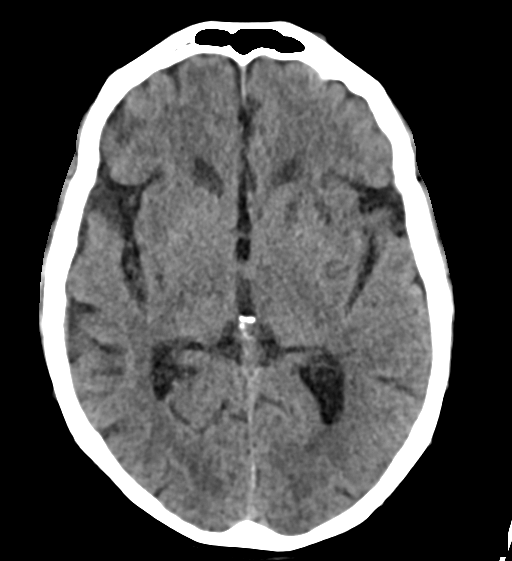
[im 20/32  brain]
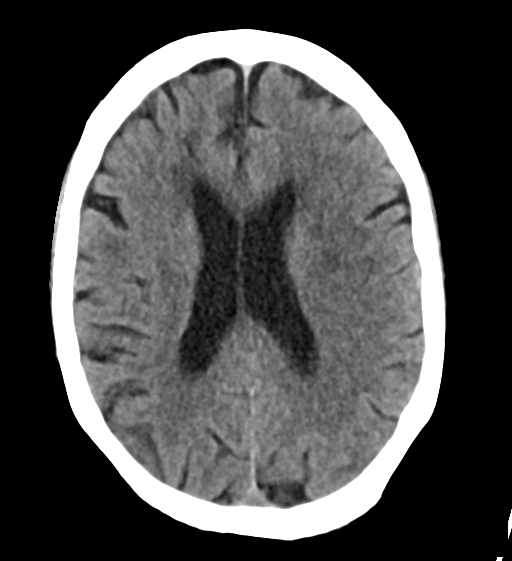
[im 20/32  bone]
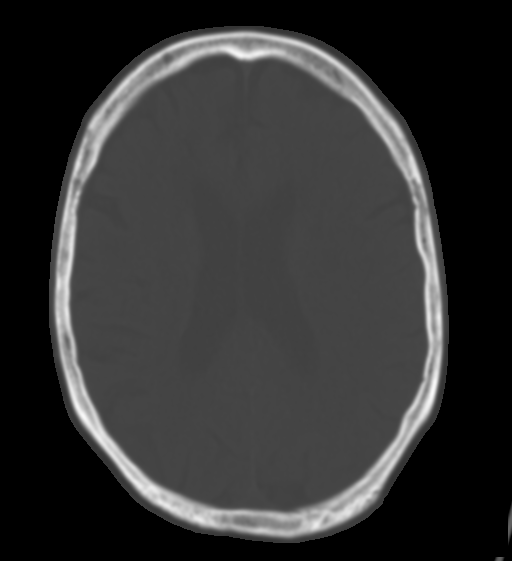
[im 24/32  brain]
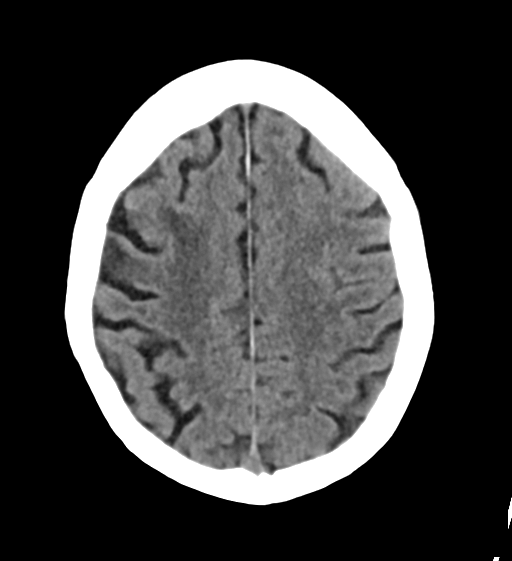
[im 28/32  brain]
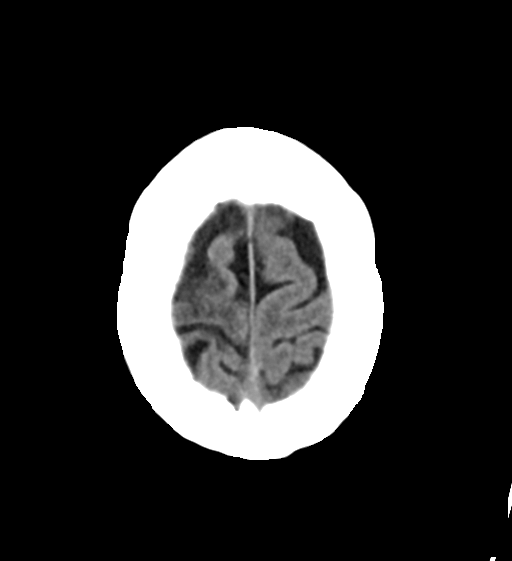

[Series 4: head bone · axial · 0.41mm/px · z∈[+156,+172]mm · 2 of 76 slices shown]
[im 8/76  bone]
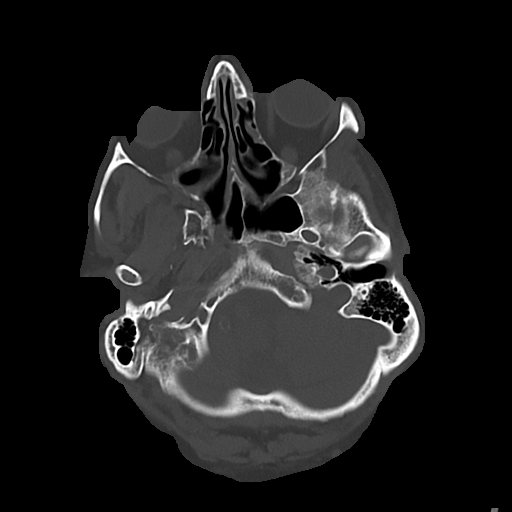
[im 16/76  bone]
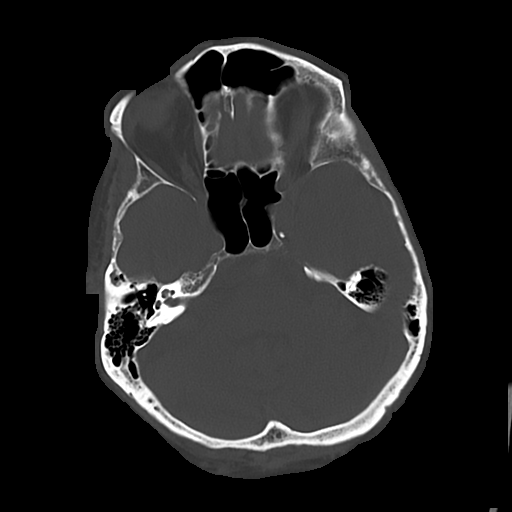

[Series 5: cor soft · coronal · 0.31mm/px · 3 of 66 slices shown]
[im 22/66  brain]
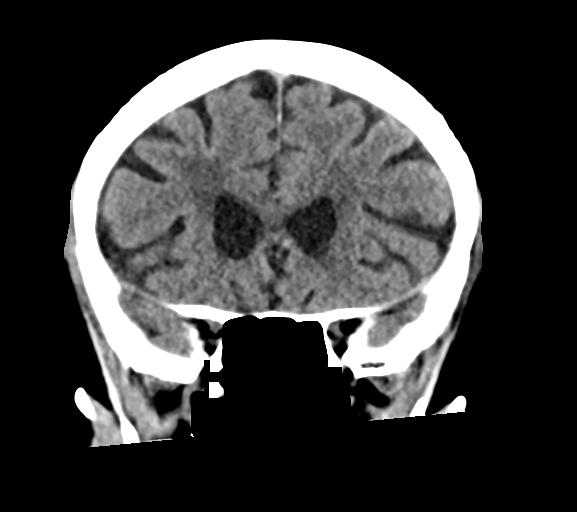
[im 29/66  brain]
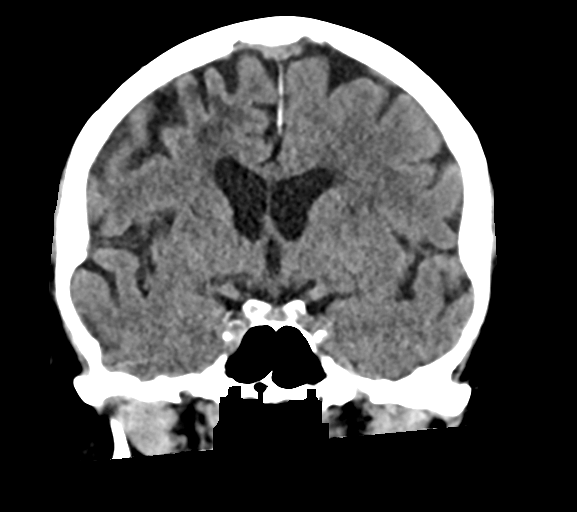
[im 37/66  brain]
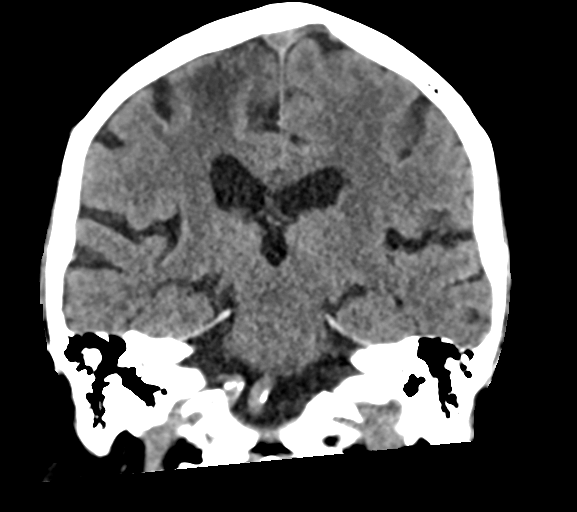

[Series 6: sag soft · sagittal · 0.33mm/px · 3 of 60 slices shown]
[im 20/60  brain]
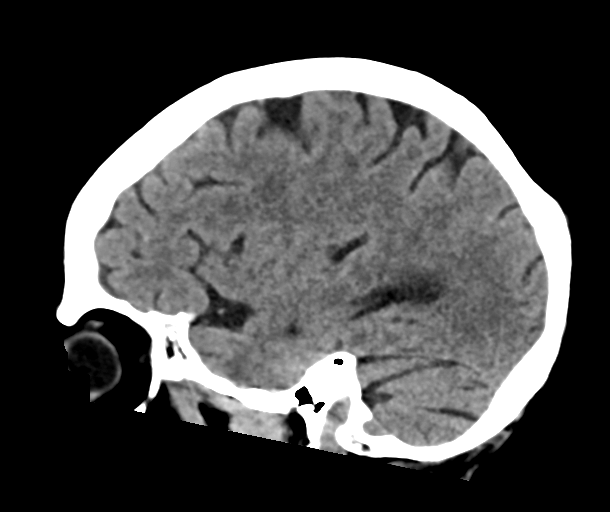
[im 30/60  brain]
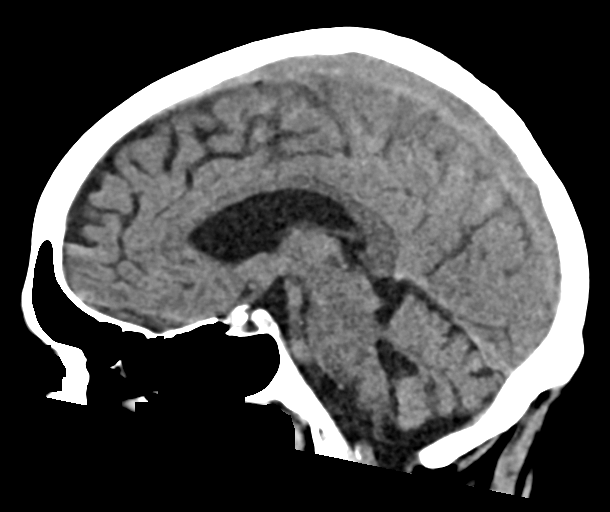
[im 40/60  brain]
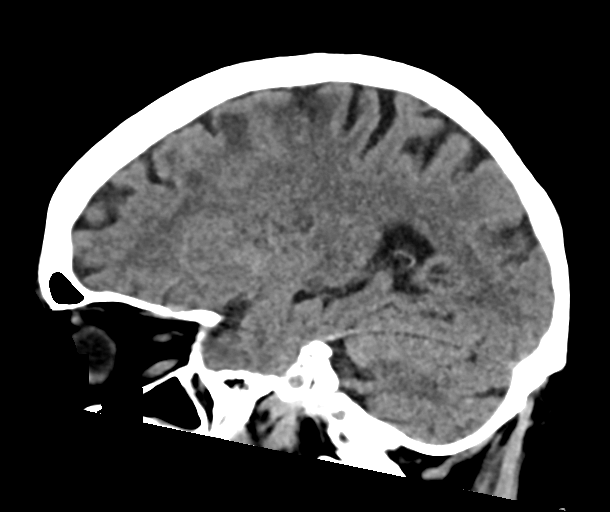

[15 of 47 positions shown; findings below may reference images not displayed]

FINDINGS: CT HEAD FINDINGS

Brain: Expected evolution of the now chronic right frontal lobe
infarct with new areas of linear hyperdensity at the vertex which
could reflect laminar necrosis or petechial hemorrhage (series 3,
image 7). No evidence of acute infarction, hydrocephalus,
extra-axial collection or mass lesion/mass effect. Stable atrophy
and chronic microvascular ischemic changes.

Vascular: Calcified atherosclerosis at the skull base. No hyperdense
vessel.

Skull: Normal. Negative for fracture or focal lesion.

Sinuses/Orbits: No acute finding.

Other: None.

CT CERVICAL SPINE FINDINGS

Alignment: Normal.

Skull base and vertebrae: No acute fracture. No primary bone lesion
or focal pathologic process.

Soft tissues and spinal canal: No prevertebral fluid or swelling. No
visible canal hematoma.

Disc levels: Mild disc height loss and uncovertebral hypertrophy at
C6-C7.

Upper chest: Negative.

Other: Multiple bilateral prominent subcentimeter and mildly
enlarged cervical lymph nodes likely reflect patient's history of
chronic lymphocytic leukemia.
IMPRESSION: Head:

1. No acute intracranial abnormality.
2. Expected evolution of the now chronic right frontal lobe infarct
with new areas of linear hyperdensity at the vertex which could
reflect laminar necrosis or petechial hemorrhage.

Cervical spine:

1. No acute cervical spine fracture or traumatic listhesis.
2. Multiple bilateral prominent subcentimeter and mildly enlarged
cervical lymph nodes likely reflect patient's history of chronic
lymphocytic leukemia.

## 2023-01-23 IMAGING — DX DG CHEST 1V PORT
1 series · 1 of 1 positions shown · non-contrast
Comparison: None.

CLINICAL DATA: Syncope

EXAM:
PORTABLE CHEST 1 VIEW

[chest ap]
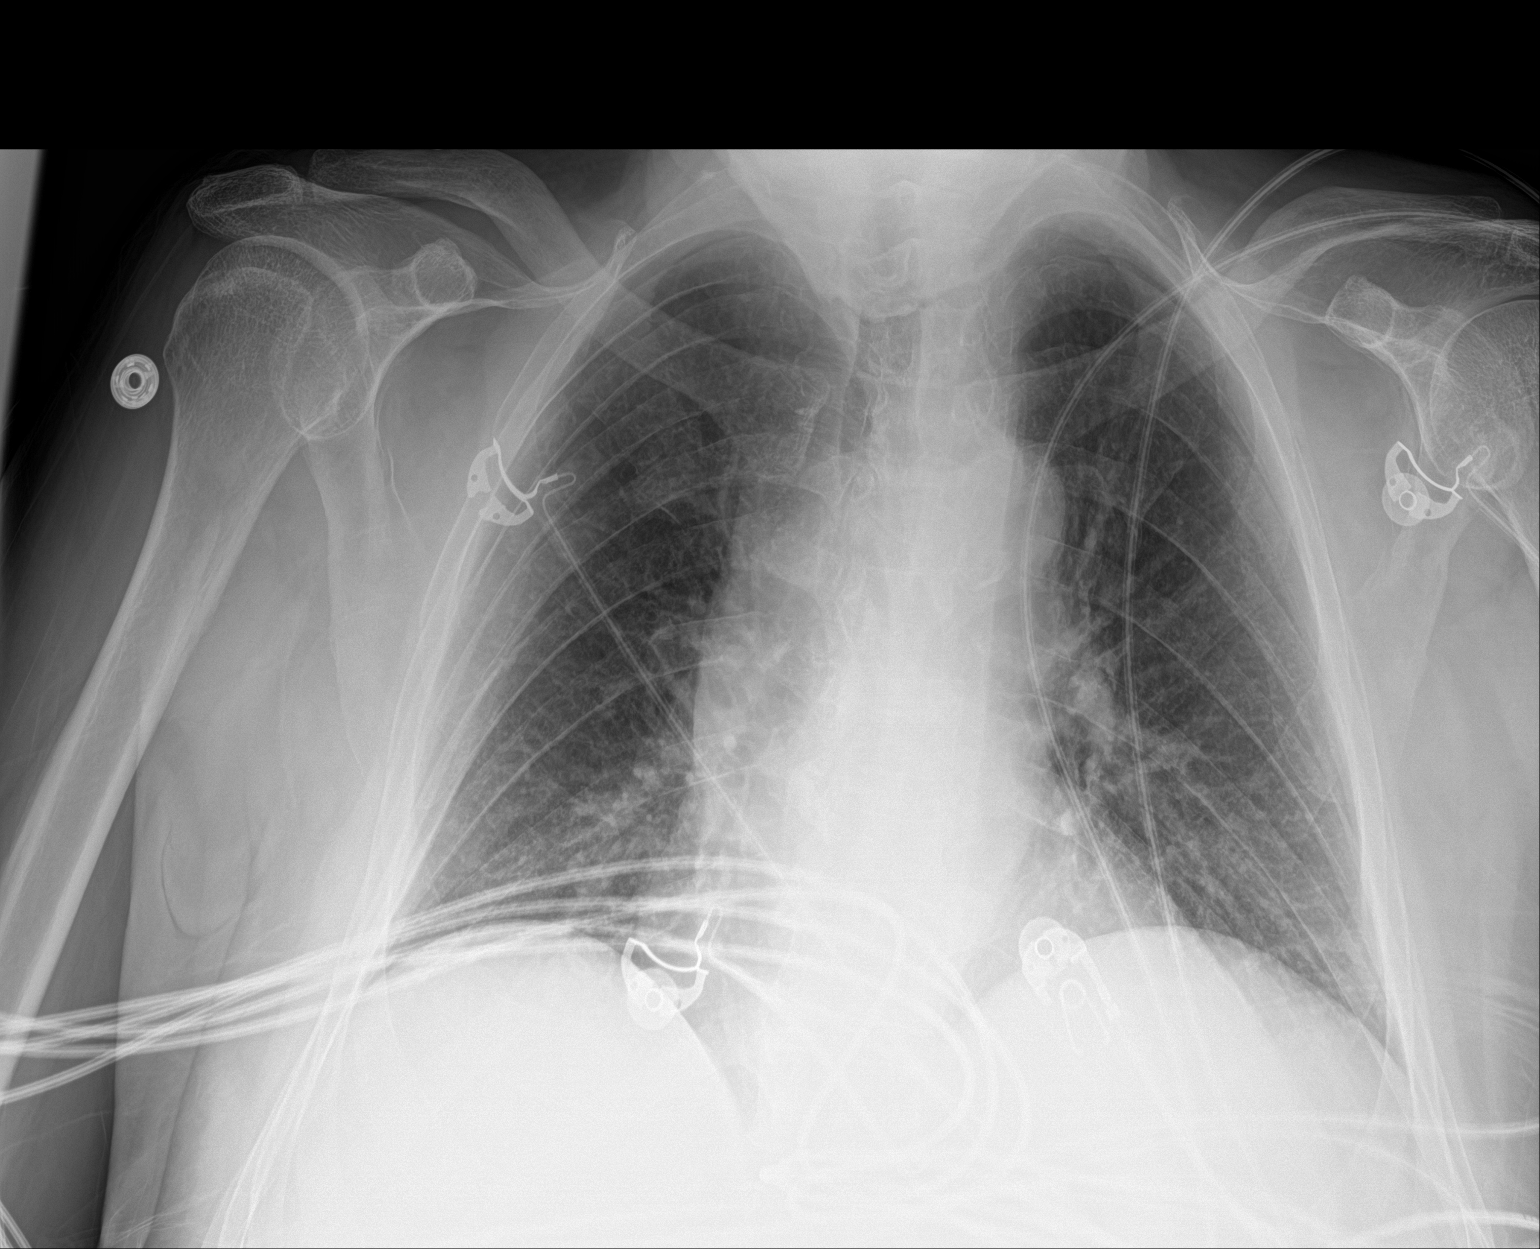

[1 of 1 positions shown; findings below may reference images not displayed]

FINDINGS: Cardiomediastinal silhouette is within normal limits. Low lung
volumes. No focal airspace consolidation. No large pleural effusion.
No visible pneumothorax. There is no acute osseous abnormality.
IMPRESSION: Low lung volumes.  No focal airspace consolidation.

## 2023-01-23 IMAGING — CT CT CERVICAL SPINE W/O CM
3 of 4 series · 12 of 35 positions shown, 14 images · non-contrast
Comparison: CT head dated January 15, 2022.

CLINICAL DATA: Fall.



[Series 4: c spine bone · axial · 0.38mm/px · z∈[+3,+131]mm · 4 of 105 slices shown, 5 images]
[im 18/105  soft-tissue]
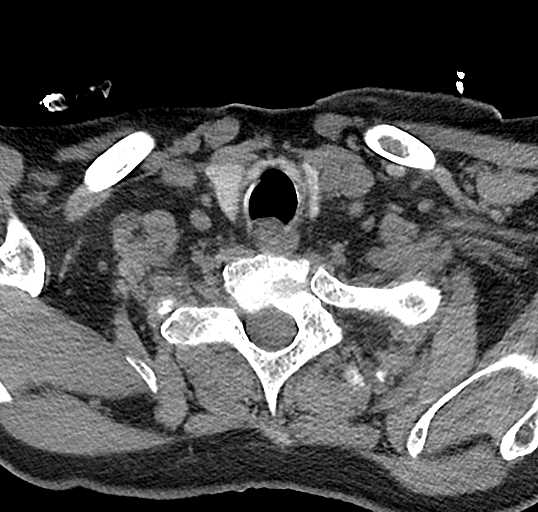
[im 18/105  bone]
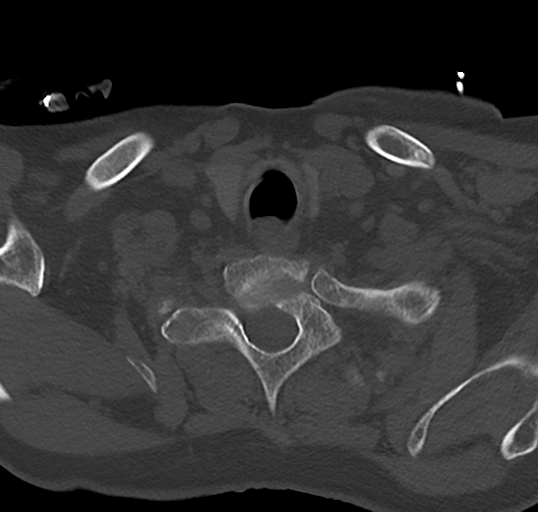
[im 35/105  bone]
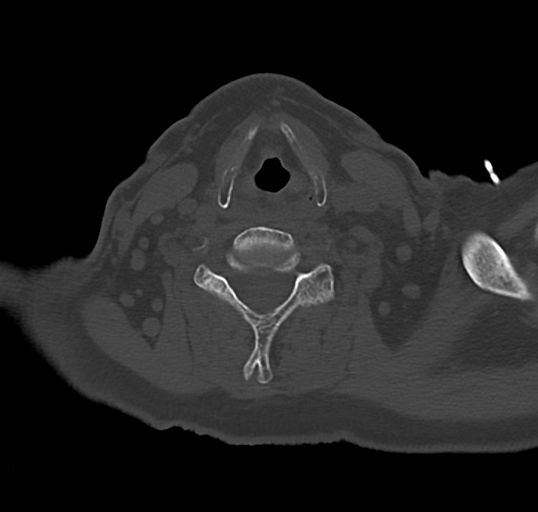
[im 70/105  bone]
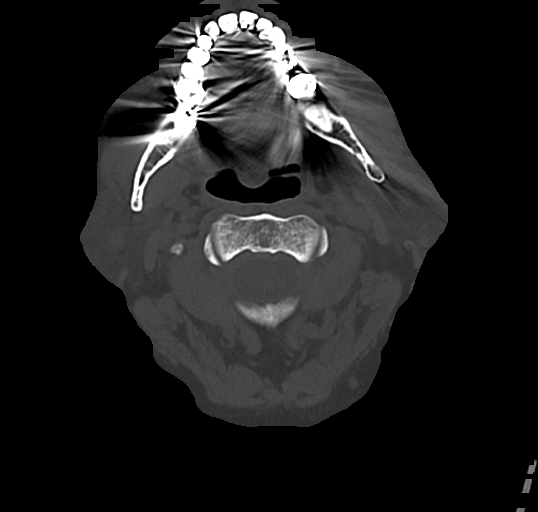
[im 87/105  bone]
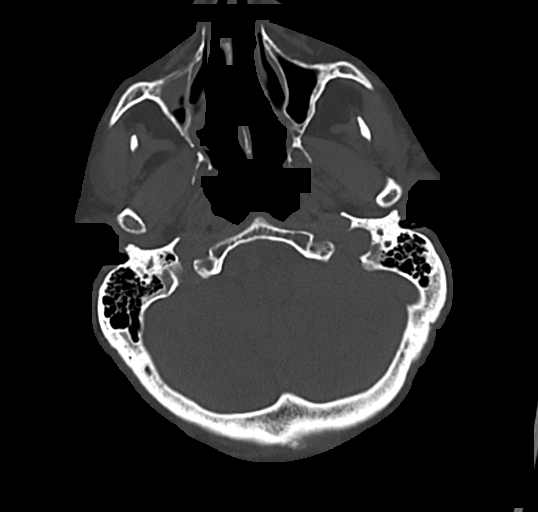

[Series 8: sag bone · sagittal · 0.31mm/px · 5 of 80 slices shown, 6 images]
[im 27/80  bone]
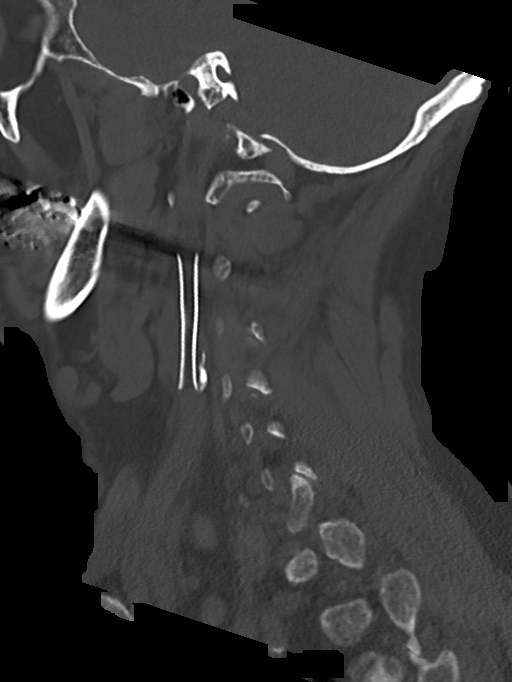
[im 33/80  bone]
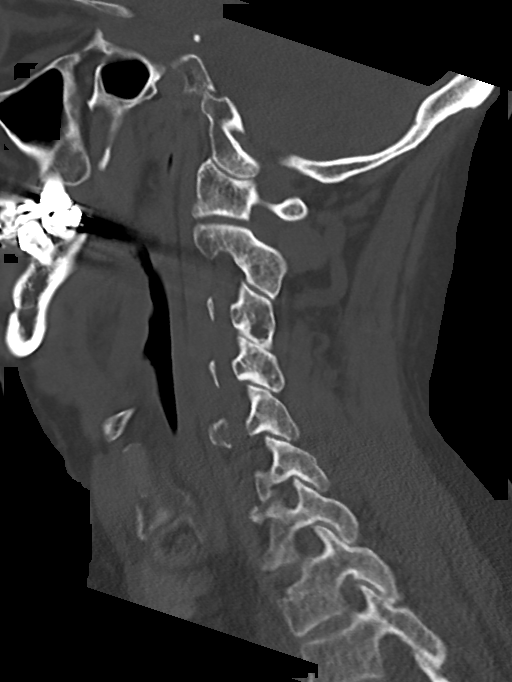
[im 40/80  soft-tissue]
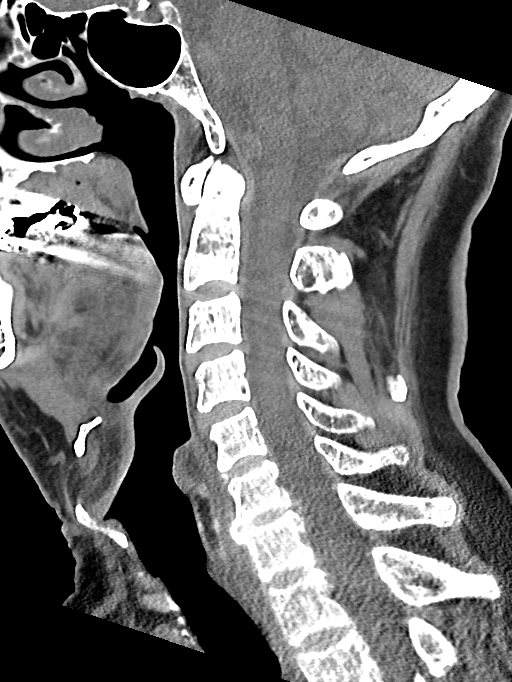
[im 40/80  bone]
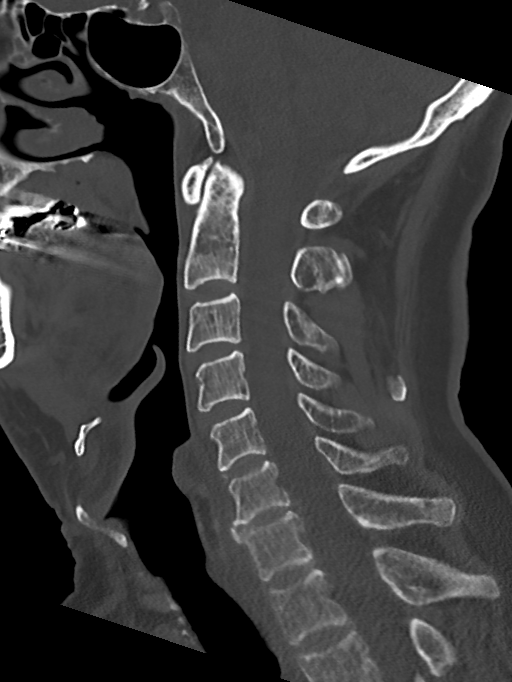
[im 47/80  bone]
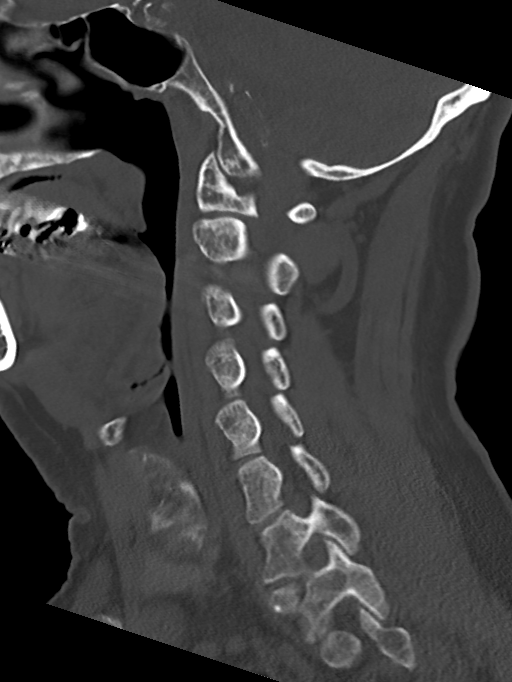
[im 53/80  bone]
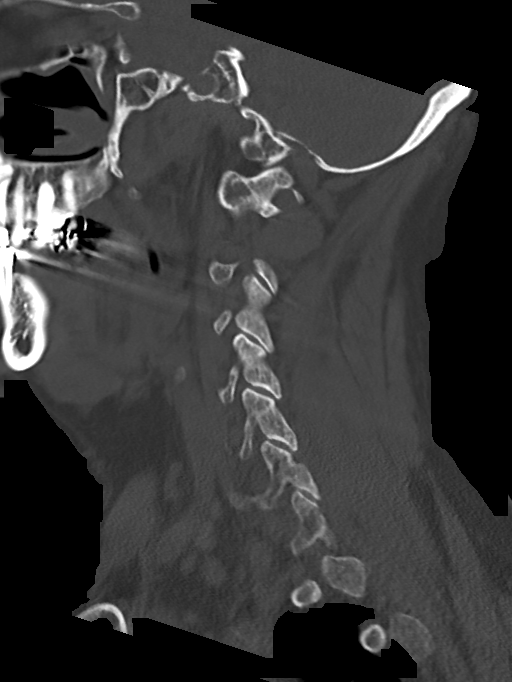

[Series 9: cor bone · coronal · 0.27mm/px · 3 of 115 slices shown]
[im 38/115  bone]
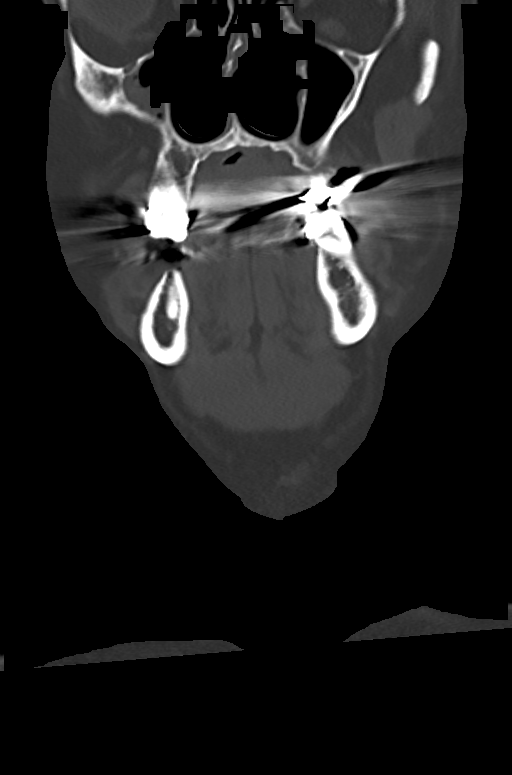
[im 51/115  bone]
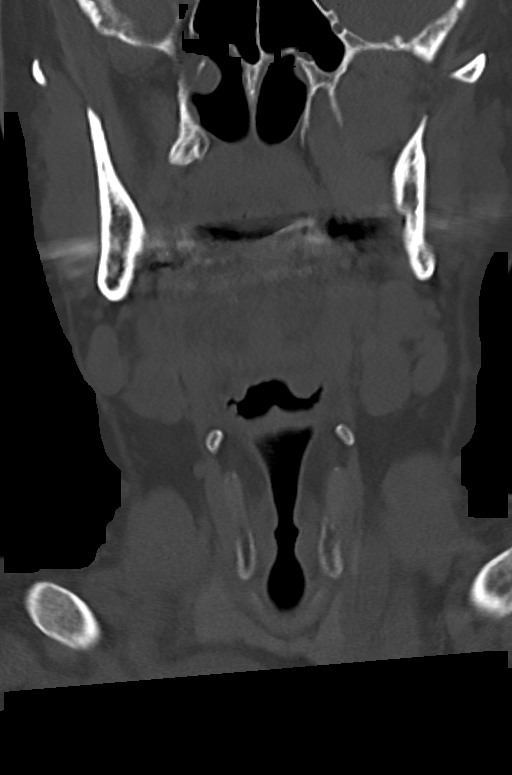
[im 64/115  bone]
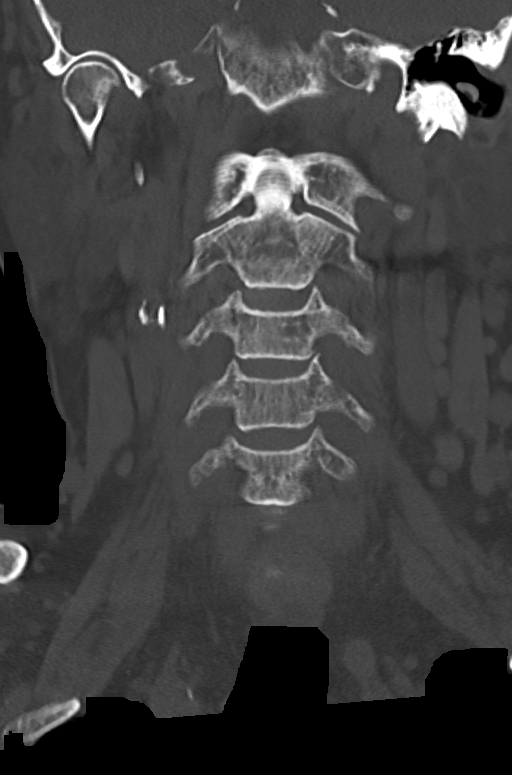

[12 of 35 positions shown; findings below may reference images not displayed]

FINDINGS: CT HEAD FINDINGS

Brain: Expected evolution of the now chronic right frontal lobe
infarct with new areas of linear hyperdensity at the vertex which
could reflect laminar necrosis or petechial hemorrhage (series 3,
image 7). No evidence of acute infarction, hydrocephalus,
extra-axial collection or mass lesion/mass effect. Stable atrophy
and chronic microvascular ischemic changes.

Vascular: Calcified atherosclerosis at the skull base. No hyperdense
vessel.

Skull: Normal. Negative for fracture or focal lesion.

Sinuses/Orbits: No acute finding.

Other: None.

CT CERVICAL SPINE FINDINGS

Alignment: Normal.

Skull base and vertebrae: No acute fracture. No primary bone lesion
or focal pathologic process.

Soft tissues and spinal canal: No prevertebral fluid or swelling. No
visible canal hematoma.

Disc levels: Mild disc height loss and uncovertebral hypertrophy at
C6-C7.

Upper chest: Negative.

Other: Multiple bilateral prominent subcentimeter and mildly
enlarged cervical lymph nodes likely reflect patient's history of
chronic lymphocytic leukemia.
IMPRESSION: Head:

1. No acute intracranial abnormality.
2. Expected evolution of the now chronic right frontal lobe infarct
with new areas of linear hyperdensity at the vertex which could
reflect laminar necrosis or petechial hemorrhage.

Cervical spine:

1. No acute cervical spine fracture or traumatic listhesis.
2. Multiple bilateral prominent subcentimeter and mildly enlarged
cervical lymph nodes likely reflect patient's history of chronic
lymphocytic leukemia.

## 2023-10-03 ENCOUNTER — Other Ambulatory Visit (HOSPITAL_COMMUNITY): Payer: Self-pay | Admitting: Family Medicine

## 2023-10-03 DIAGNOSIS — G451 Carotid artery syndrome (hemispheric): Secondary | ICD-10-CM

## 2023-10-09 DIAGNOSIS — Z20822 Contact with and (suspected) exposure to covid-19: Secondary | ICD-10-CM | POA: Diagnosis not present

## 2023-10-09 DIAGNOSIS — U071 COVID-19: Secondary | ICD-10-CM | POA: Diagnosis not present

## 2023-10-26 ENCOUNTER — Ambulatory Visit (HOSPITAL_COMMUNITY)
Admission: RE | Admit: 2023-10-26 | Discharge: 2023-10-26 | Disposition: A | Discharge status: Home / Self Care | Payer: No Typology Code available for payment source | Source: Ambulatory Visit | Attending: Family Medicine | Admitting: Family Medicine

## 2023-10-26 DIAGNOSIS — G451 Carotid artery syndrome (hemispheric): Secondary | ICD-10-CM | POA: Diagnosis present

## 2023-10-26 MED ORDER — IOHEXOL 300 MG/ML  SOLN
75.0000 mL | Freq: Once | INTRAMUSCULAR | Status: AC | PRN
  Administered 2023-10-26: 75 mL via INTRAVENOUS

## 2024-02-01 DIAGNOSIS — R051 Acute cough: Secondary | ICD-10-CM | POA: Diagnosis not present

## 2024-02-01 DIAGNOSIS — Z20822 Contact with and (suspected) exposure to covid-19: Secondary | ICD-10-CM | POA: Diagnosis not present

## 2024-02-01 DIAGNOSIS — B9689 Other specified bacterial agents as the cause of diseases classified elsewhere: Secondary | ICD-10-CM | POA: Diagnosis not present

## 2024-02-01 DIAGNOSIS — R062 Wheezing: Secondary | ICD-10-CM | POA: Diagnosis not present

## 2024-02-01 DIAGNOSIS — J988 Other specified respiratory disorders: Secondary | ICD-10-CM | POA: Diagnosis not present

## 2024-02-01 NOTE — Progress Notes (Signed)
 This nurse called and spoke with patient and his wife. Notified them that provider always called in a prescription for Prednisone. Instructions on medication administration given. Patient and patient's wife verbalized understanding.

## 2024-04-09 ENCOUNTER — Other Ambulatory Visit (HOSPITAL_COMMUNITY): Payer: Self-pay | Admitting: Family Medicine

## 2024-04-09 DIAGNOSIS — M79671 Pain in right foot: Secondary | ICD-10-CM

## 2024-04-10 ENCOUNTER — Ambulatory Visit (HOSPITAL_COMMUNITY)
Admission: RE | Admit: 2024-04-10 | Discharge: 2024-04-10 | Disposition: A | Discharge status: Home / Self Care | Source: Ambulatory Visit | Attending: Vascular Surgery | Admitting: Vascular Surgery

## 2024-04-10 DIAGNOSIS — M79672 Pain in left foot: Secondary | ICD-10-CM | POA: Diagnosis not present

## 2024-04-10 DIAGNOSIS — M79671 Pain in right foot: Secondary | ICD-10-CM | POA: Diagnosis present

## 2024-04-10 DIAGNOSIS — Z8673 Personal history of transient ischemic attack (TIA), and cerebral infarction without residual deficits: Secondary | ICD-10-CM | POA: Insufficient documentation

## 2024-04-10 DIAGNOSIS — M7989 Other specified soft tissue disorders: Secondary | ICD-10-CM | POA: Insufficient documentation

## 2024-04-10 DIAGNOSIS — E785 Hyperlipidemia, unspecified: Secondary | ICD-10-CM | POA: Diagnosis not present

## 2024-04-13 LAB — VAS US ABI WITH/WO TBI
Left ABI: 1.13
Right ABI: 1.14

## 2024-05-08 ENCOUNTER — Other Ambulatory Visit (HOSPITAL_COMMUNITY): Payer: Self-pay | Admitting: Interventional Radiology

## 2024-05-08 DIAGNOSIS — I771 Stricture of artery: Secondary | ICD-10-CM

## 2024-05-16 ENCOUNTER — Ambulatory Visit (HOSPITAL_COMMUNITY)
Admission: RE | Admit: 2024-05-16 | Discharge: 2024-05-16 | Disposition: A | Discharge status: Home / Self Care | Source: Ambulatory Visit | Attending: Interventional Radiology | Admitting: Interventional Radiology

## 2024-05-16 DIAGNOSIS — I771 Stricture of artery: Secondary | ICD-10-CM

## 2024-05-19 HISTORY — PX: IR RADIOLOGIST EVAL & MGMT: IMG5224

## 2024-05-31 ENCOUNTER — Encounter (HOSPITAL_COMMUNITY): Payer: Self-pay | Admitting: Interventional Radiology

## 2024-08-19 ENCOUNTER — Other Ambulatory Visit (HOSPITAL_COMMUNITY): Payer: Self-pay | Admitting: Radiology

## 2024-08-19 DIAGNOSIS — I771 Stricture of artery: Secondary | ICD-10-CM

## 2024-08-19 DIAGNOSIS — G451 Carotid artery syndrome (hemispheric): Secondary | ICD-10-CM

## 2024-08-25 DIAGNOSIS — M7022 Olecranon bursitis, left elbow: Secondary | ICD-10-CM | POA: Insufficient documentation

## 2024-08-25 DIAGNOSIS — H25813 Combined forms of age-related cataract, bilateral: Secondary | ICD-10-CM | POA: Insufficient documentation

## 2024-08-25 DIAGNOSIS — K257 Chronic gastric ulcer without hemorrhage or perforation: Secondary | ICD-10-CM | POA: Insufficient documentation

## 2024-08-25 DIAGNOSIS — N179 Acute kidney failure, unspecified: Secondary | ICD-10-CM | POA: Insufficient documentation

## 2024-08-25 DIAGNOSIS — R21 Rash and other nonspecific skin eruption: Secondary | ICD-10-CM | POA: Insufficient documentation

## 2024-08-25 DIAGNOSIS — I639 Cerebral infarction, unspecified: Secondary | ICD-10-CM | POA: Insufficient documentation

## 2024-08-25 DIAGNOSIS — N400 Enlarged prostate without lower urinary tract symptoms: Secondary | ICD-10-CM | POA: Insufficient documentation

## 2024-08-25 DIAGNOSIS — D509 Iron deficiency anemia, unspecified: Secondary | ICD-10-CM | POA: Insufficient documentation

## 2024-08-25 DIAGNOSIS — M25519 Pain in unspecified shoulder: Secondary | ICD-10-CM | POA: Insufficient documentation

## 2024-08-25 DIAGNOSIS — I6523 Occlusion and stenosis of bilateral carotid arteries: Secondary | ICD-10-CM | POA: Insufficient documentation

## 2024-08-25 DIAGNOSIS — I6522 Occlusion and stenosis of left carotid artery: Secondary | ICD-10-CM | POA: Insufficient documentation

## 2024-08-25 DIAGNOSIS — H259 Unspecified age-related cataract: Secondary | ICD-10-CM | POA: Insufficient documentation

## 2024-08-25 DIAGNOSIS — M792 Neuralgia and neuritis, unspecified: Secondary | ICD-10-CM | POA: Insufficient documentation

## 2024-08-25 DIAGNOSIS — Z7189 Other specified counseling: Secondary | ICD-10-CM | POA: Insufficient documentation

## 2024-08-25 DIAGNOSIS — Z0101 Encounter for examination of eyes and vision with abnormal findings: Secondary | ICD-10-CM | POA: Insufficient documentation

## 2024-08-25 DIAGNOSIS — I69854 Hemiplegia and hemiparesis following other cerebrovascular disease affecting left non-dominant side: Secondary | ICD-10-CM | POA: Insufficient documentation

## 2024-08-25 DIAGNOSIS — I6521 Occlusion and stenosis of right carotid artery: Secondary | ICD-10-CM | POA: Insufficient documentation

## 2024-08-25 DIAGNOSIS — M79673 Pain in unspecified foot: Secondary | ICD-10-CM | POA: Insufficient documentation

## 2024-08-28 ENCOUNTER — Encounter: Payer: Self-pay | Admitting: Neuroradiology

## 2024-08-28 ENCOUNTER — Ambulatory Visit (INDEPENDENT_AMBULATORY_CARE_PROVIDER_SITE_OTHER): Admitting: Neuroradiology

## 2024-08-28 VITALS — BP 152/84 | HR 88 | Ht 69.0 in | Wt 177.0 lb

## 2024-08-28 DIAGNOSIS — I6523 Occlusion and stenosis of bilateral carotid arteries: Secondary | ICD-10-CM | POA: Diagnosis not present

## 2024-08-28 DIAGNOSIS — I672 Cerebral atherosclerosis: Secondary | ICD-10-CM

## 2024-08-28 DIAGNOSIS — Z95828 Presence of other vascular implants and grafts: Secondary | ICD-10-CM

## 2024-08-28 NOTE — Progress Notes (Signed)
 I had the pleasure of meeting Daniel Osborne in the office today.  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  He has a chronic occlusion of the right internal carotid artery.  He had a left carotid stent placed by Dr. Evern 01/12/2022.  He has significant intracranial atherosclerotic disease.  He had an ultrasound follow-up of the stent 06/02/2022 with no restenosis.  He had CT arteriograms January 15, 2022 and more recently May 02, 2024 at the [facility].  I have reviewed all of the above studies.  He does have a chronic occlusion of the right internal carotid artery.  There is a moderate restenosis within the carotid stent.  He does have significant intracranial atherosclerosis.  I have asked him detailed questions about stroke, TIA and amaurosis fugax, and he is not having any new neurologic symptoms.  Blood pressure today is 152/84.  His heart rate is 88 and regular.  Lungs are clear.  He has a soft systolic murmur.  His visual fields are full.  He has mild left-sided weakness with slight drift of the left hand.  Sensation is grossly intact.  There is no ataxia or inattention.  I reviewed his medications which include ticagrelor  and 81 mg aspirin  he has been on the ticagrelor  since the stent placement.  He does not have any other stents (no coronary stents).  His other medications include atorvastatin  80 mg.  Assessment:  1.  Chronic right internal carotid artery occlusion 2.  Left carotid stent placed 01/12/2022, moderate restenosis which is asymptomatic 3.  Intracranial atherosclerosis.  Recommendation:  I do not see an indication to continue the ticagrelor  indefinitely.  I have asked him to stop taking the ticagrelor .  He should continue with 81 mg aspirin .  No routine follow-up imaging of his carotid stent needed unless he develops new symptoms.  There is no contraindication for cataract surgery.  He will follow-up with his primary care at the [facility] for management of  his hypertension.

## 2024-09-09 ENCOUNTER — Other Ambulatory Visit (HOSPITAL_COMMUNITY): Payer: Self-pay | Admitting: Family Medicine

## 2024-09-09 DIAGNOSIS — R011 Cardiac murmur, unspecified: Secondary | ICD-10-CM

## 2024-09-09 DIAGNOSIS — R609 Edema, unspecified: Secondary | ICD-10-CM

## 2024-11-06 ENCOUNTER — Telehealth: Payer: Self-pay

## 2024-11-06 NOTE — Telephone Encounter (Signed)
 Received TAVR referral and records from the [facility].  Called Pearl (coordinator on front of packet) at [phone] ext: [phone] and requested images of recent echocardiogram and cardiac catheterization are sent on CDs.  She sent a message to the Nursing team to call me directly to discuss imaging.

## 2024-11-11 NOTE — Telephone Encounter (Signed)
 Left message for Darus, RN Navigator, to call back.  Unfortunately [facility] imaging system does not accept [facility] and echo and heart catheterization will need to be mailed on CD.   Sheree's phone # [phone]

## 2024-11-12 NOTE — Telephone Encounter (Signed)
 Left message for Daniel Osborne to call back.

## 2024-11-13 NOTE — Telephone Encounter (Signed)
 Spoke with Tamera, who will send CDs of echo and heart cath to [facility]. Physical address confirmed. Will be attn: Izetta Hummer.

## 2024-11-25 ENCOUNTER — Inpatient Hospital Stay
Admission: RE | Admit: 2024-11-25 | Discharge: 2024-11-25 | Disposition: A | Discharge status: Home / Self Care | Payer: Self-pay | Source: Ambulatory Visit | Attending: Internal Medicine | Admitting: Internal Medicine

## 2024-11-25 ENCOUNTER — Other Ambulatory Visit: Payer: Self-pay

## 2024-11-25 DIAGNOSIS — I350 Nonrheumatic aortic (valve) stenosis: Secondary | ICD-10-CM

## 2024-11-26 NOTE — Telephone Encounter (Signed)
 CD uploaded to [employer] and linked to ordered in Epic for viewing.

## 2024-12-04 NOTE — Progress Notes (Signed)
 "  Patient ID: Daniel Osborne MRN: [id number] DOB/AGE: [birth year] 79 y.o.  Primary Care Physician:[facility] Primary Cardiologist: [facility])  CC:  Aortic valvular disease management     FOCUSED PROBLEM LIST:   Aortic stenosis AVA 0.71, MG 37, SVI 32, V-max 3.98, EF 65% OSH TTE December 2025 EKG 2025 sinus rhythm with right bundle branch block and left anterior fascicular block CAD Mild, nonobstructive disease coronary angiography November 2025 Hyperlipidemia Hypertension CKD stage IIIa Stroke 2023 Occluded right ICA Status post left ICA stenting Chronic lymphocytic leukemia BMI 26/BSA 1.92  January 2026:  Patient consents to use of AI scribe. The patient is 79 year old male with the above listed medical problems referred for recommendations regarding his aortic valvular disease.  The patient had reported increasing dyspnea on exertion.  An echocardiogram which I reviewed demonstrated moderate to severe aortic stenosis however at that SVI was 32 consistent with paradoxical low-flow low gradient aortic stenosis.  He had coronary angiography performed which demonstrated no significant obstructive disease.  He is here for further recommendations.  He experiences shortness of breath and fatigue, particularly during physical activities such as walking or 'weed eating'. These symptoms have persisted for approximately one year without recent worsening. They limit his ability to perform certain activities, such as rebuilding old lawn mowers, although he remains capable of driving and grocery shopping.  No lightheadedness, dizziness, or chest discomfort. He does not experience breathing difficulties while lying flat.   He mentions having to reduce his aspirin  intake due to excessive bleeding from minor cuts, but he continues to take 81 mg of aspirin  daily.  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  He has not seen a  dentist in some time but denies any dental complaints.         Past Medical History:  Diagnosis Date   Chronic lymphocytic Leukemia 01/04/2013   Hypertension     Past Surgical History:  Procedure Laterality Date   IR ANGIO EXTRACRAN SEL COM CAROTID INNOMINATE UNI BILAT MOD SED  01/12/2022   IR ANGIO VERTEBRAL SEL VERTEBRAL UNI L MOD SED  01/12/2022   IR INTRAVSC STENT CERV CAROTID W/EMB-PROT MOD SED INCL ANGIO  01/12/2022   IR RADIOLOGIST EVAL & MGMT  05/19/2024   IR US  GUIDE VASC ACCESS RIGHT  01/12/2022   RADIOLOGY WITH ANESTHESIA N/A 01/12/2022   Procedure: IR WITH ANESTHESIA;  Surgeon: Radiologist, Medication, MD;  Location: [facility] OR;  Service: Radiology;  Laterality: N/A;   TONSILLECTOMY AND ADENOIDECTOMY      Family History  Problem Relation Age of Onset   Stroke Mother    Cancer Father        leukemia   Cancer Sister        breast    Social History   Socioeconomic History   Marital status: Married    Spouse name: Not on file   Number of children: Not on file   Years of education: Not on file   Highest education level: Not on file  Occupational History   Not on file  Tobacco Use   Smoking status: Never   Smokeless tobacco: Never  Vaping Use   Vaping status: Never Used  Substance and Sexual Activity   Alcohol use: No   Drug use: No   Sexual activity: Not on file  Other Topics Concern   Not on file  Social History Narrative   Not on file   Social  Drivers of Health   Tobacco Use: Low Risk (12/12/2024)   Patient History    Smoking Tobacco Use: Never    Smokeless Tobacco Use: Never    Passive Exposure: Not on file  Financial Resource Strain: Not on file  Food Insecurity: Not on file  Transportation Needs: Not on file  Physical Activity: Not on file  Stress: Not on file  Social Connections: Not on file  Intimate Partner Violence: Not on file  Depression (EYV7-0): Not on file  Alcohol Screen: Not on file  Housing: Not on file  Utilities: Not on file  Health  Literacy: Not on file     Prior to Admission medications  Medication Sig Start Date End Date Taking? Authorizing Provider  acetaminophen  (TYLENOL ) 500 MG tablet Take 500 mg by mouth every 6 (six) hours as needed.    [provider]  aspirin  81 MG chewable tablet Chew 1 tablet (81 mg total) by mouth daily. 01/14/22   Lucila Delon BROCKS, NP  atorvastatin  (LIPITOR ) 80 MG tablet Take 1 tablet (80 mg total) by mouth daily. 01/14/22   Adhikari, Amrit, MD  Calcium  Carbonate Antacid (ALKA-SELTZER ANTACID PO) Take 2 tablets by mouth daily as needed. Mix with water    [provider]  midodrine  (PROAMATINE ) 5 MG tablet Take 1 tablet (5 mg total) by mouth 3 (three) times daily with meals. 01/18/22   Ricky Fines, MD  Multiple Vitamin (MULTIVITAMIN) tablet Take 1 tablet by mouth daily.    [provider]  psyllium (METAMUCIL) 58.6 % packet Take 1 packet by mouth daily.    [provider]  Vitamin D3 (VITAMIN D) 25 MCG tablet Take 2,000 Units by mouth daily.    [provider]    Allergies[1]  REVIEW OF SYSTEMS:  General: no fevers/chills/night sweats Eyes: no blurry vision, diplopia, or amaurosis ENT: no sore throat or hearing loss Resp: no cough, wheezing, or hemoptysis CV: no edema or palpitations GI: no abdominal pain, nausea, vomiting, diarrhea, or constipation GU: no dysuria, frequency, or hematuria Skin: no rash Neuro: no headache, numbness, tingling, or weakness of extremities Musculoskeletal: no joint pain or swelling Heme: no bleeding, DVT, or easy bruising Endo: no polydipsia or polyuria  BP (!) 170/88   Pulse 66   Ht 5' 9 (1.753 m)   Wt 174 lb 9.6 oz (79.2 kg)   SpO2 96%   BMI 25.78 kg/m   PHYSICAL EXAM: GEN:  AO x 3 in no acute distress HEENT: Normal Dentition: Normal Neck: JVP normal. +2 carotid upstrokes without bruits. No thyromegaly. Lungs: equal expansion, clear bilaterally CV: Apex is discrete and nondisplaced, RRR  with 3 out of 6 systolic ejection murmur at left upper sternal border Abd: soft, non-tender, non-distended; no bruit; positive bowel sounds Ext: no edema, ecchymoses, or cyanosis Vascular: 2+ femoral pulses, 2+ radial pulses       Skin: warm and dry without rash Neuro: CN II-XII grossly intact; motor and sensory grossly intact    DATA AND STUDIES:  EKG: 2023 sinus tachycardia right bundle branch block and left anterior fascicular block  EKG Interpretation Date/Time:  Friday December 12 2024 09:14:38 EST Ventricular Rate:  66 PR Interval:  188 QRS Duration:  142 QT Interval:  414 QTC Calculation: 434 R Axis:   -55  Text Interpretation: Normal sinus rhythm Right bundle branch block Left anterior fascicular block Bifascicular block When compared with ECG of 19-Mar-2022 14:12, PREVIOUS ECG IS PRESENT Confirmed by Wendel Haws ([id number]) on 12/12/2024 9:26:09  AM        CARDIAC STUDIES: Refer to CV Procedures and Imaging Tabs  No results found for requested labs within last 365 days.   STS RISK CALCULATOR: Pending  NYHA CLASS: 2    ASSESSMENT AND PLAN:   1. Nonrheumatic aortic valve stenosis   2. Mild CAD   3. Hyperlipidemia LDL goal <70   4. Essential hypertension   5. Stage 3a chronic kidney disease (HCC)   6. CLL (chronic lymphocytic leukemia) (HCC)   7. Cerebrovascular accident (CVA) due to stenosis of left carotid artery (HCC)   8. Bifascicular block     Aortic stenosis: I reviewed his echocardiogram with a stroke-volume index is consistent with paradoxical low-flow gradient aortic stenosis.  It is heavily calcified with restricted motion.  He has developed NYHA class II symptoms of fatigue and shortness of breath.  This is a affecting his lifestyle.  Will refer for CTA and cardiothoracic surgical opinion. Mild CAD: Continue aspirin  81 mg, atorvastatin  80 mg Hyperlipidemia: Continue atorvastatin  80 mg Hypertension: BP elevated today.  Patient did not take his lisinopril   today.  Monitor for now. CVA: Status post left ICA stenting.  Continue aspirin  81 mg, atorvastatin  80 mg CLL: Followed by other providers. Bifascicular block: Puts the patient at increased risk for pacemaker following a potential TAVR procedure.  TAVR protocol CTA will allow us  to assess his membranous septum length.   I have personally reviewed the patients imaging data as summarized above.  I have reviewed the natural history of aortic stenosis with the patient and family members who are present today. We have discussed the limitations of medical therapy and the poor prognosis associated with symptomatic aortic stenosis. We have also reviewed potential treatment options, including palliative medical therapy, conventional surgical aortic valve replacement, and transcatheter aortic valve replacement. We discussed treatment options in the context of this patient's specific comorbid medical conditions.   All of the patient's questions were answered today. Will make further recommendations based on the results of studies outlined above.   I spent 45 minutes reviewing all clinical data during and prior to this visit including all relevant imaging studies, laboratories, clinical information from other health systems and prior notes from both Cardiology and other specialties, interviewing the patient, conducting a complete physical examination, and coordinating care in order to formulate a comprehensive and personalized evaluation and treatment plan.   Michaell Grider K Cailen Texeira, MD  12/12/2024 10:02 AM    [facility] [address] [place], [place], [place]  [address] Phone: [phone]; Fax: [phone]        [1]  Allergies Allergen Reactions   Clindamycin Dermatitis   Cephalexin Dermatitis   "

## 2024-12-09 ENCOUNTER — Inpatient Hospital Stay
Admission: RE | Admit: 2024-12-09 | Discharge: 2024-12-09 | Disposition: A | Payer: Self-pay | Source: Ambulatory Visit | Attending: Neuroradiology | Admitting: Neuroradiology

## 2024-12-09 ENCOUNTER — Other Ambulatory Visit: Payer: Self-pay

## 2024-12-09 DIAGNOSIS — Z049 Encounter for examination and observation for unspecified reason: Secondary | ICD-10-CM

## 2024-12-12 ENCOUNTER — Encounter: Payer: Self-pay | Admitting: Internal Medicine

## 2024-12-12 ENCOUNTER — Ambulatory Visit: Attending: Internal Medicine | Admitting: Internal Medicine

## 2024-12-12 VITALS — BP 170/88 | HR 66 | Ht 69.0 in | Wt 174.6 lb

## 2024-12-12 DIAGNOSIS — I2510 Atherosclerotic heart disease of native coronary artery without angina pectoris: Secondary | ICD-10-CM | POA: Diagnosis not present

## 2024-12-12 DIAGNOSIS — I452 Bifascicular block: Secondary | ICD-10-CM

## 2024-12-12 DIAGNOSIS — I63232 Cerebral infarction due to unspecified occlusion or stenosis of left carotid arteries: Secondary | ICD-10-CM

## 2024-12-12 DIAGNOSIS — N1831 Chronic kidney disease, stage 3a: Secondary | ICD-10-CM

## 2024-12-12 DIAGNOSIS — C9110 Chronic lymphocytic leukemia of B-cell type not having achieved remission: Secondary | ICD-10-CM

## 2024-12-12 DIAGNOSIS — E785 Hyperlipidemia, unspecified: Secondary | ICD-10-CM

## 2024-12-12 DIAGNOSIS — I10 Essential (primary) hypertension: Secondary | ICD-10-CM | POA: Diagnosis not present

## 2024-12-12 DIAGNOSIS — I350 Nonrheumatic aortic (valve) stenosis: Secondary | ICD-10-CM | POA: Diagnosis not present

## 2024-12-12 LAB — BASIC METABOLIC PANEL WITH GFR
BUN/Creatinine Ratio: 17 (ref 10–24)
BUN: 20 mg/dL (ref 8–27)
CO2: 25 mmol/L (ref 20–29)
Calcium: 9 mg/dL (ref 8.6–10.2)
Chloride: 105 mmol/L (ref 96–106)
Creatinine, Ser: 1.16 mg/dL (ref 0.76–1.27)
Glucose: 90 mg/dL (ref 70–99)
Potassium: 4.1 mmol/L (ref 3.5–5.2)
Sodium: 145 mmol/L — ABNORMAL HIGH (ref 134–144)
eGFR: 65 mL/min/1.73

## 2024-12-12 NOTE — Progress Notes (Signed)
 Pre Surgical Assessment: 5 M Walk Test  41M=16.73ft  5 Meter Walk Test- trial 1: 4.61 seconds 5 Meter Walk Test- trial 2: 4.73 seconds 5 Meter Walk Test- trial 3: 4.3 seconds 5 Meter Walk Test Average: 4.55 seconds

## 2024-12-12 NOTE — Patient Instructions (Signed)
 Medication Instructions:  No medication changes were made at this visit. Continue current regimen.   *If you need a refill on your cardiac medications before your next appointment, please call your pharmacy*  Lab Work: To be completed today: BMP  If you have labs (blood work) drawn today and your tests are completely normal, you will receive your results only by: MyChart Message (if you have MyChart) OR A paper copy in the mail If you have any lab test that is abnormal or we need to change your treatment, we will call you to review the results.  Testing/Procedures: You will be scheduled for TAVR CT scans and a surgical consult. We will be in touch with you to get these scheduled.  Follow-Up: At [facility], you and your health needs are our priority.  As part of our continuing mission to provide you with exceptional heart care, our providers are all part of one team.  This team includes your primary Cardiologist (physician) and Advanced Practice Providers or APPs (Physician Assistants and Nurse Practitioners) who all work together to provide you with the care you need, when you need it.  Your next appointment:   Per structural heart team

## 2024-12-13 ENCOUNTER — Ambulatory Visit: Payer: Self-pay | Admitting: Internal Medicine

## 2024-12-15 ENCOUNTER — Other Ambulatory Visit: Payer: Self-pay

## 2024-12-15 DIAGNOSIS — I350 Nonrheumatic aortic (valve) stenosis: Secondary | ICD-10-CM

## 2024-12-30 ENCOUNTER — Ambulatory Visit (HOSPITAL_COMMUNITY)

## 2025-01-09 ENCOUNTER — Ambulatory Visit (HOSPITAL_COMMUNITY)

## 2025-01-12 ENCOUNTER — Ambulatory Visit (HOSPITAL_COMMUNITY)

## 2025-01-12 ENCOUNTER — Other Ambulatory Visit (HOSPITAL_COMMUNITY)

## 2025-01-16 ENCOUNTER — Encounter: Admitting: Thoracic Surgery (Cardiothoracic Vascular Surgery)
